# Patient Record
Sex: Female | Born: 1940 | Race: White | Hispanic: No | Smoking: Former smoker
Health system: Southern US, Community
[De-identification: ages and names within clinical notes are randomized; demographics above are authoritative.]

## PROBLEM LIST (undated history)

## (undated) DIAGNOSIS — L409 Psoriasis, unspecified: Secondary | ICD-10-CM

## (undated) DIAGNOSIS — E785 Hyperlipidemia, unspecified: Secondary | ICD-10-CM

## (undated) DIAGNOSIS — R945 Abnormal results of liver function studies: Secondary | ICD-10-CM

## (undated) DIAGNOSIS — K8020 Calculus of gallbladder without cholecystitis without obstruction: Secondary | ICD-10-CM

## (undated) DIAGNOSIS — I5032 Chronic diastolic (congestive) heart failure: Secondary | ICD-10-CM

## (undated) DIAGNOSIS — N390 Urinary tract infection, site not specified: Secondary | ICD-10-CM

## (undated) DIAGNOSIS — I2510 Atherosclerotic heart disease of native coronary artery without angina pectoris: Secondary | ICD-10-CM

## (undated) DIAGNOSIS — I252 Old myocardial infarction: Secondary | ICD-10-CM

## (undated) DIAGNOSIS — M810 Age-related osteoporosis without current pathological fracture: Secondary | ICD-10-CM

## (undated) DIAGNOSIS — J189 Pneumonia, unspecified organism: Secondary | ICD-10-CM

## (undated) DIAGNOSIS — I255 Ischemic cardiomyopathy: Secondary | ICD-10-CM

## (undated) DIAGNOSIS — I10 Essential (primary) hypertension: Secondary | ICD-10-CM

## (undated) HISTORY — DX: Old myocardial infarction: I25.2

## (undated) HISTORY — DX: Hyperlipidemia, unspecified: E78.5

## (undated) HISTORY — DX: Ischemic cardiomyopathy: I25.5

## (undated) HISTORY — DX: Pneumonia, unspecified organism: J18.9

## (undated) HISTORY — DX: Calculus of gallbladder without cholecystitis without obstruction: K80.20

## (undated) HISTORY — DX: Urinary tract infection, site not specified: N39.0

## (undated) HISTORY — DX: Abnormal results of liver function studies: R94.5

## (undated) HISTORY — DX: Atherosclerotic heart disease of native coronary artery without angina pectoris: I25.10

## (undated) HISTORY — DX: Chronic diastolic (congestive) heart failure: I50.32

## (undated) HISTORY — DX: Age-related osteoporosis without current pathological fracture: M81.0

## (undated) HISTORY — PX: CORONARY ARTERY BYPASS GRAFT: SHX141

## (undated) HISTORY — PX: TONSILLECTOMY: SUR1361

## (undated) HISTORY — PX: CARPAL TUNNEL RELEASE: SHX101

---

## 1969-11-27 HISTORY — PX: TUBAL LIGATION: SHX77

## 2006-08-11 ENCOUNTER — Emergency Department (HOSPITAL_COMMUNITY): Admission: EM | Admit: 2006-08-11 | Discharge: 2006-08-11 | Payer: Self-pay | Admitting: Emergency Medicine

## 2008-11-26 HISTORY — PX: WRIST FRACTURE SURGERY: SHX121

## 2012-08-24 ENCOUNTER — Encounter (HOSPITAL_COMMUNITY): Payer: Self-pay

## 2012-08-24 ENCOUNTER — Emergency Department (HOSPITAL_COMMUNITY)
Admission: EM | Admit: 2012-08-24 | Discharge: 2012-08-24 | Disposition: A | Discharge status: Home / Self Care | Payer: 59 | Source: Home / Self Care

## 2012-08-24 DIAGNOSIS — T148 Other injury of unspecified body region: Secondary | ICD-10-CM

## 2012-08-24 DIAGNOSIS — W57XXXA Bitten or stung by nonvenomous insect and other nonvenomous arthropods, initial encounter: Secondary | ICD-10-CM

## 2012-08-24 HISTORY — DX: Psoriasis, unspecified: L40.9

## 2012-08-24 MED ORDER — DOXYCYCLINE HYCLATE 100 MG PO CAPS: 100.0000 mg | ORAL_CAPSULE | Freq: Two times a day (BID) | ORAL | Status: DC

## 2012-08-24 NOTE — ED Provider Notes (Signed)
History     CSN: [id number]  Arrival date & time 08/24/12  1835   None     No chief complaint on file.   (Consider location/radiation/quality/duration/timing/severity/associated sxs/prior treatment) Patient is a 71 y.o. female presenting with rash. The history is provided by the patient. No language interpreter was used.  Rash  This is a new problem. The current episode started more than 1 week ago. The problem has been gradually worsening. Associated with: tick. The maximum temperature recorded prior to her arrival was 100 to 100.9 F. The rash is present on the trunk. The pain is moderate. The pain has been constant since onset. Associated symptoms include itching.  Pt pulled a tick off of herself.  Pt now has a red, bullseye looking rash at the area.   Pt was in [place] in August.  Dermatologist gave her a steroid cream for area but now she has a headache.  Pt reports fever yesterday  No past medical history on file.  No past surgical history on file.  No family history on file.  History  Substance Use Topics  . Smoking status: Not on file  . Smokeless tobacco: Not on file  . Alcohol Use: Not on file    OB History    No data available      Review of Systems  Skin: Positive for itching and rash.  All other systems reviewed and are negative.    Allergies  Review of patient's allergies indicates not on file.  Home Medications  No current outpatient prescriptions on file.  BP 159/93  Pulse 104  Temp 100.4 F (38 C) (Oral)  Resp 22  SpO2 98%  Physical Exam  Nursing note and vitals reviewed. Constitutional: She appears well-developed and well-nourished.  HENT:  Head: Normocephalic and atraumatic.  Neck: Normal range of motion.  Cardiovascular: Normal rate.   Pulmonary/Chest: Effort normal.  Musculoskeletal: Normal range of motion.  Neurological: She is alert.  Skin: Rash noted.       Red area under left breast,  Clearing circular pattern.  Psychiatric:  She has a normal mood and affect.    ED Course  Procedures (including critical care time)  Labs Reviewed - No data to display No results found.   1. Tick bite    Clearing could be from steroid cream.   I will treat with Doxycycline.  (no interference with metotrexate per hospital pharmacist.  RMSF and lyme titer ordered.   Pt does not have a primary care MD.  Pt given referrals by RN   MDM          Elson Areas, PA 08/24/12 1935

## 2012-08-24 NOTE — ED Notes (Signed)
States she noted a tick attached to her ant chest wall ~2 weeks ago, and since 2 days ago, has developed bulls eye-type lesion at bite site. Concern for tick borne illness

## 2012-08-25 NOTE — ED Provider Notes (Signed)
Medical screening examination/treatment/procedure(s) were performed by resident physician or non-physician practitioner and as supervising physician I was immediately available for consultation/collaboration.   KINDL,JAMES DOUGLAS MD.    James D Kindl, MD 08/25/12 1911 

## 2012-08-26 LAB — ROCKY MTN SPOTTED FVR AB, IGG-BLOOD: RMSF IgG: 1.62 IV — ABNORMAL HIGH

## 2012-08-26 LAB — B. BURGDORFI ANTIBODIES: B burgdorferi Ab IgG+IgM: 0.39 {ISR}

## 2012-08-28 ENCOUNTER — Telehealth (HOSPITAL_COMMUNITY): Payer: Self-pay | Admitting: *Deleted

## 2012-08-28 NOTE — ED Notes (Signed)
Patient called regarding her lab reports, left message on clinic answering machine. After retrieving reports and discussion w Langston Masker PA ( provider on her visit) returned call and discussed positive RMSF report, negative Lyme disease report. Was advised to continue her antibiotics as prescribed , and should be rechecked if her symptoms worsen or if new symptoms develop. Was also advised to continue in her attempts to acquire a new primary care provider for her ongoing health issues. Patient verbalized agreement and understanding of this plan

## 2012-08-28 NOTE — ED Notes (Signed)
RMSF 1.62 H pos., B. Burgdorfi IgG and IgM 0.39 neg.  Pt. adequately treated with Doxycycline.  Lab shown to [employer] PA.  She said to notify pt. To f/u with PCP or her dermatologist in 2 weeks to get titer rechecked.  If no PCP she can try Dr. Larita Fife at [facility] or Dr. Foy Guadalajara in [place]. I called pt.  Pt. verified x 2 and given results.  Pt. given instructions and voiced understanding.

## 2015-05-22 ENCOUNTER — Emergency Department (HOSPITAL_COMMUNITY): Payer: Medicare Other

## 2015-05-22 ENCOUNTER — Encounter (HOSPITAL_COMMUNITY)
Admission: EM | Disposition: A | Discharge status: Home Health | Payer: Medicare Other | Source: Home / Self Care | Attending: Thoracic Surgery (Cardiothoracic Vascular Surgery)

## 2015-05-22 ENCOUNTER — Inpatient Hospital Stay (HOSPITAL_COMMUNITY)
Admission: EM | Admit: 2015-05-22 | Discharge: 2015-05-28 | DRG: 233 | Disposition: A | Discharge status: Home Health | Payer: Medicare Other | Attending: Thoracic Surgery (Cardiothoracic Vascular Surgery) | Admitting: Thoracic Surgery (Cardiothoracic Vascular Surgery)

## 2015-05-22 ENCOUNTER — Encounter (HOSPITAL_COMMUNITY): Payer: Self-pay | Admitting: *Deleted

## 2015-05-22 DIAGNOSIS — I252 Old myocardial infarction: Secondary | ICD-10-CM | POA: Diagnosis present

## 2015-05-22 DIAGNOSIS — Z882 Allergy status to sulfonamides status: Secondary | ICD-10-CM

## 2015-05-22 DIAGNOSIS — D62 Acute posthemorrhagic anemia: Secondary | ICD-10-CM | POA: Diagnosis not present

## 2015-05-22 DIAGNOSIS — Z79899 Other long term (current) drug therapy: Secondary | ICD-10-CM

## 2015-05-22 DIAGNOSIS — Z8249 Family history of ischemic heart disease and other diseases of the circulatory system: Secondary | ICD-10-CM

## 2015-05-22 DIAGNOSIS — R000 Tachycardia, unspecified: Secondary | ICD-10-CM | POA: Diagnosis present

## 2015-05-22 DIAGNOSIS — I5041 Acute combined systolic (congestive) and diastolic (congestive) heart failure: Secondary | ICD-10-CM | POA: Diagnosis present

## 2015-05-22 DIAGNOSIS — J90 Pleural effusion, not elsewhere classified: Secondary | ICD-10-CM

## 2015-05-22 DIAGNOSIS — I2510 Atherosclerotic heart disease of native coronary artery without angina pectoris: Secondary | ICD-10-CM | POA: Diagnosis present

## 2015-05-22 DIAGNOSIS — I2102 ST elevation (STEMI) myocardial infarction involving left anterior descending coronary artery: Secondary | ICD-10-CM | POA: Diagnosis not present

## 2015-05-22 DIAGNOSIS — L409 Psoriasis, unspecified: Secondary | ICD-10-CM | POA: Diagnosis present

## 2015-05-22 DIAGNOSIS — Z951 Presence of aortocoronary bypass graft: Secondary | ICD-10-CM

## 2015-05-22 DIAGNOSIS — Z87891 Personal history of nicotine dependence: Secondary | ICD-10-CM

## 2015-05-22 DIAGNOSIS — Z7982 Long term (current) use of aspirin: Secondary | ICD-10-CM

## 2015-05-22 DIAGNOSIS — E876 Hypokalemia: Secondary | ICD-10-CM | POA: Diagnosis not present

## 2015-05-22 DIAGNOSIS — I10 Essential (primary) hypertension: Secondary | ICD-10-CM | POA: Diagnosis present

## 2015-05-22 DIAGNOSIS — I213 ST elevation (STEMI) myocardial infarction of unspecified site: Secondary | ICD-10-CM

## 2015-05-22 DIAGNOSIS — J9811 Atelectasis: Secondary | ICD-10-CM

## 2015-05-22 DIAGNOSIS — R079 Chest pain, unspecified: Secondary | ICD-10-CM | POA: Diagnosis not present

## 2015-05-22 HISTORY — PX: CARDIAC CATHETERIZATION: SHX172

## 2015-05-22 HISTORY — DX: Essential (primary) hypertension: I10

## 2015-05-22 HISTORY — PX: TEE WITHOUT CARDIOVERSION: SHX5443

## 2015-05-22 HISTORY — DX: Old myocardial infarction: I25.2

## 2015-05-22 HISTORY — PX: CORONARY ARTERY BYPASS GRAFT: SHX141

## 2015-05-22 LAB — BASIC METABOLIC PANEL
ANION GAP: 12 (ref 5–15)
BUN: 15 mg/dL (ref 6–20)
CO2: 26 mmol/L (ref 22–32)
Calcium: 9.2 mg/dL (ref 8.9–10.3)
Chloride: 102 mmol/L (ref 101–111)
Creatinine, Ser: 0.95 mg/dL (ref 0.44–1.00)
GFR calc Af Amer: >60 mL/min (ref >60)
GFR calc non Af Amer: 58 mL/min — ABNORMAL LOW (ref >60)
Glucose, Bld: 105 mg/dL — ABNORMAL HIGH (ref 65–99)
Potassium: 3.2 mmol/L — ABNORMAL LOW (ref 3.5–5.1)
Sodium: 140 mmol/L (ref 135–145)

## 2015-05-22 LAB — CBC
HEMATOCRIT: 37.7 % (ref 36.0–46.0)
HEMATOCRIT: 40.4 % (ref 36.0–46.0)
Hemoglobin: 12.4 g/dL (ref 12.0–15.0)
Hemoglobin: 13.1 g/dL (ref 12.0–15.0)
MCH: 26.9 pg (ref 26.0–34.0)
MCH: 27.1 pg (ref 26.0–34.0)
MCHC: 32.4 g/dL (ref 30.0–36.0)
MCHC: 32.9 g/dL (ref 30.0–36.0)
MCV: 82.3 fL (ref 78.0–100.0)
MCV: 83 fL (ref 78.0–100.0)
Platelets: 260 10*3/uL (ref 150–400)
Platelets: 281 10*3/uL (ref 150–400)
RBC: 4.58 MIL/uL (ref 3.87–5.11)
RBC: 4.87 MIL/uL (ref 3.87–5.11)
RDW: 12.9 % (ref 11.5–15.5)
RDW: 12.9 % (ref 11.5–15.5)
WBC: 7 10*3/uL (ref 4.0–10.5)
WBC: 8.3 10*3/uL (ref 4.0–10.5)

## 2015-05-22 LAB — I-STAT TROPONIN, ED: TROPONIN I, POC: 0.01 ng/mL (ref 0.00–0.08)

## 2015-05-22 LAB — TROPONIN I: Troponin I: <0.03 ng/mL (ref <0.031)

## 2015-05-22 SURGERY — LEFT HEART CATH AND CORONARY ANGIOGRAPHY

## 2015-05-22 SURGERY — CORONARY ARTERY BYPASS GRAFTING (CABG)
Anesthesia: General | Site: Chest

## 2015-05-22 MED ORDER — VERAPAMIL HCL 2.5 MG/ML IV SOLN
INTRAVENOUS | Status: AC
  Filled 2015-05-22: qty 2

## 2015-05-22 MED ORDER — METOPROLOL TARTRATE 1 MG/ML IV SOLN
INTRAVENOUS | Status: DC | PRN
  Administered 2015-05-22 (×2): 5 mg via INTRAVENOUS

## 2015-05-22 MED ORDER — IOHEXOL 350 MG/ML SOLN
INTRAVENOUS | Status: DC | PRN
  Administered 2015-05-22: 110 mL via INTRA_ARTERIAL

## 2015-05-22 MED ORDER — SODIUM CHLORIDE 0.9 % IV SOLN
250.0000 mg | INTRAVENOUS | Status: DC | PRN
  Administered 2015-05-22: 1.75 mg/kg/h via INTRAVENOUS

## 2015-05-22 MED ORDER — FENTANYL CITRATE (PF) 100 MCG/2ML IJ SOLN
INTRAMUSCULAR | Status: DC | PRN
  Administered 2015-05-22: 50 ug via INTRAVENOUS

## 2015-05-22 MED ORDER — NITROGLYCERIN IN D5W 200-5 MCG/ML-% IV SOLN
10.0000 ug/min | INTRAVENOUS | Status: DC
  Administered 2015-05-22: 5 ug/min via INTRAVENOUS
  Filled 2015-05-22: qty 250

## 2015-05-22 MED ORDER — METOPROLOL TARTRATE 1 MG/ML IV SOLN
INTRAVENOUS | Status: AC
  Filled 2015-05-22: qty 10

## 2015-05-22 MED ORDER — BIVALIRUDIN 250 MG IV SOLR
INTRAVENOUS | Status: AC
  Filled 2015-05-22: qty 250

## 2015-05-22 MED ORDER — VERAPAMIL HCL 2.5 MG/ML IV SOLN
INTRAVENOUS | Status: DC | PRN
  Administered 2015-05-22: 23:00:00 via INTRA_ARTERIAL

## 2015-05-22 MED ORDER — LIDOCAINE HCL (PF) 1 % IJ SOLN
INTRAMUSCULAR | Status: AC
  Filled 2015-05-22: qty 30

## 2015-05-22 MED ORDER — HEPARIN (PORCINE) IN NACL 2-0.9 UNIT/ML-% IJ SOLN
INTRAMUSCULAR | Status: AC
  Filled 2015-05-22: qty 1500

## 2015-05-22 MED ORDER — FENTANYL CITRATE (PF) 100 MCG/2ML IJ SOLN
INTRAMUSCULAR | Status: AC
  Filled 2015-05-22: qty 2

## 2015-05-22 MED ORDER — MIDAZOLAM HCL 2 MG/2ML IJ SOLN
INTRAMUSCULAR | Status: AC
  Filled 2015-05-22: qty 2

## 2015-05-22 MED ORDER — ASPIRIN 81 MG PO CHEW
324.0000 mg | CHEWABLE_TABLET | Freq: Once | ORAL | Status: AC
  Administered 2015-05-22: 324 mg via ORAL
  Filled 2015-05-22: qty 4

## 2015-05-22 MED ORDER — HEPARIN SODIUM (PORCINE) 5000 UNIT/ML IJ SOLN
4000.0000 [IU] | Freq: Once | INTRAMUSCULAR | Status: AC
  Administered 2015-05-22: 4000 [IU] via INTRAVENOUS

## 2015-05-22 MED ORDER — MIDAZOLAM HCL 2 MG/2ML IJ SOLN
INTRAMUSCULAR | Status: DC | PRN
  Administered 2015-05-22 (×2): 1 mg via INTRAVENOUS

## 2015-05-22 MED ORDER — HEPARIN SODIUM (PORCINE) 5000 UNIT/ML IJ SOLN
INTRAMUSCULAR | Status: AC
  Filled 2015-05-22: qty 1

## 2015-05-22 MED ORDER — BIVALIRUDIN BOLUS VIA INFUSION - CUPID
INTRAVENOUS | Status: DC | PRN
  Administered 2015-05-22: 45.225 mg via INTRAVENOUS

## 2015-05-22 MED ORDER — NITROGLYCERIN 1 MG/10 ML FOR IR/CATH LAB
INTRA_ARTERIAL | Status: AC
  Filled 2015-05-22: qty 10

## 2015-05-22 MED ORDER — LIDOCAINE HCL (PF) 1 % IJ SOLN
INTRAMUSCULAR | Status: DC | PRN
  Administered 2015-05-22: 5 mL via SUBCUTANEOUS

## 2015-05-22 SURGICAL SUPPLY — 77 items
BAG DECANTER FOR FLEXI CONT (MISCELLANEOUS) ×3 IMPLANT
BANDAGE ELASTIC 4 VELCRO ST LF (GAUZE/BANDAGES/DRESSINGS) ×3 IMPLANT
BANDAGE ELASTIC 6 VELCRO ST LF (GAUZE/BANDAGES/DRESSINGS) ×3 IMPLANT
BASKET HEART (ORDER IN 25'S) (MISCELLANEOUS) ×1
BASKET HEART (ORDER IN 25S) (MISCELLANEOUS) ×2 IMPLANT
BLADE STERNUM SYSTEM 6 (BLADE) ×3 IMPLANT
BNDG GAUZE ELAST 4 BULKY (GAUZE/BANDAGES/DRESSINGS) ×3 IMPLANT
CANISTER SUCTION 2500CC (MISCELLANEOUS) ×3 IMPLANT
CANNULA EZ GLIDE AORTIC 21FR (CANNULA) ×3 IMPLANT
CATH CPB KIT HENDRICKSON (MISCELLANEOUS) ×3 IMPLANT
CATH ROBINSON RED A/P 18FR (CATHETERS) ×3 IMPLANT
CATH THORACIC 36FR (CATHETERS) ×3 IMPLANT
CATH THORACIC 36FR RT ANG (CATHETERS) ×3 IMPLANT
CLIP TI MEDIUM 24 (CLIP) IMPLANT
CLIP TI WIDE RED SMALL 24 (CLIP) ×6 IMPLANT
CRADLE DONUT ADULT HEAD (MISCELLANEOUS) ×3 IMPLANT
DRAPE CARDIOVASCULAR INCISE (DRAPES) ×1
DRAPE SLUSH/WARMER DISC (DRAPES) ×3 IMPLANT
DRAPE SRG 135X102X78XABS (DRAPES) ×2 IMPLANT
DRSG COVADERM 4X14 (GAUZE/BANDAGES/DRESSINGS) ×3 IMPLANT
ELECT REM PT RETURN 9FT ADLT (ELECTROSURGICAL) ×6
ELECTRODE REM PT RTRN 9FT ADLT (ELECTROSURGICAL) ×4 IMPLANT
GAUZE SPONGE 4X4 12PLY STRL (GAUZE/BANDAGES/DRESSINGS) ×6 IMPLANT
GLOVE SURG SIGNA 7.5 PF LTX (GLOVE) ×9 IMPLANT
GOWN STRL REUS W/ TWL LRG LVL3 (GOWN DISPOSABLE) ×8 IMPLANT
GOWN STRL REUS W/ TWL XL LVL3 (GOWN DISPOSABLE) ×4 IMPLANT
GOWN STRL REUS W/TWL LRG LVL3 (GOWN DISPOSABLE) ×4
GOWN STRL REUS W/TWL XL LVL3 (GOWN DISPOSABLE) ×2
HEMOSTAT POWDER SURGIFOAM 1G (HEMOSTASIS) ×9 IMPLANT
HEMOSTAT SURGICEL 2X14 (HEMOSTASIS) ×3 IMPLANT
INSERT FOGARTY XLG (MISCELLANEOUS) IMPLANT
KIT BASIN OR (CUSTOM PROCEDURE TRAY) ×3 IMPLANT
KIT ROOM TURNOVER OR (KITS) ×3 IMPLANT
KIT SUCTION CATH 14FR (SUCTIONS) ×6 IMPLANT
KIT VASOVIEW W/TROCAR VH 2000 (KITS) ×3 IMPLANT
MARKER GRAFT CORONARY BYPASS (MISCELLANEOUS) ×9 IMPLANT
NS IRRIG 1000ML POUR BTL (IV SOLUTION) ×15 IMPLANT
PACK OPEN HEART (CUSTOM PROCEDURE TRAY) ×3 IMPLANT
PAD ARMBOARD 7.5X6 YLW CONV (MISCELLANEOUS) ×6 IMPLANT
PAD ELECT DEFIB RADIOL ZOLL (MISCELLANEOUS) ×3 IMPLANT
PENCIL BUTTON HOLSTER BLD 10FT (ELECTRODE) ×3 IMPLANT
PUNCH AORTIC ROTATE 4.0MM (MISCELLANEOUS) IMPLANT
PUNCH AORTIC ROTATE 4.5MM 8IN (MISCELLANEOUS) IMPLANT
PUNCH AORTIC ROTATE 5MM 8IN (MISCELLANEOUS) IMPLANT
SET CARDIOPLEGIA MPS 5001102 (MISCELLANEOUS) ×3 IMPLANT
SPONGE GAUZE 4X4 12PLY STER LF (GAUZE/BANDAGES/DRESSINGS) ×6 IMPLANT
SUT BONE WAX W31G (SUTURE) ×3 IMPLANT
SUT MNCRL AB 4-0 PS2 18 (SUTURE) ×6 IMPLANT
SUT PROLENE 3 0 SH DA (SUTURE) ×3 IMPLANT
SUT PROLENE 4 0 RB 1 (SUTURE)
SUT PROLENE 4 0 SH DA (SUTURE) IMPLANT
SUT PROLENE 4-0 RB1 .5 CRCL 36 (SUTURE) IMPLANT
SUT PROLENE 6 0 C 1 30 (SUTURE) ×6 IMPLANT
SUT PROLENE 7 0 BV1 MDA (SUTURE) ×3 IMPLANT
SUT PROLENE 8 0 BV175 6 (SUTURE) IMPLANT
SUT STEEL 6MS V (SUTURE) ×3 IMPLANT
SUT STEEL STERNAL CCS#1 18IN (SUTURE) IMPLANT
SUT STEEL SZ 6 DBL 3X14 BALL (SUTURE) ×3 IMPLANT
SUT VIC AB 1 CTX 36 (SUTURE) ×2
SUT VIC AB 1 CTX36XBRD ANBCTR (SUTURE) ×4 IMPLANT
SUT VIC AB 2-0 CT1 27 (SUTURE) ×2
SUT VIC AB 2-0 CT1 TAPERPNT 27 (SUTURE) ×4 IMPLANT
SUT VIC AB 2-0 CTX 27 (SUTURE) IMPLANT
SUT VIC AB 3-0 SH 27 (SUTURE)
SUT VIC AB 3-0 SH 27X BRD (SUTURE) IMPLANT
SUT VIC AB 3-0 X1 27 (SUTURE) IMPLANT
SUT VICRYL 4-0 PS2 18IN ABS (SUTURE) IMPLANT
SUTURE E-PAK OPEN HEART (SUTURE) ×3 IMPLANT
SYSTEM SAHARA CHEST DRAIN ATS (WOUND CARE) ×3 IMPLANT
TAPE CLOTH SURG 4X10 WHT LF (GAUZE/BANDAGES/DRESSINGS) ×3 IMPLANT
TOWEL OR 17X24 6PK STRL BLUE (TOWEL DISPOSABLE) ×6 IMPLANT
TOWEL OR 17X26 10 PK STRL BLUE (TOWEL DISPOSABLE) ×6 IMPLANT
TRAY FOLEY IC TEMP SENS 16FR (CATHETERS) ×3 IMPLANT
TUBE FEEDING 8FR 16IN STR KANG (MISCELLANEOUS) ×3 IMPLANT
TUBING INSUFFLATION (TUBING) ×3 IMPLANT
UNDERPAD 30X30 INCONTINENT (UNDERPADS AND DIAPERS) ×3 IMPLANT
WATER STERILE IRR 1000ML POUR (IV SOLUTION) ×6 IMPLANT

## 2015-05-22 SURGICAL SUPPLY — 12 items
CATH INFINITI 5 FR JL3.5 (CATHETERS) ×3 IMPLANT
CATH INFINITI 5FR ANG PIGTAIL (CATHETERS) ×3 IMPLANT
CATH INFINITI JR4 5F (CATHETERS) ×3 IMPLANT
GLIDESHEATH SLEND A-KIT 6F 22G (SHEATH) ×3 IMPLANT
KIT ENCORE 26 ADVANTAGE (KITS) ×3 IMPLANT
KIT HEART LEFT (KITS) ×3 IMPLANT
PACK CARDIAC CATHETERIZATION (CUSTOM PROCEDURE TRAY) ×3 IMPLANT
SYR MEDRAD MARK V 150ML (SYRINGE) ×3 IMPLANT
TRANSDUCER W/STOPCOCK (MISCELLANEOUS) ×3 IMPLANT
TUBING CIL FLEX 10 FLL-RA (TUBING) ×3 IMPLANT
WIRE HI TORQ VERSACORE-J 145CM (WIRE) ×3 IMPLANT
WIRE SAFE-T 1.5MM-J .035X260CM (WIRE) ×3 IMPLANT

## 2015-05-22 NOTE — ED Notes (Signed)
Cardiology at bedside.  Transport to cath lab.

## 2015-05-22 NOTE — ED Provider Notes (Signed)
CSN: [id number]     Arrival date & time 05/22/15  2210 History   First MD Initiated Contact with Patient 05/22/15 2220     Chief Complaint  Patient presents with  . Chest Pain     (Consider location/radiation/quality/duration/timing/severity/associated sxs/prior Treatment) Patient is a 74 y.o. female presenting with chest pain. The history is provided by the patient.  Chest Pain Pain location:  Substernal area Pain quality: pressure   Pain radiates to:  L shoulder and L arm Pain radiates to the back: no   Pain severity:  Moderate Onset quality:  Gradual Timing:  Constant Progression:  Unchanged Chronicity:  New Context: at rest   Relieved by:  Nothing Worsened by:  Nothing tried Associated symptoms: no cough, no fever and no shortness of breath     Past Medical History  Diagnosis Date  . Psoriasis    Past Surgical History  Procedure Laterality Date  . Tubal ligation    . Carpal tunnel release    . Tonsillectomy     History reviewed. No pertinent family history. History  Substance Use Topics  . Smoking status: Never Smoker   . Smokeless tobacco: Not on file  . Alcohol Use: No   OB History    No data available     Review of Systems  Constitutional: Negative for fever.  Respiratory: Negative for cough and shortness of breath.   Cardiovascular: Positive for chest pain.  All other systems reviewed and are negative.     Allergies  Avelox; Codeine; and Sulfa antibiotics  Home Medications   Prior to Admission medications   Medication Sig Start Date End Date Taking? Authorizing Provider  doxycycline (VIBRAMYCIN) 100 MG capsule Take 1 capsule (100 mg total) by mouth 2 (two) times daily. 08/24/12   Elson Areas, PA-C  methotrexate (RHEUMATREX) 10 MG tablet Take 10 mg by mouth. Caution: Chemotherapy. Protect from light.    Historical Provider, MD  methotrexate (RHEUMATREX) 2.5 MG tablet Take 2.5 mg by mouth once a week. Caution:Chemotherapy. Protect from light.     Historical Provider, MD   BP 186/103 mmHg  Pulse 106  Resp 30  Ht  (1.575 m)  Wt 133 lb (60.328 kg)  BMI 24.32 kg/m2  SpO2 100% Physical Exam  Constitutional: She is oriented to person, place, and time. She appears well-developed and well-nourished. No distress.  HENT:  Head: Normocephalic and atraumatic.  Mouth/Throat: Oropharynx is clear and moist.  Eyes: EOM are normal. Pupils are equal, round, and reactive to light.  Neck: Normal range of motion. Neck supple.  Cardiovascular: Normal rate and regular rhythm.  Exam reveals no friction rub.   No murmur heard. Pulmonary/Chest: Effort normal and breath sounds normal. No respiratory distress. She has no wheezes. She has no rales.  Abdominal: Soft. She exhibits no distension. There is no tenderness. There is no rebound.  Musculoskeletal: Normal range of motion. She exhibits no edema.  Neurological: She is alert and oriented to person, place, and time.  Skin: No rash noted. She is not diaphoretic.  Nursing note and vitals reviewed.   ED Course  Procedures (including critical care time) Labs Review Labs Reviewed  CBC  BASIC METABOLIC PANEL  TROPONIN I  I-STAT TROPOININ, ED  I-STAT TROPOININ, ED    Imaging Review No results found.   EKG Interpretation   Date/Time:  Saturday May 22 2015 22:13:46 EDT Ventricular Rate:  103 PR Interval:  164 QRS Duration: 94 QT Interval:  358 QTC Calculation: 468  R Axis:   70 Text Interpretation:  Sinus tachycardia ST elevation, consider early  repolarization, pericarditis, or injury Abnormal ECG No prior for  comparison Confirmed by [facility]  MD, Farris Geiman ([id number]) on 05/22/2015 10:21:08 PM      MDM   Final diagnoses:  ST elevation myocardial infarction (STEMI), unspecified artery    2226 - Patient here with chest pain, pressure-like - radiating to her L lateral chest. Associated SOB. No cardiac history. Initial EKG with concern for possible lateral elevation. Repeat 6 minutes  later read as lateral infarction. With her story c/w ACS and I and aVL elevation, CODE STEMI called. Aspirin and heparin given.  Patient feeling much better after heparin and nitroglycerin.  Patient taken to cath lab with Cardiology.  Elwin Mocha, MD 05/22/15 [phone]

## 2015-05-22 NOTE — ED Notes (Signed)
[facility] has been called for stemi.

## 2015-05-22 NOTE — ED Notes (Signed)
EKG Shown to Dr. Micheline Maze, and Dr. Gwendolyn Grant. Repeat EKG requested by Dr. Gwendolyn Grant.

## 2015-05-22 NOTE — H&P (Signed)
CARDIOLOGY INPATIENT HISTORY AND PHYSICAL EXAMINATION NOTE  Patient ID: Yareth Kearse MRN: [id number], DOB/AGE: 07-03-1941   Admit date: 05/22/2015   Primary Physician: No primary care provider on file. Primary Cardiologist: none Primary care physician: Dr. Cathey Endow  Reason for admission: Chest pain/anterolateral STEMI  HPI: This is a 74 y.o. female with no history of CAD but has hypertension, psoriasis, former tobacco abuse (quit 14 y ago 40 pack years) who presented with chest pain. The chest pain started 1 hour ago when patient folded a twin bed. In the afternoon around 5pm, she was having some tiredness and felt very fatigued. The chest pain is dull and hard, present int he center of the chest, with radiation to the left arm and is associated with diaphoresis and SOB. patietn denied any priro MIs. She is able to walk 1 mile every day and does not suffer from any pain. She has been on humira in the past and stopped for psoriasis 6 months ago. She is allergic to sulfa and codeine/avelox. She is currently on methotrexate.  In the Ed, the vitals were: BP 183/113 mmHg  Pulse 96  Resp 23  Ht  (1.575 m)  Wt 60.328 kg (133 lb)  BMI 24.32 kg/m2  SpO2 97% patient had ST elevation in AVL for 0.5 ml. Otherwise she was hemodynamically stable. She has been former Engineer, civil (consulting) in the OR. Her daughter is accompanying her. She performs her own ADLs and daughter lives with her.  Patient had prior bilateral wrist fractures.    Problem List: Past Medical History  Diagnosis Date  . Psoriasis   . Essential hypertension     Past Surgical History  Procedure Laterality Date  . Tubal ligation    . Carpal tunnel release    . Tonsillectomy       Allergies:  Allergies  Allergen Reactions  . Avelox [Moxifloxacin]   . Codeine   . Sulfa Antibiotics      Home Medications Current Facility-Administered Medications  Medication Dose Route Frequency Provider Last Rate Last Dose  . bivalirudin (ANGIOMAX)  250 mg in sodium chloride 0.9 % 50 mL (5 mg/mL) infusion  250 mg  Continuous PRN Lyn Records, MD 21.1 mL/hr at 05/22/15 2317 1.75 mg/kg/hr at 05/22/15 2317  . bivalirudin (ANGIOMAX) LOAD via infusion    PRN Lyn Records, MD   45.225 mg at 05/22/15 2315  . fentaNYL (SUBLIMAZE) injection    PRN Lyn Records, MD   50 mcg at 05/22/15 2308  . heparin 5000 UNIT/ML injection           . lidocaine (PF) (XYLOCAINE) 1 % injection    PRN Lyn Records, MD   5 mL at 05/22/15 2313  . metoprolol (LOPRESSOR) injection    PRN Lyn Records, MD   5 mg at 05/22/15 2337  . midazolam (VERSED) injection    PRN Lyn Records, MD   1 mg at 05/22/15 2314  . [MAR Hold] nitroGLYCERIN 50 mg in dextrose 5 % 250 mL (0.2 mg/mL) infusion  10-200 mcg/min Intravenous Titrated Elwin Mocha, MD 15 mL/hr at 05/22/15 2336 50 mcg/min at 05/22/15 2336  . Radial Cocktail    PRN Lyn Records, MD         Family History  Problem Relation Age of Onset  . Heart attack      Grandfather  . Heart disease Father      History   Social History  . Marital Status: Married  Spouse Name: N/A  . Number of Children: N/A  . Years of Education: N/A   Occupational History  . retired Engineer, civil (consulting)    Social History Main Topics  . Smoking status: Former Smoker    Quit date: 05/21/2001  . Smokeless tobacco: Never Used  . Alcohol Use: No  . Drug Use: No  . Sexual Activity: Not on file   Other Topics Concern  . Not on file   Social History Narrative   Lives with her daughter, takes care of her own ADLs and iADLs     Review of Systems: General: diaphoresis and fatigue  Cardiovascular: chest pain, dyspnea negative for dyspnea on exertion, edema, orthopnea, palpitations, paroxysmal nocturnal PND Dermatological: psoriatic rash Respiratory: negative for cough or wheezing Urologic: negative for hematuria Abdominal: negative for nausea, vomiting, diarrhea, bright red blood per rectum, melena, or hematemesis Neurologic: negative for  visual changes, syncope, or dizziness  Physical Exam: Vitals: BP 183/113 mmHg  Pulse 96  Resp 23  Ht 5\' 2"  (1.575 m)  Wt 60.328 kg (133 lb)  BMI 24.32 kg/m2  SpO2 97% General: not in acute distress Neck: JVP <8 cm, neck supple Heart: regular rate and rhythm, S1, S2, no murmurs/ has S3 Lungs: CTAB  GI: non tender, non distended, bowel sounds present Extremities: no edema Neuro: AAO x 3  Psych: normal affect, no anxiety Skin: psoriatic plaques all over the body with scaly erythmatous rash   Labs:   Results for orders placed or performed during the hospital encounter of 05/22/15 (from the past 24 hour(s))  CBC     Status: None   Collection Time: 05/22/15 10:19 PM  Result Value Ref Range   WBC 7.0 4.0 - 10.5 K/uL   RBC 4.87 3.87 - 5.11 MIL/uL   Hemoglobin 13.1 12.0 - 15.0 g/dL   HCT 59.9 35.7 - 01.7 %   MCV 83.0 78.0 - 100.0 fL   MCH 26.9 26.0 - 34.0 pg   MCHC 32.4 30.0 - 36.0 g/dL   RDW 79.3 90.3 - 00.9 %   Platelets 281 150 - 400 K/uL  Basic metabolic panel     Status: Abnormal   Collection Time: 05/22/15 10:19 PM  Result Value Ref Range   Sodium 140 135 - 145 mmol/L   Potassium 3.2 (L) 3.5 - 5.1 mmol/L   Chloride 102 101 - 111 mmol/L   CO2 26 22 - 32 mmol/L   Glucose, Bld 105 (H) 65 - 99 mg/dL   BUN 15 6 - 20 mg/dL   Creatinine, Ser 2.33 0.44 - 1.00 mg/dL   Calcium 9.2 8.9 - 00.7 mg/dL   GFR calc non Af Amer 58 (L) >60 mL/min   GFR calc Af Amer >60 >60 mL/min   Anion gap 12 5 - 15  Troponin I     Status: None   Collection Time: 05/22/15 10:19 PM  Result Value Ref Range   Troponin I <0.03 <0.031 ng/mL  I-stat troponin, ED  (not at [facility])     Status: None   Collection Time: 05/22/15 10:24 PM  Result Value Ref Range   Troponin i, poc 0.01 0.00 - 0.08 ng/mL   Comment 3             Radiology/Studies: No results found.  EKG: ST elevation 0.5 mm in lateral leads, reciprocal changes in inferior leads  Echo: NA  Cardiac cath: NA  Medical decision making:   Discussed care with the patient Discussed care with the physician  on the phone Reviewed labs and imaging personally Reviewed prior records  ASSESSMENT AND PLAN:  This is a 74 y.o. female with no history of CAD but has hypertension, psoriasis, former tobacco abuse (quit 14 y ago 40 pack years) who presented with chest pain. Patient was given 4000 IU heparin, aspirin 325 mg prior to the procedure.   Chest pain/Lateral STEMI 1. Emergent cardiac catheterization and PCI 2. Risk stratify with HbA1c, lipid profile, TSH 3. Echocardiogram in the morning 4. Aspirin, antiplatelet, high dose statin and beta blocker if tolerated 5. Tobacco cessation counseling provided 6. Medication compliance emphasized to the patient  ACCELERATED HYPERTENSION Hydralazine prn NTG drip to control chest pain and BP Keep MAP 70 - 90  PSORIASIS No need for inpatient treatment   Signed, Joellyn Rued, MD MS 05/22/2015, 11:40 PM

## 2015-05-22 NOTE — ED Notes (Signed)
Repeat EKG shown to Dr. Gwendolyn Grant. Patient moved to Trauma A

## 2015-05-22 NOTE — ED Notes (Signed)
Pt in c/o sudden onset of central chest pain, pain radiates into her back and down her left arm, pt restless in triage, denies other symptoms, reports fatigue earlier in the night before the pain started

## 2015-05-23 ENCOUNTER — Inpatient Hospital Stay (HOSPITAL_COMMUNITY): Payer: Medicare Other

## 2015-05-23 ENCOUNTER — Inpatient Hospital Stay (INDEPENDENT_AMBULATORY_CARE_PROVIDER_SITE_OTHER): Payer: Medicare Other

## 2015-05-23 ENCOUNTER — Emergency Department (HOSPITAL_COMMUNITY): Payer: Medicare Other | Admitting: Anesthesiology

## 2015-05-23 ENCOUNTER — Encounter (HOSPITAL_COMMUNITY): Payer: Self-pay | Admitting: Anesthesiology

## 2015-05-23 DIAGNOSIS — I10 Essential (primary) hypertension: Secondary | ICD-10-CM

## 2015-05-23 DIAGNOSIS — L409 Psoriasis, unspecified: Secondary | ICD-10-CM | POA: Diagnosis present

## 2015-05-23 DIAGNOSIS — Z87891 Personal history of nicotine dependence: Secondary | ICD-10-CM | POA: Diagnosis not present

## 2015-05-23 DIAGNOSIS — I2102 ST elevation (STEMI) myocardial infarction involving left anterior descending coronary artery: Secondary | ICD-10-CM | POA: Diagnosis present

## 2015-05-23 DIAGNOSIS — Z882 Allergy status to sulfonamides status: Secondary | ICD-10-CM | POA: Diagnosis not present

## 2015-05-23 DIAGNOSIS — D62 Acute posthemorrhagic anemia: Secondary | ICD-10-CM | POA: Diagnosis not present

## 2015-05-23 DIAGNOSIS — I25110 Atherosclerotic heart disease of native coronary artery with unstable angina pectoris: Secondary | ICD-10-CM

## 2015-05-23 DIAGNOSIS — Z8249 Family history of ischemic heart disease and other diseases of the circulatory system: Secondary | ICD-10-CM | POA: Diagnosis not present

## 2015-05-23 DIAGNOSIS — I5041 Acute combined systolic (congestive) and diastolic (congestive) heart failure: Secondary | ICD-10-CM | POA: Diagnosis present

## 2015-05-23 DIAGNOSIS — I2510 Atherosclerotic heart disease of native coronary artery without angina pectoris: Secondary | ICD-10-CM

## 2015-05-23 DIAGNOSIS — Z951 Presence of aortocoronary bypass graft: Secondary | ICD-10-CM

## 2015-05-23 DIAGNOSIS — R079 Chest pain, unspecified: Secondary | ICD-10-CM | POA: Diagnosis present

## 2015-05-23 DIAGNOSIS — R000 Tachycardia, unspecified: Secondary | ICD-10-CM | POA: Diagnosis present

## 2015-05-23 DIAGNOSIS — E876 Hypokalemia: Secondary | ICD-10-CM | POA: Diagnosis not present

## 2015-05-23 DIAGNOSIS — Z7982 Long term (current) use of aspirin: Secondary | ICD-10-CM | POA: Diagnosis not present

## 2015-05-23 DIAGNOSIS — Z79899 Other long term (current) drug therapy: Secondary | ICD-10-CM | POA: Diagnosis not present

## 2015-05-23 DIAGNOSIS — J9811 Atelectasis: Secondary | ICD-10-CM | POA: Diagnosis not present

## 2015-05-23 LAB — POCT I-STAT, CHEM 8
BUN: 15 mg/dL (ref 6–20)
BUN: 11 mg/dL (ref 6–20)
BUN: 11 mg/dL (ref 6–20)
BUN: 14 mg/dL (ref 6–20)
BUN: 14 mg/dL (ref 6–20)
BUN: 11 mg/dL (ref 6–20)
BUN: 8 mg/dL (ref 6–20)
CALCIUM ION: 1.09 mmol/L — AB (ref 1.13–1.30)
CALCIUM ION: 0.91 mmol/L — AB (ref 1.13–1.30)
CALCIUM ION: 1.1 mmol/L — AB (ref 1.13–1.30)
CALCIUM ION: 0.94 mmol/L — AB (ref 1.13–1.30)
CALCIUM ION: 1.01 mmol/L — AB (ref 1.13–1.30)
CHLORIDE: 105 mmol/L (ref 101–111)
CHLORIDE: 106 mmol/L (ref 101–111)
CHLORIDE: 108 mmol/L (ref 101–111)
CREATININE: 0.5 mg/dL (ref 0.44–1.00)
CREATININE: 0.5 mg/dL (ref 0.44–1.00)
CREATININE: 0.5 mg/dL (ref 0.44–1.00)
Calcium, Ion: 0.86 mmol/L — ABNORMAL LOW (ref 1.13–1.30)
Calcium, Ion: 1.04 mmol/L — ABNORMAL LOW (ref 1.13–1.30)
Chloride: 105 mmol/L (ref 101–111)
Chloride: 100 mmol/L — ABNORMAL LOW (ref 101–111)
Chloride: 104 mmol/L (ref 101–111)
Chloride: 104 mmol/L (ref 101–111)
Creatinine, Ser: 0.6 mg/dL (ref 0.44–1.00)
Creatinine, Ser: 0.5 mg/dL (ref 0.44–1.00)
Creatinine, Ser: 0.6 mg/dL (ref 0.44–1.00)
Creatinine, Ser: 0.5 mg/dL (ref 0.44–1.00)
GLUCOSE: 178 mg/dL — AB (ref 65–99)
GLUCOSE: 156 mg/dL — AB (ref 65–99)
GLUCOSE: 136 mg/dL — AB (ref 65–99)
Glucose, Bld: 132 mg/dL — ABNORMAL HIGH (ref 65–99)
Glucose, Bld: 134 mg/dL — ABNORMAL HIGH (ref 65–99)
Glucose, Bld: 154 mg/dL — ABNORMAL HIGH (ref 65–99)
Glucose, Bld: 166 mg/dL — ABNORMAL HIGH (ref 65–99)
HCT: 22 % — ABNORMAL LOW (ref 36.0–46.0)
HCT: 30 % — ABNORMAL LOW (ref 36.0–46.0)
HCT: 23 % — ABNORMAL LOW (ref 36.0–46.0)
HCT: 30 % — ABNORMAL LOW (ref 36.0–46.0)
HCT: 32 % — ABNORMAL LOW (ref 36.0–46.0)
HEMATOCRIT: 32 % — AB (ref 36.0–46.0)
HEMATOCRIT: 20 % — AB (ref 36.0–46.0)
HEMOGLOBIN: 10.9 g/dL — AB (ref 12.0–15.0)
HEMOGLOBIN: 10.2 g/dL — AB (ref 12.0–15.0)
HEMOGLOBIN: 7.5 g/dL — AB (ref 12.0–15.0)
Hemoglobin: 6.8 g/dL — CL (ref 12.0–15.0)
Hemoglobin: 7.8 g/dL — ABNORMAL LOW (ref 12.0–15.0)
Hemoglobin: 10.2 g/dL — ABNORMAL LOW (ref 12.0–15.0)
Hemoglobin: 10.9 g/dL — ABNORMAL LOW (ref 12.0–15.0)
POTASSIUM: 2.7 mmol/L — AB (ref 3.5–5.1)
POTASSIUM: 2.8 mmol/L — AB (ref 3.5–5.1)
POTASSIUM: 4 mmol/L (ref 3.5–5.1)
POTASSIUM: 3.3 mmol/L — AB (ref 3.5–5.1)
Potassium: 3 mmol/L — ABNORMAL LOW (ref 3.5–5.1)
Potassium: 4.5 mmol/L (ref 3.5–5.1)
Potassium: 3.4 mmol/L — ABNORMAL LOW (ref 3.5–5.1)
SODIUM: 139 mmol/L (ref 135–145)
Sodium: 140 mmol/L (ref 135–145)
Sodium: 139 mmol/L (ref 135–145)
Sodium: 141 mmol/L (ref 135–145)
Sodium: 142 mmol/L (ref 135–145)
Sodium: 142 mmol/L (ref 135–145)
Sodium: 144 mmol/L (ref 135–145)
TCO2: 25 mmol/L (ref 0–100)
TCO2: 22 mmol/L (ref 0–100)
TCO2: 23 mmol/L (ref 0–100)
TCO2: 25 mmol/L (ref 0–100)
TCO2: 26 mmol/L (ref 0–100)
TCO2: 22 mmol/L (ref 0–100)
TCO2: 22 mmol/L (ref 0–100)

## 2015-05-23 LAB — PROTIME-INR
INR: 1.29 (ref 0.00–1.49)
INR: 1.31 (ref 0.00–1.49)
INR: 2.3 — ABNORMAL HIGH (ref 0.00–1.49)
PROTHROMBIN TIME: 25.1 s — AB (ref 11.6–15.2)
Prothrombin Time: 16.2 seconds — ABNORMAL HIGH (ref 11.6–15.2)
Prothrombin Time: 16.4 seconds — ABNORMAL HIGH (ref 11.6–15.2)

## 2015-05-23 LAB — HEMOGLOBIN AND HEMATOCRIT, BLOOD
HCT: 21 % — ABNORMAL LOW (ref 36.0–46.0)
Hemoglobin: 6.8 g/dL — CL (ref 12.0–15.0)

## 2015-05-23 LAB — BASIC METABOLIC PANEL
Anion gap: 6 (ref 5–15)
BUN: 10 mg/dL (ref 6–20)
CO2: 24 mmol/L (ref 22–32)
Calcium: 6.5 mg/dL — ABNORMAL LOW (ref 8.9–10.3)
Chloride: 111 mmol/L (ref 101–111)
Creatinine, Ser: 0.74 mg/dL (ref 0.44–1.00)
GFR calc Af Amer: >60 mL/min (ref >60)
Glucose, Bld: 138 mg/dL — ABNORMAL HIGH (ref 65–99)
POTASSIUM: 3.5 mmol/L (ref 3.5–5.1)
Sodium: 141 mmol/L (ref 135–145)

## 2015-05-23 LAB — POCT I-STAT 3, ART BLOOD GAS (G3+)
ACID-BASE DEFICIT: 2 mmol/L (ref 0.0–2.0)
ACID-BASE DEFICIT: 1 mmol/L (ref 0.0–2.0)
Acid-base deficit: 5 mmol/L — ABNORMAL HIGH (ref 0.0–2.0)
Acid-base deficit: 2 mmol/L (ref 0.0–2.0)
BICARBONATE: 24.7 meq/L — AB (ref 20.0–24.0)
Bicarbonate: 23.4 mEq/L (ref 20.0–24.0)
Bicarbonate: 21.9 mEq/L (ref 20.0–24.0)
Bicarbonate: 23 mEq/L (ref 20.0–24.0)
O2 SAT: 100 %
O2 SAT: 93 %
O2 SAT: 96 %
O2 Saturation: 93 %
PCO2 ART: 43.9 mmHg (ref 35.0–45.0)
PCO2 ART: 47.9 mmHg — AB (ref 35.0–45.0)
PCO2 ART: 43.9 mmHg (ref 35.0–45.0)
PH ART: 7.262 — AB (ref 7.350–7.450)
PH ART: 7.357 (ref 7.350–7.450)
PO2 ART: 65 mmHg — AB (ref 80.0–100.0)
Patient temperature: 35.9
Patient temperature: 36.7
TCO2: 25 mmol/L (ref 0–100)
TCO2: 23 mmol/L (ref 0–100)
TCO2: 26 mmol/L (ref 0–100)
TCO2: 24 mmol/L (ref 0–100)
pCO2 arterial: 39.2 mmHg (ref 35.0–45.0)
pH, Arterial: 7.335 — ABNORMAL LOW (ref 7.350–7.450)
pH, Arterial: 7.372 (ref 7.350–7.450)
pO2, Arterial: 288 mmHg — ABNORMAL HIGH (ref 80.0–100.0)
pO2, Arterial: 74 mmHg — ABNORMAL LOW (ref 80.0–100.0)
pO2, Arterial: 83 mmHg (ref 80.0–100.0)

## 2015-05-23 LAB — TYPE AND SCREEN
ABO/RH(D): A POS
Antibody Screen: NEGATIVE

## 2015-05-23 LAB — TROPONIN I
TROPONIN I: 0.26 ng/mL — AB (ref <0.031)
TROPONIN I: 4.32 ng/mL — AB (ref <0.031)
TROPONIN I: 5.18 ng/mL — AB (ref <0.031)
Troponin I: 6.2 ng/mL (ref <0.031)

## 2015-05-23 LAB — CK TOTAL AND CKMB (NOT AT ARMC)
CK, MB: 109.5 ng/mL — ABNORMAL HIGH (ref 0.5–5.0)
CK, MB: 7.2 ng/mL — ABNORMAL HIGH (ref 0.5–5.0)
Relative Index: 17.3 — ABNORMAL HIGH (ref 0.0–2.5)
Relative Index: 4.6 — ABNORMAL HIGH (ref 0.0–2.5)
Total CK: 155 U/L (ref 38–234)
Total CK: 632 U/L — ABNORMAL HIGH (ref 38–234)

## 2015-05-23 LAB — GLUCOSE, CAPILLARY
GLUCOSE-CAPILLARY: 131 mg/dL — AB (ref 65–99)
GLUCOSE-CAPILLARY: 178 mg/dL — AB (ref 65–99)
GLUCOSE-CAPILLARY: 105 mg/dL — AB (ref 65–99)
Glucose-Capillary: 107 mg/dL — ABNORMAL HIGH (ref 65–99)
Glucose-Capillary: 129 mg/dL — ABNORMAL HIGH (ref 65–99)
Glucose-Capillary: 138 mg/dL — ABNORMAL HIGH (ref 65–99)
Glucose-Capillary: 174 mg/dL — ABNORMAL HIGH (ref 65–99)

## 2015-05-23 LAB — CBC
HCT: 33.2 % — ABNORMAL LOW (ref 36.0–46.0)
HCT: 34 % — ABNORMAL LOW (ref 36.0–46.0)
Hemoglobin: 10.6 g/dL — ABNORMAL LOW (ref 12.0–15.0)
Hemoglobin: 11.1 g/dL — ABNORMAL LOW (ref 12.0–15.0)
MCH: 26.6 pg (ref 26.0–34.0)
MCH: 27 pg (ref 26.0–34.0)
MCHC: 31.9 g/dL (ref 30.0–36.0)
MCHC: 32.6 g/dL (ref 30.0–36.0)
MCV: 83.4 fL (ref 78.0–100.0)
MCV: 82.7 fL (ref 78.0–100.0)
Platelets: 203 10*3/uL (ref 150–400)
Platelets: 213 10*3/uL (ref 150–400)
RBC: 3.98 MIL/uL (ref 3.87–5.11)
RBC: 4.11 MIL/uL (ref 3.87–5.11)
RDW: 12.9 % (ref 11.5–15.5)
RDW: 13.1 % (ref 11.5–15.5)
WBC: 14.1 10*3/uL — AB (ref 4.0–10.5)
WBC: 15.2 10*3/uL — AB (ref 4.0–10.5)

## 2015-05-23 LAB — POCT I-STAT 4, (NA,K, GLUC, HGB,HCT)
GLUCOSE: 145 mg/dL — AB (ref 65–99)
HCT: 33 % — ABNORMAL LOW (ref 36.0–46.0)
Hemoglobin: 11.2 g/dL — ABNORMAL LOW (ref 12.0–15.0)
Potassium: 4.1 mmol/L (ref 3.5–5.1)
Sodium: 143 mmol/L (ref 135–145)

## 2015-05-23 LAB — ABO/RH: ABO/RH(D): A POS

## 2015-05-23 LAB — CREATININE, SERUM
Creatinine, Ser: 0.64 mg/dL (ref 0.44–1.00)
GFR calc non Af Amer: >60 mL/min (ref >60)

## 2015-05-23 LAB — MAGNESIUM
Magnesium: 1.9 mg/dL (ref 1.7–2.4)
Magnesium: 2.8 mg/dL — ABNORMAL HIGH (ref 1.7–2.4)

## 2015-05-23 LAB — APTT
APTT: 70 s — AB (ref 24–37)
aPTT: >200 seconds (ref 24–37)

## 2015-05-23 LAB — PLATELET COUNT: PLATELETS: 168 10*3/uL (ref 150–400)

## 2015-05-23 LAB — POCT ACTIVATED CLOTTING TIME: Activated Clotting Time: 245 seconds

## 2015-05-23 MED ORDER — BISACODYL 5 MG PO TBEC
10.0000 mg | DELAYED_RELEASE_TABLET | Freq: Every day | ORAL | Status: DC
  Administered 2015-05-24 – 2015-05-25 (×2): 10 mg via ORAL
  Filled 2015-05-23 (×2): qty 2

## 2015-05-23 MED ORDER — SODIUM CHLORIDE 0.9 % IJ SOLN
3.0000 mL | Freq: Two times a day (BID) | INTRAMUSCULAR | Status: DC
  Administered 2015-05-24: 3 mL via INTRAVENOUS

## 2015-05-23 MED ORDER — NITROGLYCERIN IN D5W 200-5 MCG/ML-% IV SOLN: 0.0000 ug/min | INTRAVENOUS | Status: DC

## 2015-05-23 MED ORDER — SUCCINYLCHOLINE CHLORIDE 20 MG/ML IJ SOLN
INTRAMUSCULAR | Status: DC | PRN
  Administered 2015-05-23: 60 mg via INTRAVENOUS

## 2015-05-23 MED ORDER — CEFUROXIME SODIUM 1.5 G IJ SOLR
1.5000 g | Freq: Two times a day (BID) | INTRAMUSCULAR | Status: DC
  Administered 2015-05-23 – 2015-05-24 (×3): 1.5 g via INTRAVENOUS
  Filled 2015-05-23 (×4): qty 1.5

## 2015-05-23 MED ORDER — FENTANYL CITRATE (PF) 250 MCG/5ML IJ SOLN
INTRAMUSCULAR | Status: AC
  Filled 2015-05-23: qty 5

## 2015-05-23 MED ORDER — ACETAMINOPHEN 500 MG PO TABS
1000.0000 mg | ORAL_TABLET | Freq: Four times a day (QID) | ORAL | Status: DC
  Administered 2015-05-24 – 2015-05-27 (×12): 1000 mg via ORAL
  Filled 2015-05-23 (×21): qty 2

## 2015-05-23 MED ORDER — METOCLOPRAMIDE HCL 5 MG/ML IJ SOLN
10.0000 mg | Freq: Four times a day (QID) | INTRAMUSCULAR | Status: DC
  Administered 2015-05-23 – 2015-05-24 (×5): 10 mg via INTRAVENOUS
  Filled 2015-05-23 (×8): qty 2

## 2015-05-23 MED ORDER — MIDAZOLAM HCL 2 MG/2ML IJ SOLN
2.0000 mg | INTRAMUSCULAR | Status: DC | PRN
Start: 2015-05-23 — End: 2015-05-23

## 2015-05-23 MED ORDER — ACETAMINOPHEN 160 MG/5ML PO SOLN
1000.0000 mg | Freq: Four times a day (QID) | ORAL | Status: DC
  Filled 2015-05-23: qty 40

## 2015-05-23 MED ORDER — PROTAMINE SULFATE 10 MG/ML IV SOLN
INTRAVENOUS | Status: DC | PRN
  Administered 2015-05-23: 200 mg via INTRAVENOUS
  Administered 2015-05-23: 50 mg via INTRAVENOUS

## 2015-05-23 MED ORDER — PANTOPRAZOLE SODIUM 40 MG PO TBEC
40.0000 mg | DELAYED_RELEASE_TABLET | Freq: Every day | ORAL | Status: DC
  Administered 2015-05-25 – 2015-05-28 (×4): 40 mg via ORAL
  Filled 2015-05-23 (×4): qty 1

## 2015-05-23 MED ORDER — ASPIRIN 81 MG PO CHEW: 324.0000 mg | CHEWABLE_TABLET | Freq: Every day | ORAL | Status: DC

## 2015-05-23 MED ORDER — DEXMEDETOMIDINE HCL IN NACL 400 MCG/100ML IV SOLN
0.1000 ug/kg/h | INTRAVENOUS | Status: DC
  Administered 2015-05-23: .2 ug/kg/h via INTRAVENOUS
  Filled 2015-05-23: qty 100

## 2015-05-23 MED ORDER — SODIUM CHLORIDE 0.9 % IJ SOLN
OROMUCOSAL | Status: DC | PRN
  Administered 2015-05-23: 02:00:00 via TOPICAL

## 2015-05-23 MED ORDER — MORPHINE SULFATE 2 MG/ML IJ SOLN
2.0000 mg | INTRAMUSCULAR | Status: DC | PRN
  Administered 2015-05-23: 2 mg via INTRAVENOUS
  Administered 2015-05-23: 4 mg via INTRAVENOUS
  Administered 2015-05-23 – 2015-05-24 (×2): 2 mg via INTRAVENOUS
  Filled 2015-05-23: qty 2
  Filled 2015-05-23: qty 1
  Filled 2015-05-23: qty 2
  Filled 2015-05-23 (×2): qty 1

## 2015-05-23 MED ORDER — PHENYLEPHRINE HCL 10 MG/ML IJ SOLN
INTRAMUSCULAR | Status: DC | PRN
  Administered 2015-05-23 (×2): 80 ug via INTRAVENOUS

## 2015-05-23 MED ORDER — PHENYLEPHRINE HCL 10 MG/ML IJ SOLN
30.0000 ug/min | INTRAVENOUS | Status: DC
  Filled 2015-05-23: qty 2

## 2015-05-23 MED ORDER — LACTATED RINGERS IV SOLN
INTRAVENOUS | Status: DC | PRN
  Administered 2015-05-23: 01:00:00 via INTRAVENOUS

## 2015-05-23 MED ORDER — PHENYLEPHRINE HCL 10 MG/ML IJ SOLN
0.0000 ug/min | INTRAMUSCULAR | Status: DC
  Filled 2015-05-23: qty 2

## 2015-05-23 MED ORDER — SODIUM CHLORIDE 0.45 % IV SOLN: INTRAVENOUS | Status: DC | PRN

## 2015-05-23 MED ORDER — METOPROLOL TARTRATE 1 MG/ML IV SOLN
2.5000 mg | INTRAVENOUS | Status: DC | PRN
  Administered 2015-05-23: 2.5 mg via INTRAVENOUS
  Filled 2015-05-23: qty 5

## 2015-05-23 MED ORDER — MAGNESIUM SULFATE 50 % IJ SOLN
40.0000 meq | INTRAMUSCULAR | Status: DC
  Filled 2015-05-23: qty 10

## 2015-05-23 MED ORDER — ASPIRIN EC 325 MG PO TBEC
325.0000 mg | DELAYED_RELEASE_TABLET | Freq: Every day | ORAL | Status: DC
  Administered 2015-05-24 – 2015-05-28 (×5): 325 mg via ORAL
  Filled 2015-05-23 (×5): qty 1

## 2015-05-23 MED ORDER — EPINEPHRINE HCL 1 MG/ML IJ SOLN
0.0000 ug/min | INTRAVENOUS | Status: DC
  Filled 2015-05-23: qty 4

## 2015-05-23 MED ORDER — LACTATED RINGERS IV SOLN
INTRAVENOUS | Status: DC | PRN
  Administered 2015-05-23 (×2): via INTRAVENOUS

## 2015-05-23 MED ORDER — DOCUSATE SODIUM 100 MG PO CAPS
200.0000 mg | ORAL_CAPSULE | Freq: Every day | ORAL | Status: DC
  Administered 2015-05-24 – 2015-05-25 (×2): 200 mg via ORAL
  Filled 2015-05-23 (×3): qty 2

## 2015-05-23 MED ORDER — SODIUM CHLORIDE 0.9 % IV SOLN
INTRAVENOUS | Status: DC
Start: 2015-05-23 — End: 2015-05-23

## 2015-05-23 MED ORDER — OXYCODONE HCL 5 MG PO TABS
5.0000 mg | ORAL_TABLET | ORAL | Status: DC | PRN
  Administered 2015-05-23 – 2015-05-24 (×3): 5 mg via ORAL
  Filled 2015-05-23 (×4): qty 1

## 2015-05-23 MED ORDER — SODIUM CHLORIDE 0.9 % IV SOLN
INTRAVENOUS | Status: DC
  Filled 2015-05-23: qty 30

## 2015-05-23 MED ORDER — FENTANYL CITRATE (PF) 250 MCG/5ML IJ SOLN
INTRAMUSCULAR | Status: DC | PRN
  Administered 2015-05-23: 250 ug via INTRAVENOUS
  Administered 2015-05-23: 100 ug via INTRAVENOUS
  Administered 2015-05-23: 50 ug via INTRAVENOUS
  Administered 2015-05-23: 200 ug via INTRAVENOUS
  Administered 2015-05-23 (×4): 100 ug via INTRAVENOUS
  Administered 2015-05-23: 250 ug via INTRAVENOUS

## 2015-05-23 MED ORDER — NITROGLYCERIN IN D5W 200-5 MCG/ML-% IV SOLN
2.0000 ug/min | INTRAVENOUS | Status: DC
  Filled 2015-05-23: qty 250

## 2015-05-23 MED ORDER — ACETAMINOPHEN 160 MG/5ML PO SOLN: 650.0000 mg | Freq: Once | ORAL | Status: AC

## 2015-05-23 MED ORDER — INSULIN REGULAR BOLUS VIA INFUSION
0.0000 [IU] | Freq: Three times a day (TID) | INTRAVENOUS | Status: DC
  Filled 2015-05-23: qty 10

## 2015-05-23 MED ORDER — LACTATED RINGERS IV SOLN
500.0000 mL | Freq: Once | INTRAVENOUS | Status: DC | PRN
Start: 2015-05-23 — End: 2015-05-23

## 2015-05-23 MED ORDER — EPHEDRINE SULFATE 50 MG/ML IJ SOLN
INTRAMUSCULAR | Status: DC | PRN
  Administered 2015-05-23: 5 mg via INTRAVENOUS

## 2015-05-23 MED ORDER — VANCOMYCIN HCL IN DEXTROSE 1-5 GM/200ML-% IV SOLN
1000.0000 mg | Freq: Once | INTRAVENOUS | Status: AC
  Administered 2015-05-23: 1000 mg via INTRAVENOUS
  Filled 2015-05-23: qty 200

## 2015-05-23 MED ORDER — INSULIN DETEMIR 100 UNIT/ML ~~LOC~~ SOLN
5.0000 [IU] | Freq: Every day | SUBCUTANEOUS | Status: DC
  Filled 2015-05-23: qty 0.05

## 2015-05-23 MED ORDER — LACTATED RINGERS IV SOLN: INTRAVENOUS | Status: DC

## 2015-05-23 MED ORDER — DEXMEDETOMIDINE HCL IN NACL 200 MCG/50ML IV SOLN: 0.0000 ug/kg/h | INTRAVENOUS | Status: DC

## 2015-05-23 MED ORDER — MORPHINE SULFATE 2 MG/ML IJ SOLN: 1.0000 mg | INTRAMUSCULAR | Status: AC | PRN

## 2015-05-23 MED ORDER — POTASSIUM CHLORIDE 10 MEQ/50ML IV SOLN
10.0000 meq | Freq: Once | INTRAVENOUS | Status: AC
  Administered 2015-05-23: 10 meq via INTRAVENOUS

## 2015-05-23 MED ORDER — ACETAMINOPHEN 650 MG RE SUPP
650.0000 mg | Freq: Once | RECTAL | Status: AC
  Administered 2015-05-23: 650 mg via RECTAL

## 2015-05-23 MED ORDER — MAGNESIUM SULFATE 4 GM/100ML IV SOLN
4.0000 g | Freq: Once | INTRAVENOUS | Status: AC
  Administered 2015-05-23: 4 g via INTRAVENOUS
  Filled 2015-05-23: qty 100

## 2015-05-23 MED ORDER — INSULIN REGULAR HUMAN 100 UNIT/ML IJ SOLN
INTRAMUSCULAR | Status: DC
  Filled 2015-05-23: qty 2.5

## 2015-05-23 MED ORDER — PAPAVERINE HCL 30 MG/ML IJ SOLN
INTRAMUSCULAR | Status: DC
  Filled 2015-05-23: qty 2.5

## 2015-05-23 MED ORDER — CALCIPOTRIENE 0.005 % EX CREA: 1.0000 "application " | TOPICAL_CREAM | Freq: Two times a day (BID) | CUTANEOUS | Status: DC

## 2015-05-23 MED ORDER — INSULIN DETEMIR 100 UNIT/ML ~~LOC~~ SOLN
20.0000 [IU] | Freq: Every day | SUBCUTANEOUS | Status: DC
  Filled 2015-05-23: qty 0.2

## 2015-05-23 MED ORDER — DEXTROSE 5 % IV SOLN
10.0000 mg | INTRAVENOUS | Status: DC | PRN
  Administered 2015-05-23: 10 ug/min via INTRAVENOUS

## 2015-05-23 MED ORDER — ENOXAPARIN SODIUM 40 MG/0.4ML ~~LOC~~ SOLN
40.0000 mg | Freq: Every day | SUBCUTANEOUS | Status: DC
  Administered 2015-05-23 – 2015-05-27 (×5): 40 mg via SUBCUTANEOUS
  Filled 2015-05-23 (×6): qty 0.4

## 2015-05-23 MED ORDER — POTASSIUM CHLORIDE 10 MEQ/50ML IV SOLN
10.0000 meq | INTRAVENOUS | Status: AC
  Administered 2015-05-23 (×3): 10 meq via INTRAVENOUS

## 2015-05-23 MED ORDER — PERFLUTREN LIPID MICROSPHERE
1.0000 mL | INTRAVENOUS | Status: AC | PRN
  Administered 2015-05-23: 2 mL via INTRAVENOUS
  Filled 2015-05-23: qty 10

## 2015-05-23 MED ORDER — ROCURONIUM BROMIDE 100 MG/10ML IV SOLN
INTRAVENOUS | Status: DC | PRN
  Administered 2015-05-23 (×3): 50 mg via INTRAVENOUS

## 2015-05-23 MED ORDER — PROPOFOL 10 MG/ML IV BOLUS
INTRAVENOUS | Status: AC
  Filled 2015-05-23: qty 20

## 2015-05-23 MED ORDER — POTASSIUM CHLORIDE 2 MEQ/ML IV SOLN
80.0000 meq | INTRAVENOUS | Status: DC
  Filled 2015-05-23: qty 40

## 2015-05-23 MED ORDER — SODIUM CHLORIDE 0.9 % IV SOLN
1250.0000 mg | INTRAVENOUS | Status: DC
  Administered 2015-05-23: 1250 mg via INTRAVENOUS
  Filled 2015-05-23: qty 1250

## 2015-05-23 MED ORDER — SODIUM CHLORIDE 0.9 % IJ SOLN: 3.0000 mL | INTRAMUSCULAR | Status: DC | PRN

## 2015-05-23 MED ORDER — MIDAZOLAM HCL 10 MG/2ML IJ SOLN
INTRAMUSCULAR | Status: AC
  Filled 2015-05-23: qty 2

## 2015-05-23 MED ORDER — SODIUM CHLORIDE 0.9 % IV SOLN
INTRAVENOUS | Status: DC
  Administered 2015-05-23: 69.8 mL/h via INTRAVENOUS
  Filled 2015-05-23: qty 40

## 2015-05-23 MED ORDER — FAMOTIDINE IN NACL 20-0.9 MG/50ML-% IV SOLN
20.0000 mg | Freq: Two times a day (BID) | INTRAVENOUS | Status: AC
  Administered 2015-05-23 (×2): 20 mg via INTRAVENOUS
  Filled 2015-05-23: qty 50

## 2015-05-23 MED ORDER — HEPARIN SODIUM (PORCINE) 1000 UNIT/ML IJ SOLN
INTRAMUSCULAR | Status: DC | PRN
  Administered 2015-05-23: 2000 [IU] via INTRAVENOUS
  Administered 2015-05-23: 16000 [IU] via INTRAVENOUS

## 2015-05-23 MED ORDER — INSULIN ASPART 100 UNIT/ML ~~LOC~~ SOLN
0.0000 [IU] | SUBCUTANEOUS | Status: DC
  Administered 2015-05-23 (×2): 2 [IU] via SUBCUTANEOUS
  Administered 2015-05-23: 4 [IU] via SUBCUTANEOUS

## 2015-05-23 MED ORDER — ONDANSETRON HCL 4 MG/2ML IJ SOLN
4.0000 mg | Freq: Four times a day (QID) | INTRAMUSCULAR | Status: DC | PRN
  Administered 2015-05-23 – 2015-05-24 (×2): 4 mg via INTRAVENOUS
  Filled 2015-05-23 (×2): qty 2

## 2015-05-23 MED ORDER — ALBUMIN HUMAN 5 % IV SOLN
12.5000 g | Freq: Once | INTRAVENOUS | Status: AC
  Administered 2015-05-23: 12.5 g via INTRAVENOUS

## 2015-05-23 MED ORDER — METOPROLOL TARTRATE 25 MG/10 ML ORAL SUSPENSION
12.5000 mg | Freq: Two times a day (BID) | ORAL | Status: DC
  Filled 2015-05-23 (×4): qty 5

## 2015-05-23 MED ORDER — SODIUM CHLORIDE 0.9 % IV SOLN
INTRAVENOUS | Status: DC
  Administered 2015-05-23: 2.4 [IU]/h via INTRAVENOUS
  Filled 2015-05-23: qty 2.5

## 2015-05-23 MED ORDER — ALBUMIN HUMAN 5 % IV SOLN
250.0000 mL | INTRAVENOUS | Status: DC | PRN
  Filled 2015-05-23: qty 250

## 2015-05-23 MED ORDER — LIDOCAINE HCL (CARDIAC) 20 MG/ML IV SOLN
INTRAVENOUS | Status: DC | PRN
  Administered 2015-05-23: 60 mg via INTRAVENOUS

## 2015-05-23 MED ORDER — DEXTROSE 5 % IV SOLN
1.5000 g | INTRAVENOUS | Status: DC
  Administered 2015-05-23: .75 g via INTRAVENOUS
  Administered 2015-05-23: 1.5 g via INTRAVENOUS
  Filled 2015-05-23: qty 1.5

## 2015-05-23 MED ORDER — TRAMADOL HCL 50 MG PO TABS: 50.0000 mg | ORAL_TABLET | ORAL | Status: DC | PRN

## 2015-05-23 MED ORDER — BISACODYL 10 MG RE SUPP: 10.0000 mg | Freq: Every day | RECTAL | Status: DC

## 2015-05-23 MED ORDER — POTASSIUM CHLORIDE 10 MEQ/50ML IV SOLN
10.0000 meq | INTRAVENOUS | Status: AC
  Administered 2015-05-23 (×4): 10 meq via INTRAVENOUS
  Filled 2015-05-23 (×2): qty 50

## 2015-05-23 MED ORDER — 0.9 % SODIUM CHLORIDE (POUR BTL) OPTIME
TOPICAL | Status: DC | PRN
  Administered 2015-05-23: 1000 mL

## 2015-05-23 MED ORDER — DOPAMINE-DEXTROSE 3.2-5 MG/ML-% IV SOLN
0.0000 ug/kg/min | INTRAVENOUS | Status: DC
  Administered 2015-05-23: 3 ug/kg/min via INTRAVENOUS
  Filled 2015-05-23: qty 250

## 2015-05-23 MED ORDER — MIDAZOLAM HCL 5 MG/5ML IJ SOLN
INTRAMUSCULAR | Status: DC | PRN
  Administered 2015-05-23: 4 mg via INTRAVENOUS
  Administered 2015-05-23 (×3): 2 mg via INTRAVENOUS

## 2015-05-23 MED ORDER — SODIUM CHLORIDE 0.9 % IV SOLN: 250.0000 mL | INTRAVENOUS | Status: DC

## 2015-05-23 MED ORDER — PROPOFOL 10 MG/ML IV BOLUS
INTRAVENOUS | Status: DC | PRN
  Administered 2015-05-23: 20 mg via INTRAVENOUS
  Administered 2015-05-23: 60 mg via INTRAVENOUS

## 2015-05-23 MED ORDER — METOPROLOL TARTRATE 12.5 MG HALF TABLET
12.5000 mg | ORAL_TABLET | Freq: Two times a day (BID) | ORAL | Status: DC
  Administered 2015-05-23 (×2): 12.5 mg via ORAL
  Filled 2015-05-23 (×4): qty 1

## 2015-05-23 MED ORDER — DEXTROSE 5 % IV SOLN
750.0000 mg | INTRAVENOUS | Status: DC
  Filled 2015-05-23: qty 750

## 2015-05-23 MED ORDER — CLOBETASOL PROPIONATE 0.05 % EX CREA
1.0000 "application " | TOPICAL_CREAM | Freq: Two times a day (BID) | CUTANEOUS | Status: DC
  Administered 2015-05-23 – 2015-05-27 (×8): 1 via TOPICAL
  Filled 2015-05-23: qty 15

## 2015-05-23 NOTE — Transfer of Care (Signed)
Immediate Anesthesia Transfer of Care Note  Patient: Dana Palmer  Procedure(s) Performed: Procedure(s): CORONARY ARTERY BYPASS GRAFTING (CABG) (N/A) TRANSESOPHAGEAL ECHOCARDIOGRAM (TEE)  Patient Location: SICU  Anesthesia Type:General  Level of Consciousness: sedated  Airway & Oxygen Therapy: Patient remains intubated per anesthesia plan and Patient placed on Ventilator (see vital sign flow sheet for setting)  Post-op Assessment: Report given to RN and Post -op Vital signs reviewed and stable  Post vital signs: Reviewed and stable  Last Vitals:  Filed Vitals:   05/23/15 0450  BP:   Pulse: 97  Temp: 35.9 C  Resp: 12    Complications: No apparent anesthesia complications

## 2015-05-23 NOTE — Progress Notes (Signed)
CRITICAL VALUE ALERT  Critical value received:  Troponin 6.2  Date of notification:  05/23/2015  Time of notification:  1920  Critical value read back:Yes.    Nurse who received alert:  Tammy Sours RN  MD notified (1st page):  Dr Dorris Fetch  Time of first page:  1920  MD notified (2nd page):  Time of second page:  Responding MD:  Dr Dorris Fetch  Time MD responded:  [phone]

## 2015-05-23 NOTE — Progress Notes (Signed)
      [address]       [place] [id number]             [phone]      Sleeping currently  BP 131/79 mmHg  Pulse 90  Temp(Src) 97.3 F (36.3 C) (Core (Comment))  Resp 16  Ht 5\' 2"  (1.575 m)  Wt 133 lb (60.328 kg)  BMI 24.32 kg/m2  SpO2 96%   Intake/Output Summary (Last 24 hours) at 05/23/15 1828 Last data filed at 05/23/15 1800  Gross per 24 hour  Intake 4452.67 ml  Output   4765 ml  Net -312.33 ml   K= 3.3- given 4 runs, will recheck Hct 32  Troponin elevated as expected at 5.18  CBG up , back on insulin drip  Viviann Spare C. Dorris Fetch, MD [facility] [phone]

## 2015-05-23 NOTE — Procedures (Signed)
Extubation Procedure Note  Patient Details:   Name: Alleta Barilla DOB: 1941/01/17 MRN: [id number]   Airway Documentation:     Evaluation  O2 sats: stable throughout Complications: No apparent complications Patient did tolerate procedure well. Bilateral Breath Sounds: Clear, Diminished   Yes   Pt extubated per SICU wean protocol. Pts NIF was -25, VC 1.05L with good pt effort.   Sats 98% on 4L [place].  RT will continue to monitor.  Ronny Flurry 05/23/2015, 9:08 AM

## 2015-05-23 NOTE — Anesthesia Procedure Notes (Addendum)
Procedure Name: Intubation Date/Time: 05/23/2015 2:02 AM Performed by: Brien Mates D Pre-anesthesia Checklist: Patient identified, Emergency Drugs available, Suction available, Patient being monitored and Timeout performed Patient Re-evaluated:Patient Re-evaluated prior to inductionOxygen Delivery Method: Circle system utilized Preoxygenation: Pre-oxygenation with 100% oxygen Intubation Type: IV induction, Rapid sequence and Cricoid Pressure applied Laryngoscope Size: Miller and 2 Grade View: Grade I Tube type: Subglottic suction tube Tube size: 7.5 mm Number of attempts: 1 Airway Equipment and Method: Stylet Placement Confirmation: ETT inserted through vocal cords under direct vision,  positive ETCO2 and breath sounds checked- equal and bilateral Secured at: 20 cm Tube secured with: Tape Dental Injury: Teeth and Oropharynx as per pre-operative assessment    Performed by: Brien Mates D

## 2015-05-23 NOTE — Progress Notes (Signed)
1 Day Post-Op Procedure(s) (LRB): CORONARY ARTERY BYPASS GRAFTING (CABG) (N/A) TRANSESOPHAGEAL ECHOCARDIOGRAM (TEE) Subjective: Intubated, starting to wake up  Objective: Vital signs in last 24 hours: Temp:  [96.1 F (35.6 C)-98.4 F (36.9 C)] 98.4 F (36.9 C) (06/26 0800) Pulse Rate:  [0-106] 94 (06/26 0815) Cardiac Rhythm:  [-] Normal sinus rhythm (06/26 0800) Resp:  [0-32] 20 (06/26 0815) BP: (82-201)/(56-131) 122/79 mmHg (06/26 0815) SpO2:  [0 %-100 %] 98 % (06/26 0815) Arterial Line BP: (91-150)/(58-78) 124/69 mmHg (06/26 0800) FiO2 (%):  [40 %-50 %] 40 % (06/26 0815) Weight:  [133 lb (60.328 kg)] 133 lb (60.328 kg) (06/25 2216)  Hemodynamic parameters for last 24 hours: PAP: (22-28)/(17-20) 28/20 mmHg CO:  [2.6 L/min-3.4 L/min] 3 L/min CI:  [1.6 L/min/m2-2.1 L/min/m2] 1.9 L/min/m2  Intake/Output from previous day: 06/25 0701 - 06/26 0700 In: 3486.9 [I.V.:3076.9; Blood:410] Out: 2830 [Urine:1450; Blood:1350; Chest Tube:30] Intake/Output this shift: Total I/O In: 119.1 [I.V.:39.1; NG/GT:30; IV Piggyback:50] Out: 235 [Urine:175; Emesis/NG output:50; Chest Tube:10]  General appearance: sedated Neurologic: has moved all 4 ext Heart: regular rate and rhythm Lungs: clear to auscultation bilaterally  Lab Results:  Recent Labs  05/22/15 2340  05/23/15 0315  05/23/15 0459 05/23/15 0503  WBC 8.3  --   --   --   --  14.1*  HGB 12.4  < > 6.8*  < > 10.2* 10.6*  HCT 37.7  < > 21.0*  < > 30.0* 33.2*  PLT 260  --  168  --   --  203  < > = values in this interval not displayed. BMET:  Recent Labs  05/22/15 2219  05/23/15 0459 05/23/15 0503  NA 140  < > 142 141  K 3.2*  < > 3.4* 3.5  CL 102  < > 106 111  CO2 26  --   --  24  GLUCOSE 105*  < > 136* 138*  BUN 15  < > 11 10  CREATININE 0.95  < > 0.60 0.74  CALCIUM 9.2  --   --  6.5*  < > = values in this interval not displayed.  PT/INR:  Recent Labs  05/23/15 0503  LABPROT 25.1*  INR 2.30*   ABG     Component Value Date/Time   PHART 7.262* 05/23/2015 0500   HCO3 21.9 05/23/2015 0500   TCO2 23 05/23/2015 0500   ACIDBASEDEF 5.0* 05/23/2015 0500   O2SAT 93.0 05/23/2015 0500   CBG (last 3)  No results for input(s): GLUCAP in the last 72 hours.  Assessment/Plan: S/P Procedure(s) (LRB): CORONARY ARTERY BYPASS GRAFTING (CABG) (N/A) TRANSESOPHAGEAL ECHOCARDIOGRAM (TEE) -  Doing well early postop after emergency CABG  CV- index down slightly with PAD of 17- albumin  Wean dopamine  RESP- wean vent as tolerated  IS post extubation  RENAL_ creatinine OK, hypokalemia- supplement  ENDO- insulin drip, transition off later this evening  Minimal drainage from CT despite INR being elevated- dc CT after extubation  Anemia secondary to ABL- mild follow   LOS: 0 days    Dana Palmer 05/23/2015

## 2015-05-23 NOTE — Op Note (Signed)
NAMESHAYNE, Palmer NO.:  [id number]  MEDICAL RECORD NO.:  [id number]  LOCATION:  2S12C                        FACILITY:  [facility]  PHYSICIAN:  Salvatore Decent. Dorris Fetch, M.D.DATE OF BIRTH:  Jan 03, 1941  DATE OF PROCEDURE:  05/23/2015 DATE OF DISCHARGE:                              OPERATIVE REPORT   PREOPERATIVE DIAGNOSIS:  Severe single-vessel coronary disease status post ST-elevation myocardial infarction.  POSTOPERATIVE DIAGNOSIS:  Severe single-vessel coronary disease status post ST-elevation myocardial infarction.  PROCEDURE:   Median sternotomy Emergency coronary artery bypass grafting x 2  Left internal mammary artery to left anterior descending  Saphenous vein graft to diagonal Endoscopic vein harvest left thigh.  SURGEON:  Salvatore Decent. Dorris Fetch, M.D.  ASSISTANT:  Lowella Dandy, PA.  ANESTHESIA:  General.  FINDINGS:  Good quality targets, good quality conduits.  Transesophageal echocardiography revealed mid anterior and apical akinesis, no significant valvular pathology.  Right leg greater saphenous vein felt to be too small to harvest.  CLINICAL NOTE:  Dana Palmer is a 74 year old woman who presented with chest pain.  In the emergency department, she was found to have ST- elevation.  She was taken emergently to the catheterization laboratory. On cardiac catheterization, she had single-vessel coronary disease involving the LAD.  There was a complex lesion with thrombus at the ostium as well as another tight lesion in the mid vessel.  Dr. Katrinka Blazing did not feel this was safe for percutaneous intervention.  She was advised to undergo emergency coronary bypass grafting for myocardial preservation, relief of symptoms, and survival benefit.  The indications, risks, benefits, and alternatives were discussed in detail with the patient.  She understood and accepted the risks and agreed to proceed.  OPERATIVE NOTE:  Dana Palmer was brought emergently to the  operating room on May 23, 2015.  Anesthesia placed a Swan-Ganz catheter and arterial blood pressure monitoring line.  She was anesthetized and intubated.  A Foley catheter was placed.  Intravenous antibiotics were administered. Transesophageal echocardiography was performed.  Findings were as noted above.  The chest, abdomen, and legs were prepped and draped in usual sterile fashion.  A median sternotomy was performed, and the left internal mammary artery was harvested using standard technique. Simultaneously, an incision was made in the medial aspect of the right leg at the level of the knee.  The greater saphenous vein was identified but appeared small and the decision was made not to harvest that vein.  An incision was made in the medial aspect of the left leg, and the saphenous vein on that side was a good quality vessel.  It was harvested from the thigh.  2000 units of heparin was administered during the vessel harvest.  The remainder of the full heparin dose given prior to opening the pericardium.  The pericardium was opened.  The ascending aorta was inspected.  There was no evidence of atherosclerotic disease.  The aorta was cannulated via concentric 2-0 Ethibond pledgeted pursestring sutures.  A dual-stage venous cannula was placed via a pursestring suture in the right atrial appendage.  After confirming adequate anticoagulation with ACT measurement, cardiopulmonary bypass was initiated.  Flows were maintained per protocol.  She was cooled to  34 degrees Celsius.  The coronary arteries were inspected and anastomotic sites were chosen.  The conduits were inspected and cut to length.  A foam pad was placed in the pericardium to insulate the heart and protect the left phrenic nerve, and a temperature probe was placed in the myocardial septum.  The aorta was crossclamped.  The left ventricle was emptied via the root vent.  Cardiac arrest then was achieved with a combination of  cold antegrade blood cardioplegia and topical iced saline.  1.5 L of cardioplegia was administered.  There was a rapid diastolic arrest. Septal cooling was sluggish but eventually cooled to 11 degrees Celsius.  A reversed saphenous vein graft was placed end-to-side to the first diagonal branch to the LAD.  These were both good quality vessels.  The diagonal accepted a 1.5 mm probe.  The end-to-side anastomosis was performed with a running 7-0 Prolene suture.  The anastomosis was probed proximally and distally, and the probe passed easily.  Cardioplegia was administered.  There was good flow and good hemostasis.  The left internal mammary artery was brought through a window in the pericardium.  The distal end was beveled.  It was then anastomosed end- to-side to the distal LAD.  These were both 1.5 mm good quality vessels. The end-to-side anastomosis was performed with a running 8-0 Prolene suture.  A probe passed easily proximally and distally.  The bulldog clamp was briefly removed to inspect for hemostasis.  Rapid septal rewarming was noted.  The bulldog clamp was replaced.  The mammary pedicle was tacked to the epicardial surface of the heart with 6-0 Prolene sutures.  The cardioplegia cannula was removed from the ascending aorta.  The proximal vein graft anastomosis was performed to a 4.5 mm punch aortotomy with running a 6-0 Prolene suture.  At the completion of the proximal anastomosis, the patient was placed in Trendelenburg position. Lidocaine was administered.  The aortic root was de-aired, and the aortic crossclamp was removed.  The total crossclamp time was 32 minutes.  The patient required a single defibrillation with 10 joules and then was in sinus rhythm thereafter.  While rewarming was completed, all proximal and distal anastomoses were inspected for hemostasis.  Epicardial pacing wires were placed on the right ventricle and right atrium.  A dopamine infusion was  initiated at 3 mcg/kg per minute.  When the patient had rewarmed to a core temperature of 37 degrees Celsius, she was weaned from cardiopulmonary bypass on the first attempt.  The total bypass time was 53 minutes.  The initial cardiac index was greater than 2 L/minute/m2, and the patient remained hemodynamically stable throughout the post bypass period.  A test dose of protamine was administered and was well tolerated. The remainder of the protamine was administered without incident.  Chest was irrigated with warm saline.  Hemostasis was achieved.  The pericardium was reapproximated with interrupted 3-0 silk sutures.  Left pleural and mediastinal chest tubes were placed through separate subcostal incisions and secured with #1 silk sutures.  The sternum was closed with a combination of single and double heavy gauge stainless steel wires. The pectoralis fascia, subcutaneous tissue, and skin were closed in standard fashion.  All sponge, needle, and instrument counts were correct at the end of the procedure.  The patient was taken from the operating room to the surgical intensive care unit in good condition.  Postbypass transesophageal echocardiography was unchanged from the prebypass study.  All sponge, needle, and instrument counts were correct at the  end of the procedure.     Salvatore Decent Dorris Fetch, M.D.     SCH/MEDQ  D:  05/23/2015  T:  05/23/2015  Job:  [id number]

## 2015-05-23 NOTE — Brief Op Note (Addendum)
05/22/2015 - 05/23/2015  3:32 AM  PATIENT:  Dana Palmer  74 y.o. female  PRE-OPERATIVE DIAGNOSIS:  Single vessel CAD/ STEMI  POST-OPERATIVE DIAGNOSIS:  Single vessel CAD/ STEMI  PROCEDURE:  Procedure(s):  EMERGENCY CORONARY ARTERY BYPASS GRAFTING x 2 -LIMA to LAD -SVG to DIAGONAL  ENDOSCOPIC HARVEST GREATER SAPHENOUS VEIN - Left Thigh  SURGEON:  Surgeon(s) and Role:    * Dana Slot, MD - Primary  PHYSICIAN ASSISTANT: Dana Dandy PA-C  ANESTHESIA:   general  EBL:  Total I/O In: 1000 [I.V.:1000] Out: 600 [Urine:600]  BLOOD ADMINISTERED: CELLSAVER  DRAINS: Left Pleural Chest Tube, Mediastinal Chest drains   LOCAL MEDICATIONS USED:  NONE  SPECIMEN:  No Specimen  DISPOSITION OF SPECIMEN:  N/A  COUNTS:  YES  PLAN OF CARE: Admit to inpatient   PATIENT DISPOSITION:  ICU - intubated and hemodynamically stable.   Delay start of Pharmacological VTE agent (>24hrs) due to surgical blood loss or risk of bleeding: yes  XC= 32 min CPB= 53 min  Off CPB with dopamine at 3 mcg/kg/min TEE- mid anterior and apical akinesis Good targets Good conduits Right Leg explored, vein too small for harvest  Dana Palmer C. Dorris Fetch, MD [facility] [phone]

## 2015-05-23 NOTE — Anesthesia Preprocedure Evaluation (Signed)
Anesthesia Evaluation  Patient identified by MRN, date of birth, ID band Patient awake  Preop documentation limited or incomplete due to emergent nature of procedure.  Airway Mallampati: II  TM Distance: >3 FB Neck ROM: Full    Dental  (+) Edentulous Upper   Pulmonary former smoker,  breath sounds clear to auscultation        Cardiovascular hypertension, Pt. on medications + angina at rest + CAD and + Past MI Rhythm:Regular     Neuro/Psych negative neurological ROS  negative psych ROS   GI/Hepatic negative GI ROS, Neg liver ROS,   Endo/Other  negative endocrine ROS  Renal/GU negative Renal ROS     Musculoskeletal   Abdominal   Peds  Hematology   Anesthesia Other Findings   Reproductive/Obstetrics                             Anesthesia Physical Anesthesia Plan  ASA: IV and emergent  Anesthesia Plan: General   Post-op Pain Management:    Induction: Intravenous, Rapid sequence and Cricoid pressure planned  Airway Management Planned: Oral ETT  Additional Equipment: Arterial line, TEE, CVP, PA Cath and Ultrasound Guidance Line Placement  Intra-op Plan:   Post-operative Plan: Post-operative intubation/ventilation  Informed Consent:   Only emergency history available  Plan Discussed with: CRNA and Surgeon  Anesthesia Plan Comments:         Anesthesia Quick Evaluation

## 2015-05-23 NOTE — Consult Note (Signed)
Reason for Consult:LAD disease STEMI Referring Physician: Dr. [name] is an 74 y.o. female.  HPI: 74 yo woman with a history of hypertension, remote tobacco abuse and psoriasis presents with a cc/o chest pain.  She had CP earlier in the day and a feeling of extreme fatigue. Tonight she had sudden onset of a severe dull substernal pain with radiation to the left arm. She was diaphoretic and short of breath. She was brought to ED. On arrival she was hypertensive. ECG showed ST elevation in I and AVL./  She was taken to the cath lab where she was found to have severe ostial and mid LAD disease. There was a complex thrombus at the ostium. Dr. Tamala Palmer does not feel this can be stented safely. Her pain has eased off but has not totally resolved.   Past Medical History  Diagnosis Date  . Psoriasis   . Essential hypertension     Past Surgical History  Procedure Laterality Date  . Tubal ligation    . Carpal tunnel release    . Tonsillectomy      Family History  Problem Relation Age of Onset  . Heart attack      Grandfather  . Heart disease Father     Social History:  reports that she quit smoking about 14 years ago. She has never used smokeless tobacco. She reports that she does not drink alcohol or use illicit drugs.  Allergies:  Allergies  Allergen Reactions  . Avelox [Moxifloxacin]   . Codeine   . Sulfa Antibiotics     Medications:  Prior to Admission:  Prescriptions prior to admission  Medication Sig Dispense Refill Last Dose  . Adalimumab (HUMIRA) 40 MG/0.8ML PSKT Inject 40 mg into the skin every 14 (fourteen) days.     . calcipotriene (DOVONOX) 0.005 % cream Apply 1 application topically 2 (two) times daily.     . clobetasol cream (TEMOVATE) 3.01 % Apply 1 application topically 2 (two) times daily.     Marland Kitchen doxycycline (VIBRAMYCIN) 100 MG capsule Take 1 capsule (100 mg total) by mouth 2 (two) times daily. 20 capsule 0   . methotrexate (RHEUMATREX) 10 MG tablet  Take 10 mg by mouth. Caution: Chemotherapy. Protect from light.     . methotrexate (RHEUMATREX) 2.5 MG tablet Take 2.5 mg by mouth once a week. Caution:Chemotherapy. Protect from light.       Results for orders placed or performed during the hospital encounter of 05/22/15 (from the past 48 hour(s))  CBC     Status: None   Collection Time: 05/22/15 10:19 PM  Result Value Ref Range   WBC 7.0 4.0 - 10.5 K/uL   RBC 4.87 3.87 - 5.11 MIL/uL   Hemoglobin 13.1 12.0 - 15.0 g/dL   HCT 40.4 36.0 - 46.0 %   MCV 83.0 78.0 - 100.0 fL   MCH 26.9 26.0 - 34.0 pg   MCHC 32.4 30.0 - 36.0 g/dL   RDW 12.9 11.5 - 15.5 %   Platelets 281 150 - 400 K/uL  Basic metabolic panel     Status: Abnormal   Collection Time: 05/22/15 10:19 PM  Result Value Ref Range   Sodium 140 135 - 145 mmol/L   Potassium 3.2 (L) 3.5 - 5.1 mmol/L   Chloride 102 101 - 111 mmol/L   CO2 26 22 - 32 mmol/L   Glucose, Bld 105 (H) 65 - 99 mg/dL   BUN 15 6 - 20 mg/dL   Creatinine, Ser 0.95  0.44 - 1.00 mg/dL   Calcium 9.2 8.9 - 10.3 mg/dL   GFR calc non Af Amer 58 (L) >60 mL/min   GFR calc Af Amer >60 >60 mL/min    Comment: (NOTE) The eGFR has been calculated using the CKD EPI equation. This calculation has not been validated in all clinical situations. eGFR's persistently <60 mL/min signify possible Chronic Kidney Disease.    Anion gap 12 5 - 15  Troponin I     Status: None   Collection Time: 05/22/15 10:19 PM  Result Value Ref Range   Troponin I <0.03 <0.031 ng/mL    Comment:        NO INDICATION OF MYOCARDIAL INJURY.   I-stat troponin, ED  (not at [facility])     Status: None   Collection Time: 05/22/15 10:24 PM  Result Value Ref Range   Troponin i, poc 0.01 0.00 - 0.08 ng/mL   Comment 3            Comment: Due to the release kinetics of cTnI, a negative result within the first hours of the onset of symptoms does not rule out myocardial infarction with certainty. If myocardial infarction is still suspected, repeat the  test at appropriate intervals.   CBC     Status: None   Collection Time: 05/22/15 11:40 PM  Result Value Ref Range   WBC 8.3 4.0 - 10.5 K/uL   RBC 4.58 3.87 - 5.11 MIL/uL   Hemoglobin 12.4 12.0 - 15.0 g/dL   HCT 37.7 36.0 - 46.0 %   MCV 82.3 78.0 - 100.0 fL   MCH 27.1 26.0 - 34.0 pg   MCHC 32.9 30.0 - 36.0 g/dL   RDW 12.9 11.5 - 15.5 %   Platelets 260 150 - 400 K/uL    No results found.  Review of Systems  Constitutional: Positive for malaise/fatigue and diaphoresis.  Respiratory: Positive for shortness of breath.   Cardiovascular: Positive for chest pain.  Skin: Positive for itching and rash.  All other systems reviewed and are negative.  Blood pressure 183/113, pulse 96, resp. rate 23, height _0  (1.575 m), weight 133 lb (60.328 kg), SpO2 97 %. Physical Exam  Vitals reviewed. Constitutional: She is oriented to person, place, and time. She appears well-developed and well-nourished. No distress.  HENT:  Head: Normocephalic and atraumatic.  Eyes: EOM are normal. Pupils are equal, round, and reactive to light.  Neck: Neck supple.  No carotid bruit  Cardiovascular: Normal rate, regular rhythm, normal heart sounds and intact distal pulses.   No murmur heard. Respiratory: Effort normal and breath sounds normal.  GI: Soft. There is no tenderness.  Musculoskeletal: She exhibits no edema.  Neurological: She is alert and oriented to person, place, and time. No cranial nerve deficit.  Exam limited- on cath table  Skin: Skin is warm and dry.    Assessment/Plan: 74 yo woman with an STEMI secondary to LAD disease. She has a thrombus in the proximal LAD that could potentially compromise the left main and/ or circumflex if ballooned. CABNG is indicated for survival benefit adn relief of symptoms.  I have discussed the general nature of the procedure, the need for general anesthesia, and the incisions to be used with Dana Palmer. I also discussed these issues with her daughter and  son(by phone). I reviewed the indications, risks, benefits and alternatives. They understand the risks include, but are not limited to death, stroke, MI, DVT/PE, bleeding, possible need for transfusion, infections, cardiac arrhythmias,  and other organ system dysfunction including respiratory, renal, or GI complications.   She accepts the risks and agree to proceed.  The OR has been notified  Melrose Nakayama 05/23/2015, 12:05 AM

## 2015-05-23 NOTE — Progress Notes (Signed)
CRITICAL VALUE ALERT  Critical value received:  Troponin-4.32  Date of notification:  05/23/2015  Time of notification:  1145  Critical value read back:Yes.    Nurse who received alert:  Carlos American  MD notified (1st page):  Dorris Fetch  Time of first page:  1145  MD notified (2nd page):  Time of second page:  Responding MD:  Dorris Fetch  Time MD responded:  1146

## 2015-05-23 NOTE — Interval H&P Note (Signed)
History and Physical Interval Note:  05/23/2015 12:15 AM  Dana Palmer  has presented today for surgery, with the diagnosis of CAD  The various methods of treatment have been discussed with the patient and family. After consideration of risks, benefits and other options for treatment, the patient has consented to  Procedure(s): CORONARY ARTERY BYPASS GRAFTING (CABG) (N/A) as a surgical intervention .  The patient's history has been reviewed, patient examined, no change in status, stable for surgery.  I have reviewed the patient's chart and labs.  Questions were answered to the patient's satisfaction.     [name]

## 2015-05-23 NOTE — Progress Notes (Addendum)
  Echocardiogram 2D Echocardiogram with Definity has been performed.  Dana Palmer 05/23/2015, 10:06 AM

## 2015-05-23 NOTE — Progress Notes (Signed)
Called in to remove Right radial artery sheath post surgery.  Right radial sheath removed and TR band placed with 12cc of air.  No bleeding or hematoma.  Site looks good.  RN will continue to monitor and removed band when appropriate.

## 2015-05-23 NOTE — H&P (View-Only) (Signed)
Reason for Consult:LAD disease STEMI Referring Physician: Dr. Viona Gilmore is an 74 y.o. female.  HPI: 74 yo woman with a history of hypertension, remote tobacco abuse and psoriasis presents with a cc/o chest pain.  She had CP earlier in the day and a feeling of extreme fatigue. Tonight she had sudden onset of a severe dull substernal pain with radiation to the left arm. She was diaphoretic and short of breath. She was brought to ED. On arrival she was hypertensive. ECG showed ST elevation in I and AVL./  She was taken to the cath lab where she was found to have severe ostial and mid LAD disease. There was a complex thrombus at the ostium. Dr. Tamala Julian does not feel this can be stented safely. Her pain has eased off but has not totally resolved.   Past Medical History  Diagnosis Date  . Psoriasis   . Essential hypertension     Past Surgical History  Procedure Laterality Date  . Tubal ligation    . Carpal tunnel release    . Tonsillectomy      Family History  Problem Relation Age of Onset  . Heart attack      Grandfather  . Heart disease Father     Social History:  reports that she quit smoking about 14 years ago. She has never used smokeless tobacco. She reports that she does not drink alcohol or use illicit drugs.  Allergies:  Allergies  Allergen Reactions  . Avelox [Moxifloxacin]   . Codeine   . Sulfa Antibiotics     Medications:  Prior to Admission:  Prescriptions prior to admission  Medication Sig Dispense Refill Last Dose  . Adalimumab (HUMIRA) 40 MG/0.8ML PSKT Inject 40 mg into the skin every 14 (fourteen) days.     . calcipotriene (DOVONOX) 0.005 % cream Apply 1 application topically 2 (two) times daily.     . clobetasol cream (TEMOVATE) 3.01 % Apply 1 application topically 2 (two) times daily.     Marland Kitchen doxycycline (VIBRAMYCIN) 100 MG capsule Take 1 capsule (100 mg total) by mouth 2 (two) times daily. 20 capsule 0   . methotrexate (RHEUMATREX) 10 MG tablet  Take 10 mg by mouth. Caution: Chemotherapy. Protect from light.     . methotrexate (RHEUMATREX) 2.5 MG tablet Take 2.5 mg by mouth once a week. Caution:Chemotherapy. Protect from light.       Results for orders placed or performed during the hospital encounter of 05/22/15 (from the past 48 hour(s))  CBC     Status: None   Collection Time: 05/22/15 10:19 PM  Result Value Ref Range   WBC 7.0 4.0 - 10.5 K/uL   RBC 4.87 3.87 - 5.11 MIL/uL   Hemoglobin 13.1 12.0 - 15.0 g/dL   HCT 40.4 36.0 - 46.0 %   MCV 83.0 78.0 - 100.0 fL   MCH 26.9 26.0 - 34.0 pg   MCHC 32.4 30.0 - 36.0 g/dL   RDW 12.9 11.5 - 15.5 %   Platelets 281 150 - 400 K/uL  Basic metabolic panel     Status: Abnormal   Collection Time: 05/22/15 10:19 PM  Result Value Ref Range   Sodium 140 135 - 145 mmol/L   Potassium 3.2 (L) 3.5 - 5.1 mmol/L   Chloride 102 101 - 111 mmol/L   CO2 26 22 - 32 mmol/L   Glucose, Bld 105 (H) 65 - 99 mg/dL   BUN 15 6 - 20 mg/dL   Creatinine, Ser 0.95  0.44 - 1.00 mg/dL   Calcium 9.2 8.9 - 10.3 mg/dL   GFR calc non Af Amer 58 (L) >60 mL/min   GFR calc Af Amer >60 >60 mL/min    Comment: (NOTE) The eGFR has been calculated using the CKD EPI equation. This calculation has not been validated in all clinical situations. eGFR's persistently <60 mL/min signify possible Chronic Kidney Disease.    Anion gap 12 5 - 15  Troponin I     Status: None   Collection Time: 05/22/15 10:19 PM  Result Value Ref Range   Troponin I <0.03 <0.031 ng/mL    Comment:        NO INDICATION OF MYOCARDIAL INJURY.   I-stat troponin, ED  (not at [facility])     Status: None   Collection Time: 05/22/15 10:24 PM  Result Value Ref Range   Troponin i, poc 0.01 0.00 - 0.08 ng/mL   Comment 3            Comment: Due to the release kinetics of cTnI, a negative result within the first hours of the onset of symptoms does not rule out myocardial infarction with certainty. If myocardial infarction is still suspected, repeat the  test at appropriate intervals.   CBC     Status: None   Collection Time: 05/22/15 11:40 PM  Result Value Ref Range   WBC 8.3 4.0 - 10.5 K/uL   RBC 4.58 3.87 - 5.11 MIL/uL   Hemoglobin 12.4 12.0 - 15.0 g/dL   HCT 37.7 36.0 - 46.0 %   MCV 82.3 78.0 - 100.0 fL   MCH 27.1 26.0 - 34.0 pg   MCHC 32.9 30.0 - 36.0 g/dL   RDW 12.9 11.5 - 15.5 %   Platelets 260 150 - 400 K/uL    No results found.  Review of Systems  Constitutional: Positive for malaise/fatigue and diaphoresis.  Respiratory: Positive for shortness of breath.   Cardiovascular: Positive for chest pain.  Skin: Positive for itching and rash.  All other systems reviewed and are negative.  Blood pressure 183/113, pulse 96, resp. rate 23, height _0  (1.575 m), weight 133 lb (60.328 kg), SpO2 97 %. Physical Exam  Vitals reviewed. Constitutional: She is oriented to person, place, and time. She appears well-developed and well-nourished. No distress.  HENT:  Head: Normocephalic and atraumatic.  Eyes: EOM are normal. Pupils are equal, round, and reactive to light.  Neck: Neck supple.  No carotid bruit  Cardiovascular: Normal rate, regular rhythm, normal heart sounds and intact distal pulses.   No murmur heard. Respiratory: Effort normal and breath sounds normal.  GI: Soft. There is no tenderness.  Musculoskeletal: She exhibits no edema.  Neurological: She is alert and oriented to person, place, and time. No cranial nerve deficit.  Exam limited- on cath table  Skin: Skin is warm and dry.    Assessment/Plan: 74 yo woman with an STEMI secondary to LAD disease. She has a thrombus in the proximal LAD that could potentially compromise the left main and/ or circumflex if ballooned. CABNG is indicated for survival benefit adn relief of symptoms.  I have discussed the general nature of the procedure, the need for general anesthesia, and the incisions to be used with [name]. I also discussed these issues with her daughter and  son(by phone). I reviewed the indications, risks, benefits and alternatives. They understand the risks include, but are not limited to death, stroke, MI, DVT/PE, bleeding, possible need for transfusion, infections, cardiac arrhythmias,  and other organ system dysfunction including respiratory, renal, or GI complications.   She accepts the risks and agree to proceed.  The OR has been notified  Melrose Nakayama 05/23/2015, 12:05 AM

## 2015-05-24 ENCOUNTER — Inpatient Hospital Stay (HOSPITAL_COMMUNITY): Payer: Medicare Other

## 2015-05-24 ENCOUNTER — Encounter (HOSPITAL_COMMUNITY): Payer: Self-pay | Admitting: Interventional Cardiology

## 2015-05-24 DIAGNOSIS — I2101 ST elevation (STEMI) myocardial infarction involving left main coronary artery: Secondary | ICD-10-CM

## 2015-05-24 LAB — GLUCOSE, CAPILLARY
GLUCOSE-CAPILLARY: 102 mg/dL — AB (ref 65–99)
GLUCOSE-CAPILLARY: 111 mg/dL — AB (ref 65–99)
Glucose-Capillary: 182 mg/dL — ABNORMAL HIGH (ref 65–99)
Glucose-Capillary: 147 mg/dL — ABNORMAL HIGH (ref 65–99)
Glucose-Capillary: 172 mg/dL — ABNORMAL HIGH (ref 65–99)
Glucose-Capillary: 144 mg/dL — ABNORMAL HIGH (ref 65–99)
Glucose-Capillary: 166 mg/dL — ABNORMAL HIGH (ref 65–99)
Glucose-Capillary: 166 mg/dL — ABNORMAL HIGH (ref 65–99)
Glucose-Capillary: 108 mg/dL — ABNORMAL HIGH (ref 65–99)
Glucose-Capillary: 119 mg/dL — ABNORMAL HIGH (ref 65–99)

## 2015-05-24 LAB — HEMOGLOBIN A1C
HEMOGLOBIN A1C: 5.7 % — AB (ref 4.8–5.6)
Mean Plasma Glucose: 117 mg/dL

## 2015-05-24 LAB — CBC
HCT: 32.8 % — ABNORMAL LOW (ref 36.0–46.0)
Hemoglobin: 10.4 g/dL — ABNORMAL LOW (ref 12.0–15.0)
MCH: 27 pg (ref 26.0–34.0)
MCHC: 31.7 g/dL (ref 30.0–36.0)
MCV: 85.2 fL (ref 78.0–100.0)
Platelets: 240 K/uL (ref 150–400)
RBC: 3.85 MIL/uL — ABNORMAL LOW (ref 3.87–5.11)
RDW: 13.6 % (ref 11.5–15.5)
WBC: 14.4 K/uL — ABNORMAL HIGH (ref 4.0–10.5)

## 2015-05-24 LAB — BASIC METABOLIC PANEL WITH GFR
Anion gap: 5 (ref 5–15)
BUN: 12 mg/dL (ref 6–20)
CO2: 27 mmol/L (ref 22–32)
Calcium: 7.4 mg/dL — ABNORMAL LOW (ref 8.9–10.3)
Chloride: 108 mmol/L (ref 101–111)
Creatinine, Ser: 0.7 mg/dL (ref 0.44–1.00)
GFR calc Af Amer: >60 mL/min
GFR calc non Af Amer: >60 mL/min
Glucose, Bld: 130 mg/dL — ABNORMAL HIGH (ref 65–99)
Potassium: 4.1 mmol/L (ref 3.5–5.1)
Sodium: 140 mmol/L (ref 135–145)

## 2015-05-24 LAB — TROPONIN I: TROPONIN I: 7.73 ng/mL — AB (ref <0.031)

## 2015-05-24 LAB — MRSA PCR SCREENING: MRSA by PCR: NEGATIVE

## 2015-05-24 MED ORDER — INSULIN ASPART 100 UNIT/ML ~~LOC~~ SOLN
0.0000 [IU] | Freq: Three times a day (TID) | SUBCUTANEOUS | Status: DC
  Administered 2015-05-26: 3 [IU] via SUBCUTANEOUS
  Administered 2015-05-28: 2 [IU] via SUBCUTANEOUS

## 2015-05-24 MED ORDER — CALCIPOTRIENE 0.005 % EX CREA
1.0000 "application " | TOPICAL_CREAM | Freq: Two times a day (BID) | CUTANEOUS | Status: DC
  Administered 2015-05-25 – 2015-05-27 (×4): 1 via TOPICAL

## 2015-05-24 MED ORDER — METOPROLOL TARTRATE 25 MG PO TABS
25.0000 mg | ORAL_TABLET | Freq: Two times a day (BID) | ORAL | Status: DC
  Administered 2015-05-24 – 2015-05-25 (×4): 25 mg via ORAL
  Filled 2015-05-24 (×6): qty 1

## 2015-05-24 MED ORDER — SODIUM CHLORIDE 0.9 % IV SOLN: 250.0000 mL | INTRAVENOUS | Status: DC | PRN

## 2015-05-24 MED ORDER — SODIUM CHLORIDE 0.9 % IJ SOLN: 3.0000 mL | INTRAMUSCULAR | Status: DC | PRN

## 2015-05-24 MED ORDER — ALUM & MAG HYDROXIDE-SIMETH 200-200-20 MG/5ML PO SUSP: 15.0000 mL | ORAL | Status: DC | PRN

## 2015-05-24 MED ORDER — SODIUM CHLORIDE 0.9 % IJ SOLN
3.0000 mL | Freq: Two times a day (BID) | INTRAMUSCULAR | Status: DC
  Administered 2015-05-24 – 2015-05-28 (×8): 3 mL via INTRAVENOUS

## 2015-05-24 MED ORDER — ATORVASTATIN CALCIUM 40 MG PO TABS
40.0000 mg | ORAL_TABLET | Freq: Every day | ORAL | Status: DC
  Administered 2015-05-24 – 2015-05-27 (×4): 40 mg via ORAL
  Filled 2015-05-24 (×5): qty 1

## 2015-05-24 MED ORDER — METOPROLOL TARTRATE 25 MG/10 ML ORAL SUSPENSION
25.0000 mg | Freq: Two times a day (BID) | ORAL | Status: DC
  Filled 2015-05-24 (×6): qty 10

## 2015-05-24 MED ORDER — MOVING RIGHT ALONG BOOK
Freq: Once | Status: AC
  Administered 2015-05-26: 11:00:00
  Filled 2015-05-24: qty 1

## 2015-05-24 MED ORDER — DIPHENHYDRAMINE HCL 25 MG PO CAPS: 25.0000 mg | ORAL_CAPSULE | Freq: Every evening | ORAL | Status: DC | PRN

## 2015-05-24 MED ORDER — MAGNESIUM HYDROXIDE 400 MG/5ML PO SUSP: 30.0000 mL | Freq: Every day | ORAL | Status: DC | PRN

## 2015-05-24 MED ORDER — ALPRAZOLAM 0.25 MG PO TABS: 0.2500 mg | ORAL_TABLET | Freq: Four times a day (QID) | ORAL | Status: DC | PRN

## 2015-05-24 MED FILL — Lidocaine HCl IV Inj 20 MG/ML: INTRAVENOUS | Qty: 5 | Status: AC

## 2015-05-24 MED FILL — Potassium Chloride Inj 2 mEq/ML: INTRAVENOUS | Qty: 40 | Status: AC

## 2015-05-24 MED FILL — Heparin Sodium (Porcine) 2 Unit/ML in Sodium Chloride 0.9%: INTRAMUSCULAR | Qty: 1500 | Status: AC

## 2015-05-24 MED FILL — Heparin Sodium (Porcine) Inj 1000 Unit/ML: INTRAMUSCULAR | Qty: 2500 | Status: AC

## 2015-05-24 MED FILL — Nitroglycerin IV Soln 100 MCG/ML in D5W: INTRA_ARTERIAL | Qty: 10 | Status: AC

## 2015-05-24 MED FILL — Heparin Sodium (Porcine) Inj 1000 Unit/ML: INTRAMUSCULAR | Qty: 30 | Status: AC

## 2015-05-24 MED FILL — Mannitol IV Soln 20%: INTRAVENOUS | Qty: 500 | Status: AC

## 2015-05-24 MED FILL — Electrolyte-R (PH 7.4) Solution: INTRAVENOUS | Qty: 4000 | Status: AC

## 2015-05-24 MED FILL — Magnesium Sulfate Inj 50%: INTRAMUSCULAR | Qty: 10 | Status: AC

## 2015-05-24 MED FILL — Lidocaine HCl Local Preservative Free (PF) Inj 1%: INTRAMUSCULAR | Qty: 30 | Status: AC

## 2015-05-24 MED FILL — Sodium Chloride IV Soln 0.9%: INTRAVENOUS | Qty: 2000 | Status: AC

## 2015-05-24 MED FILL — Heparin Sodium (Porcine) Inj 1000 Unit/ML: INTRAMUSCULAR | Qty: 10 | Status: AC

## 2015-05-24 NOTE — Progress Notes (Signed)
2 Days Post-Op Procedure(s) (LRB): CORONARY ARTERY BYPASS GRAFTING (CABG) (N/A) TRANSESOPHAGEAL ECHOCARDIOGRAM (TEE) Subjective: Some incisional discomfort  Objective: Vital signs in last 24 hours: Temp:  [95.9 F (35.5 C)-98.5 F (36.9 C)] 98.1 F (36.7 C) (06/27 0404) Pulse Rate:  [90-107] 96 (06/27 0630) Cardiac Rhythm:  [-] Normal sinus rhythm (06/27 0600) Resp:  [14-27] 19 (06/27 0630) BP: (89-141)/(61-83) 137/77 mmHg (06/27 0600) SpO2:  [91 %-99 %] 96 % (06/27 0630) Arterial Line BP: (100-153)/(43-75) 149/73 mmHg (06/26 1800) FiO2 (%):  [40 %] 40 % (06/26 0815) Weight:  [145 lb 8.1 oz (66 kg)] 145 lb 8.1 oz (66 kg) (06/27 0500)  Hemodynamic parameters for last 24 hours: PAP: (19-31)/(8-21) 27/18 mmHg CO:  [3 L/min-3.5 L/min] 3.4 L/min CI:  [1.9 L/min/m2-2.2 L/min/m2] 2.1 L/min/m2  Intake/Output from previous day: 06/26 0701 - 06/27 0700 In: 1465.7 [P.O.:210; I.V.:525.7; NG/GT:30; IV Piggyback:700] Out: 2305 [Urine:2195; Emesis/NG output:50; Chest Tube:60] Intake/Output this shift:    General appearance: alert and no distress Neurologic: intact Heart: regular rate and rhythm Lungs: diminished breath sounds left base Abdomen: normal findings: soft, non-tender  Lab Results:  Recent Labs  05/23/15 1000  05/23/15 1832 05/24/15 0414  WBC 15.2*  --   --  14.4*  HGB 11.1*  < > 11.2* 10.4*  HCT 34.0*  < > 33.0* 32.8*  PLT 213  --   --  240  < > = values in this interval not displayed. BMET:  Recent Labs  05/23/15 0503  05/23/15 1111 05/23/15 1832 05/24/15 0414  NA 141  --  144 143 140  K 3.5  --  3.3* 4.1 4.1  CL 111  --  108  --  108  CO2 24  --   --   --  27  GLUCOSE 138*  --  166* 145* 130*  BUN 10  --  8  --  12  CREATININE 0.74  < > 0.50  --  0.70  CALCIUM 6.5*  --   --   --  7.4*  < > = values in this interval not displayed.  PT/INR:  Recent Labs  05/23/15 1715  LABPROT 16.4*  INR 1.31   ABG    Component Value Date/Time   PHART 7.372  05/23/2015 1008   HCO3 23.0 05/23/2015 1008   TCO2 22 05/23/2015 1111   ACIDBASEDEF 2.0 05/23/2015 1008   O2SAT 93.0 05/23/2015 1008   CBG (last 3)   Recent Labs  05/23/15 2010 05/23/15 2333 05/24/15 0406  GLUCAP 105* 129* 108*    Assessment/Plan: S/P Procedure(s) (LRB): CORONARY ARTERY BYPASS GRAFTING (CABG) (N/A) TRANSESOPHAGEAL ECHOCARDIOGRAM (TEE) Plan for transfer to step-down: see transfer orders  Doing well POD # 2  CV- s/p STEMI, stable  ASA, beta blocker statin, add ACE-I prior to dc if BP allows  RESP- left lower lobe atelectasis on CXR- IS  RENAL- creatinine and lytes OK, diuresed spontaneously yesterday  ENDO- CBG better, refused levemir, change to AC/HS  DVT prophylaxis- SCD + enoxaparin  Continue ambulation  Transfer 2 west     LOS: 1 day    Loreli SlotSteven C Hutch Rhett 05/24/2015

## 2015-05-24 NOTE — Progress Notes (Signed)
     NSTEMI CABG Consider adding Plavix when comfortable from bleeding perspective.   Donato Schultz, MD

## 2015-05-24 NOTE — Progress Notes (Signed)
Pt to bring home dovonox, pharmacy does not carry medication. Louie BunWilson,Avril Busser S . 9:07 AM

## 2015-05-24 NOTE — Progress Notes (Signed)
[id number] Came to see pt to walk. Pt feeling nauseated. Gave gingerale, cool cloth and crackers. Discussed importance of IS and activity. Questions answered re CRP 2. Discussed CRP 2 [facility]. Gave sons brochure. Discussed that pt will need someone with her 24/7 first week home.  Encouraged pt to walk with staff later if feeling better. Luetta NuttingCharlene Sharon Rubis RN BSN 05/24/2015 3:08 PM

## 2015-05-24 NOTE — Progress Notes (Signed)
[facility]

## 2015-05-24 NOTE — Anesthesia Postprocedure Evaluation (Signed)
  Anesthesia Post-op Note  Patient: Dana Palmer  Procedure(s) Performed: Procedure(s): CORONARY ARTERY BYPASS GRAFTING (CABG) (N/A) TRANSESOPHAGEAL ECHOCARDIOGRAM (TEE)  Patient Location: ICU  Anesthesia Type:General  Level of Consciousness: sedated  Airway and Oxygen Therapy: Patient remains intubated per anesthesia plan  Post-op Pain: none  Post-op Assessment: Post-op Vital signs reviewed, Patient's Cardiovascular Status Stable, Respiratory Function Stable, Patent Airway and Pain level controlled              Post-op Vital Signs: Reviewed and stable  Last Vitals:  Filed Vitals:   05/24/15 1000  BP: 141/76  Pulse:   Temp:   Resp:     Complications: No apparent anesthesia complications

## 2015-05-24 NOTE — Progress Notes (Signed)
Report called to [place] on [facility]. 10:06 AM

## 2015-05-25 ENCOUNTER — Inpatient Hospital Stay (HOSPITAL_COMMUNITY): Payer: Medicare Other

## 2015-05-25 DIAGNOSIS — I213 ST elevation (STEMI) myocardial infarction of unspecified site: Secondary | ICD-10-CM

## 2015-05-25 LAB — CBC
HCT: 33 % — ABNORMAL LOW (ref 36.0–46.0)
Hemoglobin: 10.7 g/dL — ABNORMAL LOW (ref 12.0–15.0)
MCH: 27.6 pg (ref 26.0–34.0)
MCHC: 32.4 g/dL (ref 30.0–36.0)
MCV: 85.3 fL (ref 78.0–100.0)
Platelets: 225 10*3/uL (ref 150–400)
RBC: 3.87 MIL/uL (ref 3.87–5.11)
RDW: 13.6 % (ref 11.5–15.5)
WBC: 12 10*3/uL — AB (ref 4.0–10.5)

## 2015-05-25 LAB — BASIC METABOLIC PANEL
Anion gap: 9 (ref 5–15)
BUN: 12 mg/dL (ref 6–20)
CO2: 26 mmol/L (ref 22–32)
CREATININE: 0.57 mg/dL (ref 0.44–1.00)
Calcium: 8.1 mg/dL — ABNORMAL LOW (ref 8.9–10.3)
Chloride: 103 mmol/L (ref 101–111)
GFR calc Af Amer: >60 mL/min (ref >60)
Glucose, Bld: 110 mg/dL — ABNORMAL HIGH (ref 65–99)
Potassium: 3.9 mmol/L (ref 3.5–5.1)
Sodium: 138 mmol/L (ref 135–145)

## 2015-05-25 LAB — GLUCOSE, CAPILLARY
GLUCOSE-CAPILLARY: 103 mg/dL — AB (ref 65–99)
GLUCOSE-CAPILLARY: 87 mg/dL (ref 65–99)
Glucose-Capillary: 102 mg/dL — ABNORMAL HIGH (ref 65–99)
Glucose-Capillary: 150 mg/dL — ABNORMAL HIGH (ref 65–99)

## 2015-05-25 MED ORDER — LISINOPRIL 5 MG PO TABS
5.0000 mg | ORAL_TABLET | Freq: Every day | ORAL | Status: DC
  Administered 2015-05-25 – 2015-05-28 (×4): 5 mg via ORAL
  Filled 2015-05-25 (×4): qty 1

## 2015-05-25 MED ORDER — FUROSEMIDE 10 MG/ML IJ SOLN
40.0000 mg | Freq: Once | INTRAMUSCULAR | Status: AC
  Administered 2015-05-25: 40 mg via INTRAVENOUS
  Filled 2015-05-25: qty 4

## 2015-05-25 NOTE — Care Management (Signed)
Important Message  Patient Details  Name: Dana Palmer MRN: [id number] Date of Birth: [birth year]   Medicare Important Message Given:  Yes-second notification given    Kyla BalzarineShealy, Dru Primeau Abena 05/25/2015, 1:25 PM

## 2015-05-25 NOTE — Progress Notes (Signed)
      [address]       [place] [id number]             [phone]      3 Days Post-Op Procedure(s) (LRB): CORONARY ARTERY BYPASS GRAFTING (CABG) (N/A) TRANSESOPHAGEAL ECHOCARDIOGRAM (TEE)   Subjective:  Ms. Joelene MillinOliver has complains of shortness of breath this morning.  She states its worse when she is lying down.  Objective: Vital signs in last 24 hours: Temp:  [97.7 F (36.5 C)-98.6 F (37 C)] 98 F (36.7 C) (06/28 0448) Pulse Rate:  [90-112] 106 (06/28 0448) Cardiac Rhythm:  [-] Sinus tachycardia (06/27 2030) Resp:  [18] 18 (06/28 0448) BP: (137-160)/(73-80) 160/80 mmHg (06/28 0448) SpO2:  [96 %-98 %] 96 % (06/28 0448) Weight:  [145 lb 11.6 oz (66.1 kg)] 145 lb 11.6 oz (66.1 kg) (06/28 0448)  Intake/Output from previous day: 06/27 0701 - 06/28 0700 In: 240 [P.O.:240] Out: 150 [Urine:150]  General appearance: alert, cooperative and no distress Heart: regular rate and rhythm Lungs: diminished breath sounds bibasilar Abdomen: soft, non-tender; bowel sounds normal; no masses,  no organomegaly Extremities: edema trace Wound: clean and dry  Lab Results:  Recent Labs  05/24/15 0414 05/25/15 0600  WBC 14.4* 12.0*  HGB 10.4* 10.7*  HCT 32.8* 33.0*  PLT 240 225   BMET:  Recent Labs  05/24/15 0414 05/25/15 0600  NA 140 138  K 4.1 3.9  CL 108 103  CO2 27 26  GLUCOSE 130* 110*  BUN 12 12  CREATININE 0.70 0.57  CALCIUM 7.4* 8.1*    PT/INR:  Recent Labs  05/23/15 1715  LABPROT 16.4*  INR 1.31   ABG    Component Value Date/Time   PHART 7.372 05/23/2015 1008   HCO3 23.0 05/23/2015 1008   TCO2 22 05/23/2015 1111   ACIDBASEDEF 2.0 05/23/2015 1008   O2SAT 93.0 05/23/2015 1008   CBG (last 3)   Recent Labs  05/24/15 1644 05/24/15 2124 05/25/15 0607  GLUCAP 102* 111* 103*    Assessment/Plan: S/P Procedure(s) (LRB): CORONARY ARTERY BYPASS GRAFTING (CABG) (N/A) TRANSESOPHAGEAL ECHOCARDIOGRAM (TEE)  1. CV- Sinus Tach, +  Hypertension- continue Lopressor, will start Lisinopril 2. Pulm- on oxygen, wean as tolerated, patient with moderate pleural effusions, may benefit from Thoracentesis 3. Renal- creatinine WNL, remains hypervolemic, will give IV Lasix today 4. CBGs, controlled, no A1c 5. Dispo- patient stable, +dyspnea/pleural effusions will start Lasix, continue current care   LOS: 2 days    Raford PitcherBARRETT, Denny PeonRIN 05/25/2015

## 2015-05-25 NOTE — Care Management Note (Signed)
Case Management Note  Patient Details  Name: Dana Palmer MRN: [id number] Date of Birth: 12/24/1940  Subjective/Objective:  Pt admitted with STEMI, s/p CABG                 Action/Plan: PTA pt lived at home with spouse- anticipate return home- NCM to follow  Expected Discharge Date:                  Expected Discharge Plan:  Home w Home Health Services  In-House Referral:     Discharge planning Services     Post Acute Care Choice:    Choice offered to:     DME Arranged:    DME Agency:     HH Arranged:    HH Agency:     Status of Service:  In process, will continue to follow  Medicare Important Message Given:    Date Medicare IM Given:    Medicare IM give by:    Date Additional Medicare IM Given:    Additional Medicare Important Message give by:     If discussed at Long Length of Stay Meetings, dates discussed:    Additional Comments:  Darrold SpanWebster, Maty Zeisler Hall, RN 05/25/2015, 10:29 AM

## 2015-05-25 NOTE — Progress Notes (Signed)
CARDIAC REHAB PHASE I   PRE:  Rate/Rhythm: 112 ST  BP:  Supine:   Sitting: 167/83  Standing:    SaO2: 96% 3L, 95% 2L  MODE:  Ambulation: 510 ft   POST:  Rate/Rhythm: 115-122 ST  BP:  Supine:   Sitting: 146/66  Standing:    SaO2: 94% 2L hall, 91-95% 2L room [phone] Pt walked 510 ft on 2L with rolling walker, asst x 1 with fast pace. Encouraged pt to stay close to walker. Tolerated well. Stopped once so I could check her sats. Pt stated felt SOB after walk. Encouraged pursed-lip breathing. When sats registered after walk 91-95%. Left on 2L. Encouraged IS. To recliner.   Dana Nuttingharlene Temiloluwa Recchia, RN BSN  05/25/2015 9:27 AM

## 2015-05-26 LAB — GLUCOSE, CAPILLARY
GLUCOSE-CAPILLARY: 155 mg/dL — AB (ref 65–99)
Glucose-Capillary: 94 mg/dL (ref 65–99)
Glucose-Capillary: 77 mg/dL (ref 65–99)
Glucose-Capillary: 87 mg/dL (ref 65–99)

## 2015-05-26 MED ORDER — POTASSIUM CHLORIDE CRYS ER 20 MEQ PO TBCR
20.0000 meq | EXTENDED_RELEASE_TABLET | Freq: Once | ORAL | Status: AC
  Administered 2015-05-26: 20 meq via ORAL
  Filled 2015-05-26: qty 1

## 2015-05-26 MED ORDER — METOPROLOL TARTRATE 50 MG PO TABS
50.0000 mg | ORAL_TABLET | Freq: Two times a day (BID) | ORAL | Status: DC
  Administered 2015-05-26 – 2015-05-28 (×4): 50 mg via ORAL
  Filled 2015-05-26 (×6): qty 1

## 2015-05-26 MED ORDER — METOPROLOL TARTRATE 25 MG/10 ML ORAL SUSPENSION
25.0000 mg | Freq: Two times a day (BID) | ORAL | Status: DC
  Administered 2015-05-26: 25 mg
  Filled 2015-05-26 (×4): qty 10

## 2015-05-26 MED ORDER — FUROSEMIDE 10 MG/ML IJ SOLN
40.0000 mg | Freq: Once | INTRAMUSCULAR | Status: AC
  Administered 2015-05-26: 40 mg via INTRAVENOUS
  Filled 2015-05-26: qty 4

## 2015-05-26 NOTE — Progress Notes (Signed)
CARDIAC REHAB PHASE I   PRE:  Rate/Rhythm: 105 ST    BP: sitting 131/80    SaO2: 96 2L, 93 RA  MODE:  Ambulation: 800 ft   POST:  Rate/Rhythm: 121 ST    BP: sitting 147/70     SaO2: 96 RA  Pt moving very well around room, independently. Able to walk without RW or O2 for long distance. Pt sts she is not very SOB. No assist needed. SaO2 96 RA after walk. HR 121 ST. Encouraged pt to keep feet elevated.   [id number] Elissa LovettReeve, Denece Shearer MondoviKristan CES, ACSM 05/26/2015 2:11 PM

## 2015-05-26 NOTE — Progress Notes (Addendum)
[address]       [place], [id number]             [phone]      4 Days Post-Op Procedure(s) (LRB): CORONARY ARTERY BYPASS GRAFTING (CABG) (N/A) TRANSESOPHAGEAL ECHOCARDIOGRAM (TEE) Subjective:l Loose stools following sorbitol, in sinus tach 120-130's. Feels pretty well overall, not signif SOB  Objective: Vital signs in last 24 hours: Temp:  [98.1 F (36.7 C)-98.6 F (37 C)] 98.4 F (36.9 C) (06/29 0516) Pulse Rate:  [107-117] 111 (06/29 0516) Cardiac Rhythm:  [-] Normal sinus rhythm;Sinus tachycardia (06/28 2150) Resp:  [18] 18 (06/29 0516) BP: (131-157)/(66-87) 131/72 mmHg (06/29 0516) SpO2:  [94 %-95 %] 95 % (06/29 0516) Weight:  [141 lb 5 oz (64.1 kg)] 141 lb 5 oz (64.1 kg) (06/29 0516)  Hemodynamic parameters for last 24 hours:    Intake/Output from previous day: 06/28 0701 - 06/29 0700 In: 720 [P.O.:720] Out: -  Intake/Output this shift:    General appearance: alert, cooperative and no distress Heart: regular rate and rhythm and tachy Lungs: mildly dim in the left base Abdomen: soft, nontender Extremities: + LE edema Wound: incis healing well  Lab Results:  Recent Labs  05/24/15 0414 05/25/15 0600  WBC 14.4* 12.0*  HGB 10.4* 10.7*  HCT 32.8* 33.0*  PLT 240 225   BMET:  Recent Labs  05/24/15 0414 05/25/15 0600  NA 140 138  K 4.1 3.9  CL 108 103  CO2 27 26  GLUCOSE 130* 110*  BUN 12 12  CREATININE 0.70 0.57  CALCIUM 7.4* 8.1*    PT/INR:  Recent Labs  05/23/15 1715  LABPROT 16.4*  INR 1.31   ABG    Component Value Date/Time   PHART 7.372 05/23/2015 1008   HCO3 23.0 05/23/2015 1008   TCO2 22 05/23/2015 1111   ACIDBASEDEF 2.0 05/23/2015 1008   O2SAT 93.0 05/23/2015 1008   CBG (last 3)   Recent Labs  05/25/15 1627 05/25/15 2037 05/26/15 0555  GLUCAP 102* 150* 94    Meds Scheduled Meds: . acetaminophen  1,000 mg Oral 4 times per day   Or  . acetaminophen (TYLENOL) oral liquid 160 mg/5 mL  1,000  mg Per Tube 4 times per day  . aspirin EC  325 mg Oral Daily   Or  . aspirin  324 mg Per Tube Daily  . atorvastatin  40 mg Oral q1800  . bisacodyl  10 mg Oral Daily   Or  . bisacodyl  10 mg Rectal Daily  . calcipotriene  1 application Topical BID  . clobetasol cream  1 application Topical BID  . docusate sodium  200 mg Oral Daily  . enoxaparin (LOVENOX) injection  40 mg Subcutaneous QHS  . insulin aspart  0-15 Units Subcutaneous TID WC  . lisinopril  5 mg Oral Daily  . metoprolol tartrate  25 mg Oral BID   Or  . metoprolol tartrate  25 mg Per Tube BID  . moving right along book   Does not apply Once  . pantoprazole  40 mg Oral Daily  . sodium chloride  3 mL Intravenous Q12H   Continuous Infusions:  PRN Meds:.sodium chloride, ALPRAZolam, alum & mag hydroxide-simeth, diphenhydrAMINE, magnesium hydroxide, ondansetron (ZOFRAN) IV, oxyCODONE, sodium chloride, traMADol  Xrays Dg Chest 2 View  05/25/2015   CLINICAL DATA:  Shortness of Breath  EXAM: CHEST - 2 VIEW  COMPARISON:  05/24/2015  FINDINGS: Cardiac shadow is stable. Persistent left basilar  atelectasis and effusion are seen. No pneumothorax is noted. A calcified granuloma is again noted in the left mid lung. Small right pleural effusion is stable.  IMPRESSION: Bilateral pleural effusions stable from the prior exam. Left basilar atelectasis is noted.   Electronically Signed   By: Alcide Clever M.D.   On: 05/25/2015 07:27    Assessment/Plan: S/P Procedure(s) (LRB): CORONARY ARTERY BYPASS GRAFTING (CABG) (N/A) TRANSESOPHAGEAL ECHOCARDIOGRAM (TEE)  1 Can d/c laxatives at this time 2 there is room to increase beta blocker 3 sugars well controlled, no new labs 4 cont diuresis for volume overload 5 repeat cxr in am to f/u on left effusion 6 push rehab and pulm toilet as able  LOS: 3 days    GOLD,WAYNE E 05/26/2015  I have seen and examined the patient and agree with the assessment and plan as outlined.  Purcell Nails 05/26/2015 5:35 PM

## 2015-05-27 ENCOUNTER — Inpatient Hospital Stay (HOSPITAL_COMMUNITY): Payer: Medicare Other

## 2015-05-27 LAB — GLUCOSE, CAPILLARY
Glucose-Capillary: 93 mg/dL (ref 65–99)
Glucose-Capillary: 120 mg/dL — ABNORMAL HIGH (ref 65–99)
Glucose-Capillary: 98 mg/dL (ref 65–99)

## 2015-05-27 LAB — BASIC METABOLIC PANEL
Anion gap: 11 (ref 5–15)
BUN: 12 mg/dL (ref 6–20)
CHLORIDE: 101 mmol/L (ref 101–111)
CO2: 30 mmol/L (ref 22–32)
CREATININE: 0.71 mg/dL (ref 0.44–1.00)
Calcium: 8.2 mg/dL — ABNORMAL LOW (ref 8.9–10.3)
GFR calc non Af Amer: >60 mL/min (ref >60)
Glucose, Bld: 99 mg/dL (ref 65–99)
POTASSIUM: 2.9 mmol/L — AB (ref 3.5–5.1)
Sodium: 142 mmol/L (ref 135–145)

## 2015-05-27 LAB — TSH: TSH: 5.5 u[IU]/mL — ABNORMAL HIGH (ref 0.350–4.500)

## 2015-05-27 MED ORDER — METOPROLOL TARTRATE 25 MG PO TABS
25.0000 mg | ORAL_TABLET | Freq: Once | ORAL | Status: AC
  Administered 2015-05-27: 25 mg via ORAL
  Filled 2015-05-27: qty 1

## 2015-05-27 MED ORDER — POTASSIUM CHLORIDE CRYS ER 20 MEQ PO TBCR
40.0000 meq | EXTENDED_RELEASE_TABLET | Freq: Two times a day (BID) | ORAL | Status: DC
  Administered 2015-05-27 – 2015-05-28 (×3): 40 meq via ORAL
  Filled 2015-05-27 (×4): qty 2

## 2015-05-27 MED ORDER — FUROSEMIDE 40 MG PO TABS
40.0000 mg | ORAL_TABLET | Freq: Every day | ORAL | Status: DC
  Administered 2015-05-27 – 2015-05-28 (×2): 40 mg via ORAL
  Filled 2015-05-27 (×2): qty 1

## 2015-05-27 NOTE — Care Management Note (Addendum)
Case Management Note  Patient Details  Name: Dana DuverneyMarti Parodi MRN: [id number] Date of Birth: Sep 20, 1941  Subjective/Objective:      Pt admitted with STEMI      Action/Plan:  Pt is from home with live in daughter and grandson.  Pt states she already has shower chair and walker in the home.  CM will continue to monitor for disposition plan   Expected Discharge Date:                  Expected Discharge Plan:  Home w Home Health Services  In-House Referral:     Discharge planning Services     Post Acute Care Choice:    Choice offered to:     DME Arranged:    DME Agency:     HH Arranged:    HH Agency:     Status of Service:  In process, will continue to follow  Medicare Important Message Given:  Yes-second notification given Date Medicare IM Given:    Medicare IM give by:    Date Additional Medicare IM Given:    Additional Medicare Important Message give by:     If discussed at Long Length of Stay Meetings, dates discussed:  05/27/2015   Additional Comments: CM assessed pt; pt states that she will have 24 hour supervision upon discharge, care will be provided by family.  Pt [facility], CM placed sticky note in Epic for MD.  CM will continue to monitor for disposition needs Cherylann ParrClaxton, Louvina Cleary S, RN 05/27/2015, 12:50 PM

## 2015-05-27 NOTE — Progress Notes (Addendum)
[address]       [place] [address]             [phone]      5 Days Post-Op Procedure(s) (LRB): CORONARY ARTERY BYPASS GRAFTING (CABG) (N/A) TRANSESOPHAGEAL ECHOCARDIOGRAM (TEE) Subjective: Feels much better, off O2  Objective: Vital signs in last 24 hours: Temp:  [98.4 F (36.9 C)-99.6 F (37.6 C)] 99.6 F (37.6 C) (06/30 0456) Pulse Rate:  [110-112] 112 (06/30 0456) Cardiac Rhythm:  [-] Sinus tachycardia (06/29 2000) Resp:  [18] 18 (06/30 0456) BP: (112-132)/(71-73) 112/73 mmHg (06/30 0456) SpO2:  [93 %-97 %] 93 % (06/30 0456) Weight:  [139 lb 5.3 oz (63.2 kg)] 139 lb 5.3 oz (63.2 kg) (06/30 0456)  Hemodynamic parameters for last 24 hours:    Intake/Output from previous day: 06/29 0701 - 06/30 0700 In: 240 [P.O.:240] Out: 1 [Stool:1] Intake/Output this shift:    General appearance: alert, cooperative and no distress Heart: regular rate and rhythm and tachy Lungs: dim in left base Abdomen: benign Extremities: + edema LE Wound: incis healing well  Lab Results:  Recent Labs  05/25/15 0600  WBC 12.0*  HGB 10.7*  HCT 33.0*  PLT 225   BMET:  Recent Labs  05/25/15 0600 05/27/15 0327  NA 138 142  K 3.9 2.9*  CL 103 101  CO2 26 30  GLUCOSE 110* 99  BUN 12 12  CREATININE 0.57 0.71  CALCIUM 8.1* 8.2*    PT/INR: No results for input(s): LABPROT, INR in the last 72 hours. ABG    Component Value Date/Time   PHART 7.372 05/23/2015 1008   HCO3 23.0 05/23/2015 1008   TCO2 22 05/23/2015 1111   ACIDBASEDEF 2.0 05/23/2015 1008   O2SAT 93.0 05/23/2015 1008   CBG (last 3)   Recent Labs  05/26/15 1636 05/26/15 2109 05/27/15 0631  GLUCAP 155* 87 93    Meds Scheduled Meds: . acetaminophen  1,000 mg Oral 4 times per day   Or  . acetaminophen (TYLENOL) oral liquid 160 mg/5 mL  1,000 mg Per Tube 4 times per day  . aspirin EC  325 mg Oral Daily   Or  . aspirin  324 mg Per Tube Daily  . atorvastatin  40 mg Oral q1800  .  calcipotriene  1 application Topical BID  . clobetasol cream  1 application Topical BID  . enoxaparin (LOVENOX) injection  40 mg Subcutaneous QHS  . insulin aspart  0-15 Units Subcutaneous TID WC  . lisinopril  5 mg Oral Daily  . metoprolol tartrate  50 mg Oral BID   Or  . metoprolol tartrate  25 mg Per Tube BID  . pantoprazole  40 mg Oral Daily  . sodium chloride  3 mL Intravenous Q12H   Continuous Infusions:  PRN Meds:.sodium chloride, ALPRAZolam, alum & mag hydroxide-simeth, diphenhydrAMINE, ondansetron (ZOFRAN) IV, oxyCODONE, sodium chloride, traMADol  Xrays No results found.  Assessment/Plan: S/P Procedure(s) (LRB): CORONARY ARTERY BYPASS GRAFTING (CABG) (N/A) TRANSESOPHAGEAL ECHOCARDIOGRAM (TEE)  1 Improving 2 cont diuresis, she is borderline for needing left thoracentesis but since now off O2 can cont to observe. Reolace K+ 3 still tachy- may need to increase beta blocker further 4 d/c epw's 5 poss d/c in am     LOS: 4 days    GOLD,WAYNE E 05/27/2015  I have seen and examined the patient and agree with the assessment and plan as outlined.  Looks good.  Increase metoprolol.  Tentatively plan d/c  home tomorrow.  Purcell NailsClarence H Trudi Morgenthaler 05/27/2015 5:15 PM

## 2015-05-27 NOTE — Progress Notes (Signed)
UR Completed. Vella Colquitt, RN, BSN.  [phone] 

## 2015-05-27 NOTE — Progress Notes (Signed)
CARDIAC REHAB PHASE I   PRE:  Rate/Rhythm: 118 ST  BP:  Sitting: 139/89        SaO2: 95 RA  MODE:  Ambulation: 550 ft   POST:  Rate/Rhythm: 135 ST  BP:  Sitting: 125/99         SaO2: 94 RA  Pt ambulated 550 ft on RA, independent, brisk, steady gait, tolerated well. Pt still tachycardic, HR 118 at rest, HR 130-137 while ambulating in hallway, O2 94% on RA. Pt c/o shortness of breath during ambulation, denies CP, dizziness. Standing rest x 1.  Dyspnea improved with rest, HR decreased to 118 after 5 minutes rest. RN aware. Pt to side of bed after walk, call bell within reach.  [id number]   Joylene GrapesMonge, Brielyn Bosak C, RN, BSN 05/27/2015 11:14 AM

## 2015-05-27 NOTE — Discharge Summary (Signed)
[address]       [place] [address]             [phone]              Discharge Summary  Name: Dana Palmer DOB: Jul 13, 1941 74 y.o. MRN: [id number]   Admission Date: 05/22/2015 Discharge Date:     Admitting Diagnosis: Acute ST elevation myocardial infarction   Discharge Diagnosis:  Acute ST elevation myocardial infarction Severe single vessel coronary artery disease History of tobacco use Postoperative tachycardia Expected postoperative blood loss anemia  Past Medical History  Diagnosis Date  . Psoriasis   . Essential hypertension      Procedures: EMERGENCY CORONARY ARTERY BYPASS GRAFTING x 2 (Left internal mammary artery to left anterior descending, saphenous vein graft to diagonal  ENDOSCOPIC GREATER SAPHENOUS VEIN HARVEST LEFT THIGH - 05/23/2015    HPI:  The patient is a 74 y.o. female who presented to the ER at [facility] on the date of admission complaining of chest pain which started with exertion about 1 hour previously.  She had noted increased fatigue earlier in the evening.  The pain radiated to the left arm and was  associated with diaphoresis and dyspnea. EKG showed ST elevation in leads I and AVL.  She was diagnosed with a STEMI and was taken to the cath lab for emergent cardiac catheterization.    Hospital Course:  Cardiac catheterization was performed by Dr. Katrinka Blazing, which showed severe ostial and mid LAD disease.  There was a complex thrombus at the ostium. Dr. Katrinka Blazing did not feel this could be stented safely. Her pain eased off but did not totally resolve. Cardiac surgery was consulted for consideration of CABG.  Dr. Dorris Fetch saw the patient and felt that the thrombus in the LAD could potentially compromise the left main and/or circumflex if angioplasty was attempted, therefore, emergency CABG was recommended. All risks, benefits and alternatives of surgery were explained in detail, and the patient agreed to proceed.    The patient was taken to the operating room and underwent the above procedure.    The postoperative course has generally been uneventful.  The patient was hemodynamically stable and able to transfer from the ICU to stepdown on postop day 1. She was started on a beta blocker, and this has been titrated upward for tachycardia. She has had a small left pleural effusion, but this has been treated conservatively and has not required thoracentesis. She is overall progressing well.  Incisions are healing well. She is ambulating in the halls without difficulty and has been weaned from supplemental oxygen. She is tolerating a regular diet. The patient is presently medically stable for discharge on today's date.     Recent vital signs:  Filed Vitals:   05/27/15 0456  BP: 112/73  Pulse: 112  Temp: 99.6 F (37.6 C)  Resp: 18    Recent laboratory studies:  CBC: Recent Labs  05/25/15 0600  WBC 12.0*  HGB 10.7*  HCT 33.0*  PLT 225   BMET:  Recent Labs  05/25/15 0600 05/27/15 0327  NA 138 142  K 3.9 2.9*  CL 103 101  CO2 26 30  GLUCOSE 110* 99  BUN 12 12  CREATININE 0.57 0.71  CALCIUM 8.1* 8.2*    PT/INR: No results for input(s): LABPROT, INR in the last 72 hours.   Discharge Medications:     Medication List    TAKE these medications  aspirin 325 MG EC tablet  Take 1 tablet (325 mg total) by mouth daily.     atorvastatin 40 MG tablet  Commonly known as:  LIPITOR  Take 1 tablet (40 mg total) by mouth daily at 6 PM.     calcipotriene 0.005 % cream  Commonly known as:  DOVONOX  Apply 1 application topically 2 (two) times daily.     clobetasol cream 0.05 %  Commonly known as:  TEMOVATE  Apply 1 application topically 2 (two) times daily.     furosemide 40 MG tablet  Commonly known as:  LASIX  Take 1 tablet (40 mg total) by mouth daily.     lisinopril 10 MG tablet  Commonly known as:  PRINIVIL,ZESTRIL  Take 1 tablet (10 mg total) by mouth daily.      metoprolol 50 MG tablet  Commonly known as:  LOPRESSOR  Take 1 tablet (50 mg total) by mouth 2 (two) times daily.     oxyCODONE 5 MG immediate release tablet  Commonly known as:  Oxy IR/ROXICODONE  Take 1-2 tablets (5-10 mg total) by mouth every 4 (four) hours as needed for severe pain.     potassium chloride SA 20 MEQ tablet  Commonly known as:  K-DUR,KLOR-CON  Take 2 tablets (40 mEq total) by mouth daily.         Discharge Instructions:  The patient is to refrain from driving, heavy lifting or strenuous activity.  May shower daily and clean incisions with soap and water.  May resume regular diet.    Follow Up:      Discharge Instructions    Amb Referral to Cardiac Rehabilitation    Complete by:  As directed   Congestive Heart Failure: If diagnosis is Heart Failure, patient MUST meet each of the CMS criteria: 1. Left Ventricular Ejection Fraction </= 35% 2. NYHA class II-IV symptoms despite being on optimal heart failure therapy for at least 6 weeks. 3. Stable = have not had a recent (<6 weeks) or planned (<6 months) major cardiovascular hospitalization or procedure  Program Details: - Physician supervised classes - 1-3 classes per week over a 12-18 week period, generally for a total of 36 sessions  Physician Certification: I certify that the above Cardiac Rehabilitation treatment is medically necessary and is medically approved by me for treatment of this patient. The patient is willing and cooperative, able to ambulate and medically stable to participate in exercise rehabilitation. The participant's progress and Individualized Treatment Plan will be reviewed by the Medical Director, Cardiac Rehab staff and as indicated by the Referring/Ordering Physician.  Diagnosis:  CABG          Follow-up Information    Follow up with [name], MD On 06/22/2015.   Specialty:  Cardiothoracic Surgery   Why:  Have a chest x-ray at [facility] at 2:30, then see MD at  3:30   Contact information:   [address] [place] [address] [phone]       Follow up with Tereso Newcomer, PA-C On 06/08/2015.   Specialties:  Physician Assistant, Radiology, Interventional Cardiology   Why:  Tereso Newcomer, PA-C 7/12 @8am  ([address] [facility])    Contact information:   [address] [place] [address] [phone]        The patient has been discharged on:  1.Beta Blocker: Yes [ x ]  No [ ]   If No, reason:    2.Ace Inhibitor/ARB: Yes [ x ]  No [  ]  If No, reason:    3.Statin: Yes [x  ]  No [ ]   If No, reason:    4.Ecasa: Yes [x  ]  No [ ]   If No, reason:    COLLINS,GINA H 05/27/2015, 9:27 AM

## 2015-05-27 NOTE — Progress Notes (Signed)
PA Collins notified of pt's potassium level 2.9 this AM.  Dana Palmer,Johncharles Fusselman B, RN

## 2015-05-28 LAB — BASIC METABOLIC PANEL
ANION GAP: 10 (ref 5–15)
BUN: 18 mg/dL (ref 6–20)
CALCIUM: 8.2 mg/dL — AB (ref 8.9–10.3)
CO2: 28 mmol/L (ref 22–32)
CREATININE: 0.66 mg/dL (ref 0.44–1.00)
Chloride: 104 mmol/L (ref 101–111)
GFR calc Af Amer: >60 mL/min (ref >60)
GFR calc non Af Amer: >60 mL/min (ref >60)
Glucose, Bld: 104 mg/dL — ABNORMAL HIGH (ref 65–99)
Potassium: 3.1 mmol/L — ABNORMAL LOW (ref 3.5–5.1)
Sodium: 142 mmol/L (ref 135–145)

## 2015-05-28 LAB — GLUCOSE, CAPILLARY
GLUCOSE-CAPILLARY: 120 mg/dL — AB (ref 65–99)
GLUCOSE-CAPILLARY: 126 mg/dL — AB (ref 65–99)
Glucose-Capillary: 99 mg/dL (ref 65–99)

## 2015-05-28 MED ORDER — ASPIRIN 325 MG PO TBEC: 325.0000 mg | DELAYED_RELEASE_TABLET | Freq: Every day | ORAL | Status: DC

## 2015-05-28 MED ORDER — LISINOPRIL 10 MG PO TABS: 10.0000 mg | ORAL_TABLET | Freq: Every day | ORAL | Status: DC

## 2015-05-28 MED ORDER — POTASSIUM CHLORIDE CRYS ER 20 MEQ PO TBCR: 40.0000 meq | EXTENDED_RELEASE_TABLET | Freq: Every day | ORAL | Status: DC

## 2015-05-28 MED ORDER — FUROSEMIDE 40 MG PO TABS: 40.0000 mg | ORAL_TABLET | Freq: Every day | ORAL | Status: DC

## 2015-05-28 MED ORDER — OXYCODONE HCL 5 MG PO TABS: 5.0000 mg | ORAL_TABLET | ORAL | Status: DC | PRN

## 2015-05-28 MED ORDER — METOPROLOL TARTRATE 50 MG PO TABS: 50.0000 mg | ORAL_TABLET | Freq: Two times a day (BID) | ORAL | Status: DC

## 2015-05-28 MED ORDER — ATORVASTATIN CALCIUM 40 MG PO TABS: 40.0000 mg | ORAL_TABLET | Freq: Every day | ORAL | Status: DC

## 2015-05-28 NOTE — Progress Notes (Addendum)
[address]       [place] [id number]             [phone]      6 Days Post-Op Procedure(s) (LRB): CORONARY ARTERY BYPASS GRAFTING (CABG) (N/A) TRANSESOPHAGEAL ECHOCARDIOGRAM (TEE) Subjective: Feels better, HR is improved to low 100's  Objective: Vital signs in last 24 hours: Temp:  [97.8 F (36.6 C)-99.4 F (37.4 C)] 98.5 F (36.9 C) (07/01 0430) Pulse Rate:  [104-116] 104 (07/01 0430) Cardiac Rhythm:  [-] Normal sinus rhythm;Sinus tachycardia (06/30 2300) Resp:  [17-18] 17 (07/01 0430) BP: (133-155)/(75-86) 155/86 mmHg (07/01 0430) SpO2:  [95 %] 95 % (07/01 0430) Weight:  [138 lb 6.4 oz (62.778 kg)] 138 lb 6.4 oz (62.778 kg) (07/01 0430)  Hemodynamic parameters for last 24 hours:    Intake/Output from previous day: 06/30 0701 - 07/01 0700 In: 480 [P.O.:480] Out: -  Intake/Output this shift:    General appearance: alert, cooperative and no distress Heart: regular rate and rhythm and tachy  Lungs: clear to auscultation bilaterally Abdomen: benign Extremities: minor edema Wound: healing well  Lab Results: No results for input(s): WBC, HGB, HCT, PLT in the last 72 hours. BMET:  Recent Labs  05/27/15 0327 05/28/15 0245  NA 142 142  K 2.9* 3.1*  CL 101 104  CO2 30 28  GLUCOSE 99 104*  BUN 12 18  CREATININE 0.71 0.66  CALCIUM 8.2* 8.2*    PT/INR: No results for input(s): LABPROT, INR in the last 72 hours. ABG    Component Value Date/Time   PHART 7.372 05/23/2015 1008   HCO3 23.0 05/23/2015 1008   TCO2 22 05/23/2015 1111   ACIDBASEDEF 2.0 05/23/2015 1008   O2SAT 93.0 05/23/2015 1008   CBG (last 3)   Recent Labs  05/27/15 1628 05/27/15 2241 05/28/15 0616  GLUCAP 98 120* 99    Meds Scheduled Meds: . acetaminophen  1,000 mg Oral 4 times per day   Or  . acetaminophen (TYLENOL) oral liquid 160 mg/5 mL  1,000 mg Per Tube 4 times per day  . aspirin EC  325 mg Oral Daily   Or  . aspirin  324 mg Per Tube Daily  .  atorvastatin  40 mg Oral q1800  . calcipotriene  1 application Topical BID  . clobetasol cream  1 application Topical BID  . enoxaparin (LOVENOX) injection  40 mg Subcutaneous QHS  . furosemide  40 mg Oral Daily  . insulin aspart  0-15 Units Subcutaneous TID WC  . lisinopril  5 mg Oral Daily  . metoprolol tartrate  50 mg Oral BID  . pantoprazole  40 mg Oral Daily  . potassium chloride  40 mEq Oral BID  . sodium chloride  3 mL Intravenous Q12H   Continuous Infusions:  PRN Meds:.sodium chloride, ALPRAZolam, alum & mag hydroxide-simeth, diphenhydrAMINE, ondansetron (ZOFRAN) IV, oxyCODONE, sodium chloride, traMADol  Xrays Dg Chest 2 View  05/27/2015   CLINICAL DATA:  Pleural effusion, status post CABG  EXAM: CHEST  2 VIEW  COMPARISON:  05/25/2015  FINDINGS: Cardiomediastinal silhouette is stable. Status post CABG. Persistent small left pleural effusion with left basilar atelectasis. Calcified granuloma left midlung again noted. Right lung is clear. Mild degenerative changes mid thoracic spine.  IMPRESSION: Status post CABG. Persistent small left pleural effusion with left basilar atelectasis.   Electronically Signed   By: Natasha MeadLiviu  Pop M.D.   On: 05/27/2015 08:04    Assessment/Plan: S/P Procedure(s) (LRB): CORONARY ARTERY  BYPASS GRAFTING (CABG) (N/A) TRANSESOPHAGEAL ECHOCARDIOGRAM (TEE)  1 doing well, will d/c epw's this am. Replace K+ 2 TSH is slightly elevated- have asked her to follow up with her primary MD which she agrees too. 3 d/c later this afternoon  LOS: 5 days    GOLD,WAYNE E 05/28/2015  patient examined and medical record reviewed,agree with above note. Kathlee Nations Trigt III 05/28/2015

## 2015-05-28 NOTE — Care Management (Signed)
Per Pt; her PCP is Dr. Josefina Doamille Adams [facility] on [address] [place], [place]

## 2015-05-28 NOTE — Progress Notes (Signed)
[id number] Education completed with pt who voiced understanding. Encouraged IS. Referring to CRP 2 with pt's permission to [place]. Wrote down how to view post op video later when pacing wires pulled. Luetta NuttingCharlene Kiasha Bellin RN BSN 05/28/2015 9:42 AM

## 2015-05-28 NOTE — Care Management Note (Addendum)
Case Management Note  Patient Details  Name: Dana Palmer MRN: [id number] Date of Birth: 1941/09/24  Subjective/Objective:      Pt admitted with STEMI      Action/Plan:  Pt is from home with live in daughter and grandson.  Pt states she already has shower chair and walker in the home.  CM will continue to monitor for disposition plan   Expected Discharge Date:                  Expected Discharge Plan:  Home w Home Health Services  In-House Referral:     Discharge planning Services     Post Acute Care Choice:    Choice offered to:     DME Arranged:    DME Agency:     HH Arranged:   HHRN HH Agency:   [facility]  Status of Service:  Complete, will sign off  Medicare Important Message Given: Yes  Date Medicare IM Given:  05/28/15 Medicare IM give by:  Raynald BlendSamantha Audwin Semper Date Additional Medicare IM Given:    Additional Medicare Important Message give by:     If discussed at Long Length of Stay Meetings, dates discussed:  05/27/15   Additional Comments: 05/28/15 Raynald BlendSamantha Nathalya Wolanski, RN, BSN [phone] CM requested bedside nurse to [facility] and face to face order from MD prior to pt discharge.  CM assessed pt prior to discharge, pt stated she thought she needed an aide to fill in as "simply a body" when family could not provide 24 hour supervision , pt has 24 supervision determined post discharge until possibly mid next week.  CM informed pt that nurse's aide via insurance would be warranted for help with ADL's, pt agreed she can perform her own ADL's.  CM provided pt with the option of a private pay nurses aide, pt requested resource list.  CM provided pt with list, pt stated that she felt this would better suit her need post discharge.  Pt offered [facility], pt chose [facility], agency contacted, referral accepted, agency informed of discharge today.  Address and phone numbers verified against epic. No other CM needs     05/27/15 Raynald BlendSamantha Avis Mcmahill, RN, BSN  [phone] CM assessed pt; pt states that she will have 24 hour supervision upon discharge, care will be provided by family.  Pt asked CM for [facility] for disease management, CM placed sticky note in Epic for MD.  CM will continue to monitor for disposition needs Cherylann ParrClaxton, Kyandra Mcclaine S, RN 05/28/2015, 10:55 AM

## 2015-06-03 ENCOUNTER — Telehealth: Payer: Self-pay | Admitting: Physician Assistant

## 2015-06-03 NOTE — Telephone Encounter (Signed)
Pt states that this morning she started having palpitations and some SOB with it  Lasting  about 3 hours. Pt  Took her AM medications including Metoprolol 50 mg. Symptoms have resolved, no C/O at this time. Pt is aware of appointment with Tereso NewcomerScott Weaver PA on Tuesday 06/08/15.

## 2015-06-03 NOTE — Telephone Encounter (Signed)
New message  Patient c/o Palpitations:  High priority if patient c/o lightheadedness and shortness of breath.  1. How long have you been having palpitations? As of this morning around 5am   2. Are you currently experiencing lightheadedness and shortness of breath? No. Some SOB when she walks a short distance but not while she is currently sitting on the edge of the bed  3. Have you checked your BP and heart rate? (document readings) No  4. Are you experiencing any other symptoms? No  Pt states that she has not yet had her morning meds.

## 2015-06-05 DIAGNOSIS — Z48812 Encounter for surgical aftercare following surgery on the circulatory system: Secondary | ICD-10-CM | POA: Diagnosis not present

## 2015-06-07 DIAGNOSIS — E785 Hyperlipidemia, unspecified: Secondary | ICD-10-CM | POA: Insufficient documentation

## 2015-06-07 DIAGNOSIS — I255 Ischemic cardiomyopathy: Secondary | ICD-10-CM | POA: Insufficient documentation

## 2015-06-07 DIAGNOSIS — I10 Essential (primary) hypertension: Secondary | ICD-10-CM | POA: Insufficient documentation

## 2015-06-07 DIAGNOSIS — I25810 Atherosclerosis of coronary artery bypass graft(s) without angina pectoris: Secondary | ICD-10-CM | POA: Insufficient documentation

## 2015-06-07 HISTORY — DX: Hyperlipidemia, unspecified: E78.5

## 2015-06-07 NOTE — Progress Notes (Signed)
Cardiology Office Note   Date:  06/08/2015   ID:  Dana Palmer, DOB Nov 12, 1941, MRN [id number]  PCP:  Willow Ora, MD  Cardiologist:  Dr. Verdis Prime     Chief Complaint  Patient presents with  . Hospitalization Follow-up    s/p STEMI >> CABG     History of Present Illness: Dana Palmer is a 74 y.o. female with a hx of hypertension, psoriasis, former tobacco abuse.  Admitted 6/25-7/1 with an antero-lateral STEMI. Emergent cardiac catheterization demonstrated severe ostial-mid LAD disease with complex thrombus at the ostium and moderate nonobstructive disease in the RCA with a patent circumflex. EF was 35%. She was taken for emergent CABG with a LIMA-LAD and SVG-diagonal by Dr. Dorris Fetch. Postoperative course was fairly uneventful. She remained in sinus rhythm. Of note, echocardiogram 6/26 demonstrated EF 35% with periapical HK.  She returns for FU.  She is here today with her daughter. She has been doing well since DC from the hospital. She is walking when she can. She denies any significant dyspnea. She denies orthopnea, PND or edema. Weight has decreased since DC. Chest soreness is improving. She denies fevers or chills. Appetite is improving. She does have a nonproductive cough. She did have some irregular palpitations when she first came home from the hospital that improved with the metoprolol. These have since stopped.   Studies/Reports Reviewed Today:  Echo 05/23/15 Mild LVH, EF 35%, periapical HK, grade 2 diastolic dysfunction MAC  LHC 03/05/80 LAD: Ostial to mid 99%, mid to distal 99%, D2 80% RCA: Proximal 50%, mid 50% EF 35% with anteroapical akinesis   Past Medical History  Diagnosis Date  . Psoriasis   . Essential hypertension   . CAD (coronary artery disease)     a. ant-lat STEMI 6/16 >> LHC:  ostial to mid LAD 99% with complex thrombus, mid to dist LAD 99%, D2 80%, prox RCA 50%, mid RCA 50%, EF 35% with ant-apical AK >>  b. s/p CABG (L-LAD, S-Dx)  . Ischemic  cardiomyopathy     a. echo 6/16:  EF 35%, mild LVH, periapical HK, Gr 2 DD  . HLD (hyperlipidemia)     Past Surgical History  Procedure Laterality Date  . Tubal ligation    . Carpal tunnel release    . Tonsillectomy    . Coronary artery bypass graft    . Cardiac catheterization N/A 05/22/2015    Procedure: Left Heart Cath and Coronary Angiography;  Surgeon: Lyn Records, MD;  Location: [facility] INVASIVE CV LAB;  Service: Cardiovascular;  Laterality: N/A;  . Coronary artery bypass graft N/A 05/22/2015    Procedure: CORONARY ARTERY BYPASS GRAFTING (CABG);  Surgeon: [name], MD;  Location[facility] OR;  Service: Open Heart Surgery;  Laterality: N/A;  . Tee without cardioversion  05/22/2015    Procedure: TRANSESOPHAGEAL ECHOCARDIOGRAM (TEE);  Surgeon: [name], MD;  Location: [facility] OR;  Service: Open Heart Surgery;;     Current Outpatient Prescriptions  Medication Sig Dispense Refill  . aspirin EC 325 MG EC tablet Take 1 tablet (325 mg total) by mouth daily.    Marland Kitchen atorvastatin (LIPITOR) 40 MG tablet Take 1 tablet (40 mg total) by mouth daily at 6 PM. 30 tablet 1  . calcipotriene (DOVONOX) 0.005 % cream Apply 1 application topically 2 (two) times daily.    . clobetasol cream (TEMOVATE) 0.05 % Apply 1 application topically 2 (two) times daily.    . metoprolol (LOPRESSOR) 50 MG tablet Take 1.5 tablets (75  mg total) by mouth 2 (two) times daily. 90 tablet 11  . oxyCODONE (OXY IR/ROXICODONE) 5 MG immediate release tablet Take 1-2 tablets (5-10 mg total) by mouth every 4 (four) hours as needed for severe pain. 50 tablet 0  . losartan (COZAAR) 25 MG tablet Take 1 tablet (25 mg total) by mouth daily. 30 tablet 11   No current facility-administered medications for this visit.    Allergies:   Codeine; Methotrexate derivatives; Sulfa antibiotics; Avelox; Moxifloxacin hcl in nacl; and Lisinopril    Social History:  The patient  reports that she quit smoking about 14 years ago. She has  never used smokeless tobacco. She reports that she does not drink alcohol or use illicit drugs.   Family History:  The patient's family history includes Heart attack in an other family member; Heart disease in her father; Heart failure in her mother; Pancreatic disease in her mother; Urolithiasis in her father.    ROS:   Please see the history of present illness.   Review of Systems  Cardiovascular: Positive for irregular heartbeat.  Musculoskeletal: Positive for joint pain.  All other systems reviewed and are negative.    PHYSICAL EXAM: VS:  BP 130/72 mmHg  Pulse 95  Ht 5\' 2"  (1.575 m)  Wt 134 lb 9.6 oz (61.054 kg)  BMI 24.61 kg/m2    Wt Readings from Last 3 Encounters:  06/08/15 134 lb 9.6 oz (61.054 kg)  05/28/15 138 lb 6.4 oz (62.778 kg)     GEN: Well nourished, well developed, in no acute distress HEENT: normal Neck: no JVD,  no masses Cardiac:  Normal S1/S2, RRR; no murmur ,  no rubs or gallops, no edema   Chest:  Median sternotomy wound well healed without erythema or discharge Respiratory:  clear to auscultation bilaterally, no wheezing, rhonchi or rales. GI: soft, nontender, nondistended, + BS MS: no deformity or atrophy Skin: warm and dry  Neuro:  CNs II-XII intact, Strength and sensation are intact Psych: Normal affect   EKG:  EKG is ordered today.  It demonstrates:   NSR, HR 95, normal axis, T-wave inversions V3-V6   Recent Labs: 05/23/2015: Magnesium 2.8* 05/25/2015: Hemoglobin 10.7*; Platelets 225 05/27/2015: TSH 5.500* 05/28/2015: BUN 18; Creatinine, Ser 0.66; Potassium 3.1*; Sodium 142    Lipid Panel No results found for: CHOL, TRIG, HDL, CHOLHDL, VLDL, LDLCALC, LDLDIRECT    ASSESSMENT AND PLAN:  1.  Coronary artery disease S/P CABG x 2:  She is progressing well after recent emergent CABG for anterolateral STEMI.  Continue aspirin, statin, beta blocker. She does have a cough from ACE inhibitor. This will be changed to an ARB. Refer to cardiac  rehabilitation. Follow-up with Dr. Dorris FetchHendrickson as planned. 2.  Ischemic cardiomyopathy:  Continue beta blocker. Heart rate is elevated. I will increase metoprolol to 75 mg twice a day. She has a cough from lisinopril. DC lisinopril. Start losartan 25 mg daily. Check BMET one week. Obtain limited echo to recheck ejection fraction. If EF <35%, she will need to be set up for a life vest. She does not appear to be volume overloaded. We discussed the importance of daily weights. If she develops significant weight gain or swelling, she will contact us. At that point, we would need to resume Lasix. 3.  Essential hypertension:  Controlled. 4.  Hyperlipidemia:  Continue statin. Check lipids and LFTs in 6-8 weeks. 5.  Palpitations:  She is certainly at risk for atrial fibrillation. These symptoms have resolved. I advised her to  contact us if they should recur. Adjust beta blocker as noted. Obtain limited echo as noted.    Medication Changes: Current medicines are reviewed at length with the patient today.  Concerns regarding medicines are as outlined above.  The following changes have been made:   Discontinued Medications   FUROSEMIDE (LASIX) 40 MG TABLET    Take 1 tablet (40 mg total) by mouth daily.   LISINOPRIL (PRINIVIL,ZESTRIL) 10 MG TABLET    Take 1 tablet (10 mg total) by mouth daily.   POTASSIUM CHLORIDE SA (K-DUR,KLOR-CON) 20 MEQ TABLET    Take 2 tablets (40 mEq total) by mouth daily.   Modified Medications   Modified Medication Previous Medication   METOPROLOL (LOPRESSOR) 50 MG TABLET metoprolol (LOPRESSOR) 50 MG tablet      Take 1.5 tablets (75 mg total) by mouth 2 (two) times daily.    Take 1 tablet (50 mg total) by mouth 2 (two) times daily.   New Prescriptions   LOSARTAN (COZAAR) 25 MG TABLET    Take 1 tablet (25 mg total) by mouth daily.    Labs/ tests ordered today include:   Orders Placed This Encounter  Procedures  . Lipid Profile  . Hepatic function panel  . Basic Metabolic  Panel (BMET)  . EKG 12-Lead  . Echocardiogram     Disposition:   FU with Dr. Verdis Prime 8 weeks.    Signed, Brynda Rim, MHS 06/08/2015 9:06 AM    [facility] [address] [place], [place], [place]  [address] Phone: [phone]; Fax: [phone]

## 2015-06-08 ENCOUNTER — Encounter: Payer: Self-pay | Admitting: Physician Assistant

## 2015-06-08 ENCOUNTER — Ambulatory Visit (INDEPENDENT_AMBULATORY_CARE_PROVIDER_SITE_OTHER): Payer: Medicare Other | Admitting: Physician Assistant

## 2015-06-08 ENCOUNTER — Other Ambulatory Visit: Payer: Self-pay

## 2015-06-08 ENCOUNTER — Ambulatory Visit (HOSPITAL_COMMUNITY): Payer: Medicare Other | Attending: Physician Assistant

## 2015-06-08 VITALS — BP 130/72 | HR 95 | Ht 62.0 in | Wt 134.6 lb

## 2015-06-08 DIAGNOSIS — I10 Essential (primary) hypertension: Secondary | ICD-10-CM

## 2015-06-08 DIAGNOSIS — I2510 Atherosclerotic heart disease of native coronary artery without angina pectoris: Secondary | ICD-10-CM

## 2015-06-08 DIAGNOSIS — Z951 Presence of aortocoronary bypass graft: Secondary | ICD-10-CM | POA: Diagnosis not present

## 2015-06-08 DIAGNOSIS — I351 Nonrheumatic aortic (valve) insufficiency: Secondary | ICD-10-CM | POA: Insufficient documentation

## 2015-06-08 DIAGNOSIS — I059 Rheumatic mitral valve disease, unspecified: Secondary | ICD-10-CM | POA: Diagnosis not present

## 2015-06-08 DIAGNOSIS — I255 Ischemic cardiomyopathy: Secondary | ICD-10-CM | POA: Diagnosis not present

## 2015-06-08 DIAGNOSIS — I313 Pericardial effusion (noninflammatory): Secondary | ICD-10-CM | POA: Insufficient documentation

## 2015-06-08 DIAGNOSIS — E785 Hyperlipidemia, unspecified: Secondary | ICD-10-CM

## 2015-06-08 MED ORDER — METOPROLOL TARTRATE 50 MG PO TABS: 75.0000 mg | ORAL_TABLET | Freq: Two times a day (BID) | ORAL | Status: DC

## 2015-06-08 MED ORDER — LOSARTAN POTASSIUM 25 MG PO TABS: 25.0000 mg | ORAL_TABLET | Freq: Every day | ORAL | Status: DC

## 2015-06-08 NOTE — Patient Instructions (Signed)
Medication Instructions:  1. STOP LISINOPRIL 2. STOP LASIX  3. STOP POTASSIUM 4. START LOSARTAN 25 MG 1 TAB DAILY; RX SENT 5. INCREASE METOPROLOL 75 MG TWICE DAILY (THIS WILL BE 1 AND 1/2 TABS TWICE DAILY)  Labwork: 1. BMET IN 1 WEEK 2. 6-8 WEEKS FOR FASTING LIPID AND LIVER PANEL  Testing/Procedures: Your physician has requested that you have an LIMITED echocardiogram THIS WEEK PER SCOTT WEAVER, PAC; S/P CABG REASSESS EF. Echocardiography is a painless test that uses sound waves to create images of your heart. It provides your doctor with information about the size and shape of your heart and how well your heart's chambers and valves are working. This procedure takes approximately one hour. There are no restrictions for this procedure.   Follow-Up: DR. Katrinka BlazingSMITH IN 8 WEEKS  Any Other Special Instructions Will Be Listed Below (If Applicable). 1. MONITOR WEIGHT DAILY; CALL IF WEIGHT IS UP 3 LB OR MORE IN 1 DAY OR 5 LB IN 1 WEEK OR MORE SWELLING OR SHORTNESS OF BREATH  2. CARDIAC REHAB

## 2015-06-09 ENCOUNTER — Telehealth: Payer: Self-pay | Admitting: *Deleted

## 2015-06-09 NOTE — Telephone Encounter (Signed)
Pt notified about echo results with verbal understanding EF now normal. Pt said thank you for the great news.

## 2015-06-16 ENCOUNTER — Other Ambulatory Visit: Payer: Self-pay | Admitting: Thoracic Surgery (Cardiothoracic Vascular Surgery)

## 2015-06-17 ENCOUNTER — Other Ambulatory Visit: Payer: Self-pay | Admitting: Thoracic Surgery (Cardiothoracic Vascular Surgery)

## 2015-06-17 DIAGNOSIS — Z951 Presence of aortocoronary bypass graft: Secondary | ICD-10-CM

## 2015-06-18 ENCOUNTER — Telehealth: Payer: Self-pay | Admitting: *Deleted

## 2015-06-18 ENCOUNTER — Other Ambulatory Visit (INDEPENDENT_AMBULATORY_CARE_PROVIDER_SITE_OTHER): Payer: Medicare Other | Admitting: *Deleted

## 2015-06-18 DIAGNOSIS — I255 Ischemic cardiomyopathy: Secondary | ICD-10-CM

## 2015-06-18 DIAGNOSIS — I10 Essential (primary) hypertension: Secondary | ICD-10-CM

## 2015-06-18 DIAGNOSIS — I2510 Atherosclerotic heart disease of native coronary artery without angina pectoris: Secondary | ICD-10-CM | POA: Diagnosis not present

## 2015-06-18 DIAGNOSIS — Z951 Presence of aortocoronary bypass graft: Secondary | ICD-10-CM

## 2015-06-18 LAB — BASIC METABOLIC PANEL
BUN: 12 mg/dL (ref 6–23)
CALCIUM: 9.4 mg/dL (ref 8.4–10.5)
CHLORIDE: 104 meq/L (ref 96–112)
CO2: 29 mEq/L (ref 19–32)
Creatinine, Ser: 0.78 mg/dL (ref 0.40–1.20)
GFR: 76.89 mL/min (ref >60.00)
Glucose, Bld: 110 mg/dL — ABNORMAL HIGH (ref 70–99)
Potassium: 3.9 mEq/L (ref 3.5–5.1)
Sodium: 142 mEq/L (ref 135–145)

## 2015-06-18 NOTE — Telephone Encounter (Signed)
Pt notified of lab results by phone with verbal understanding.  

## 2015-06-22 ENCOUNTER — Encounter (INDEPENDENT_AMBULATORY_CARE_PROVIDER_SITE_OTHER): Payer: Self-pay

## 2015-06-22 ENCOUNTER — Ambulatory Visit (INDEPENDENT_AMBULATORY_CARE_PROVIDER_SITE_OTHER): Payer: Self-pay | Admitting: Thoracic Surgery (Cardiothoracic Vascular Surgery)

## 2015-06-22 ENCOUNTER — Ambulatory Visit
Admission: RE | Admit: 2015-06-22 | Discharge: 2015-06-22 | Disposition: A | Discharge status: Home / Self Care | Payer: Medicare Other | Source: Ambulatory Visit | Attending: Thoracic Surgery (Cardiothoracic Vascular Surgery) | Admitting: Thoracic Surgery (Cardiothoracic Vascular Surgery)

## 2015-06-22 ENCOUNTER — Encounter: Payer: Self-pay | Admitting: Thoracic Surgery (Cardiothoracic Vascular Surgery)

## 2015-06-22 VITALS — BP 144/88 | HR 90 | Resp 20 | Ht 62.0 in | Wt 134.0 lb

## 2015-06-22 DIAGNOSIS — I213 ST elevation (STEMI) myocardial infarction of unspecified site: Secondary | ICD-10-CM

## 2015-06-22 DIAGNOSIS — Z951 Presence of aortocoronary bypass graft: Secondary | ICD-10-CM

## 2015-06-22 NOTE — Progress Notes (Signed)
[address]       [place] [address]             [phone]      HPI:  Dana Palmer returns today for scheduled postoperative follow-up visit.  She is a 74 year old woman who presented with an ST elevation MI. She had severe LAD and diagonal disease and underwent emergency coronary bypass grafting 2 on 05/23/2015. Her postoperative course was unremarkable and she was discharged home on 05/28/2015.  She feels well. She has not had any recurrent angina. Her incisional pain is minimal and mostly bothers her at night when she turns over in bed. She has not taken any narcotics since discharge from the hospital. She denies shortness of breath. Her exercise tolerance is improving. Her energy levels are improving as well.  Past Medical History  Diagnosis Date  . Psoriasis   . Essential hypertension   . CAD (coronary artery disease)     a. ant-lat STEMI 6/16 >> LHC:  ostial to mid LAD 99% with complex thrombus, mid to dist LAD 99%, D2 80%, prox RCA 50%, mid RCA 50%, EF 35% with ant-apical AK >>  b. s/p CABG (L-LAD, S-Dx)  . Ischemic cardiomyopathy     a. echo 6/16:  EF 35%, mild LVH, periapical HK, Gr 2 DD;  b.  Echo 7/16:  Mild LVH, mild focal basal septal hypertrophy, EF 60-65%, no RWMA, trivial AI, MAC, mild increased PASP, trivial effusion  . HLD (hyperlipidemia)    Past Surgical History  Procedure Laterality Date  . Tubal ligation    . Carpal tunnel release    . Tonsillectomy    . Coronary artery bypass graft    . Cardiac catheterization N/A 05/22/2015    Procedure: Left Heart Cath and Coronary Angiography;  Surgeon: Lyn Records, MD;  Location: [facility] INVASIVE CV LAB;  Service: Cardiovascular;  Laterality: N/A;  . Coronary artery bypass graft N/A 05/22/2015    Procedure: CORONARY ARTERY BYPASS GRAFTING (CABG);  Surgeon: [name], MD;  Location: [facility] OR;  Service: Open Heart Surgery;  Laterality: N/A;  . Tee without cardioversion  05/22/2015    Procedure:  TRANSESOPHAGEAL ECHOCARDIOGRAM (TEE);  Surgeon: [name], MD;  Location: [facility] OR;  Service: Open Heart Surgery;;      Current Outpatient Prescriptions  Medication Sig Dispense Refill  . aspirin EC 325 MG EC tablet Take 1 tablet (325 mg total) by mouth daily.    Marland Kitchen atorvastatin (LIPITOR) 40 MG tablet Take 1 tablet (40 mg total) by mouth daily at 6 PM. 30 tablet 1  . calcipotriene (DOVONOX) 0.005 % cream Apply 1 application topically 2 (two) times daily.    . clobetasol cream (TEMOVATE) 0.05 % Apply 1 application topically 2 (two) times daily.    Marland Kitchen losartan (COZAAR) 25 MG tablet Take 1 tablet (25 mg total) by mouth daily. 30 tablet 11  . metoprolol (LOPRESSOR) 50 MG tablet Take 1.5 tablets (75 mg total) by mouth 2 (two) times daily. 90 tablet 11  . oxyCODONE (OXY IR/ROXICODONE) 5 MG immediate release tablet Take 1-2 tablets (5-10 mg total) by mouth every 4 (four) hours as needed for severe pain. (Patient not taking: Reported on 06/22/2015) 50 tablet 0   No current facility-administered medications for this visit.    Physical Exam BP 144/88 mmHg  Pulse 90  Resp 20  Ht 5\' 2"  (1.575 m)  Wt 134 lb (60.782 kg)  BMI 24.50 kg/m2  SpO2  49% 74 year old woman in no acute distress Alert and oriented 3 with no focal neurologic deficits Lungs slightly diminished at left base, otherwise clear Cardiac regular rate and rhythm normal S1 and S2 no rubs or gallops Sternum stable, incision healing well Leg incision healing well, no peripheral edema  Diagnostic Tests: CHEST 2 VIEW  COMPARISON: PA and lateral chest x-ray of May 27, 2015  FINDINGS: There is persistent elevation of the left hemidiaphragm. There is a small left pleural effusion. The right lung is well-expanded and clear. There is a stable calcified granuloma in the left mid lung. The cardiac silhouette is mildly enlarged. The central pulmonary vascularity is prominent. There is no cephalization of the vascular pattern.  There are 6 intact sternal wires. The retrosternal soft tissues are normal.  IMPRESSION: 1. Persistent elevation of the left hemidiaphragm. There is a small stable left pleural effusion. The right pleural effusion has cleared. 2. Stable mild cardiomegaly with central pulmonary vascular prominence without cephalization. There is no pulmonary edema.   Electronically Signed  By: David Swaziland M.D.  I personally reviewed the chest x-ray agree with the findings as noted above.  Impression: 74 year old woman who is now month out from emergency coronary bypass grafting x 2 for severe single-vessel disease in the setting of a ST elevation MI. She is doing extremely well at this point in time. She has minimal discomfort is not requiring any narcotics. She takes an occasional Tylenol. Her exercise tolerance is improving.  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 This does not seem to be bothering her at all. I reviewed the chest x-ray with the patient and her daughter. They understand that this will usually resolve over time. It often takes 6-9 months.  She may begin driving. Appropriate precautions were discussed.  She is not to lift anything over 10 pounds for another 2 weeks. After that she can begin to build up gradually.  Her blood pressure was elevated today. She is already on high doses of antihypertensives. She will follow-up with Dr. Katrinka Blazing and Dr. Mardelle Matte  Plan: I will see her back in 2 months with a repeat chest x-ray to check on the left hemidiaphragm.  [name], MD [facility] [phone]

## 2015-06-28 ENCOUNTER — Telehealth: Payer: Self-pay | Admitting: Interventional Cardiology

## 2015-06-28 NOTE — Telephone Encounter (Signed)
New message      Need to confirm CHF diagnosis and get most recent ejection fraction

## 2015-07-01 NOTE — Telephone Encounter (Signed)
Returned call to [facility] Lmom secured voicemail. Pt last EF by echo 60-65% CHF is not listed on pt problem list. Jesusita Oka should call back if further assistance is needed

## 2015-07-08 ENCOUNTER — Encounter (HOSPITAL_COMMUNITY)
Admission: RE | Admit: 2015-07-08 | Discharge: 2015-07-08 | Disposition: A | Discharge status: Home / Self Care | Payer: Medicare Other | Source: Ambulatory Visit | Attending: Interventional Cardiology | Admitting: Interventional Cardiology

## 2015-07-08 DIAGNOSIS — Z48812 Encounter for surgical aftercare following surgery on the circulatory system: Secondary | ICD-10-CM | POA: Insufficient documentation

## 2015-07-08 DIAGNOSIS — Z951 Presence of aortocoronary bypass graft: Secondary | ICD-10-CM | POA: Insufficient documentation

## 2015-07-08 NOTE — Progress Notes (Signed)
Cardiac Rehab Medication Review by a Pharmacist  Does the patient  feel that his/her medications are working for him/her?  yes  Has the patient been experiencing any side effects to the medications prescribed?  no  Does the patient measure his/her own blood pressure or blood glucose at home?  no   Does the patient have any problems obtaining medications due to transportation or finances?   no  Understanding of regimen: excellent Understanding of indications: excellent Potential of compliance: excellent    Pharmacist comments: Pt was a previous nurse and is very aware of why, how, and when to take meds. Pleasant and receptive to information.    Sherron Monday 07/08/2015 9:35 AM

## 2015-07-12 ENCOUNTER — Encounter (HOSPITAL_COMMUNITY)
Admission: RE | Admit: 2015-07-12 | Discharge: 2015-07-12 | Disposition: A | Discharge status: Home / Self Care | Payer: Medicare Other | Source: Ambulatory Visit | Attending: Interventional Cardiology | Admitting: Interventional Cardiology

## 2015-07-12 DIAGNOSIS — Z951 Presence of aortocoronary bypass graft: Secondary | ICD-10-CM | POA: Diagnosis not present

## 2015-07-12 DIAGNOSIS — Z48812 Encounter for surgical aftercare following surgery on the circulatory system: Secondary | ICD-10-CM | POA: Diagnosis present

## 2015-07-12 NOTE — Progress Notes (Signed)
Pt started cardiac rehab today.  Pt tolerated light exercise without difficulty. VSS, telemetry-Sinus Rhythm, asymptomatic.  Medication list reconciled.  Pt verbalized compliance with medications and denies barriers to compliance. PSYCHOSOCIAL ASSESSMENT:  PHQ-1. Pt exhibits positive coping skills, hopeful outlook with supportive family her daughter and grandson. Bridney said today is the 3 year anniversary of her two granddaughter's death. Emotional support provided to the patient . Patient reported feeling down about her grandchildren other wise the patient denies feeling depressed   Pt enjoys reading and gardening.   Pt cardiac rehab  goal is  to  Improve her shortness of breath and increase her exercise capacity.  Pt encouraged to participate in functional fitness and exercising on your own   to increase ability to achieve these goals.   Pt long term cardiac rehab goal is improve her actvity tolerance..  Pt oriented to exercise equipment and routine.  Understanding verbalized. Marili reported feeling a little short of breath on the airdyne this morning. Oxygen saturation 97% on room air. Patient instructed how to purse lip breath and given a pursed lipped breathing handout. Ms Storlie has an elevated left hemidiaphragm from her open heart surgery. No other complaints voiced.  Will continue to monitor the patient throughout  the program.

## 2015-07-14 ENCOUNTER — Encounter (HOSPITAL_COMMUNITY)
Admission: RE | Admit: 2015-07-14 | Discharge: 2015-07-14 | Disposition: A | Discharge status: Home / Self Care | Payer: Medicare Other | Source: Ambulatory Visit | Attending: Interventional Cardiology | Admitting: Interventional Cardiology

## 2015-07-14 DIAGNOSIS — Z48812 Encounter for surgical aftercare following surgery on the circulatory system: Secondary | ICD-10-CM | POA: Diagnosis not present

## 2015-07-16 ENCOUNTER — Encounter (HOSPITAL_COMMUNITY)
Admission: RE | Admit: 2015-07-16 | Discharge: 2015-07-16 | Disposition: A | Discharge status: Home / Self Care | Payer: Medicare Other | Source: Ambulatory Visit | Attending: Interventional Cardiology | Admitting: Interventional Cardiology

## 2015-07-16 DIAGNOSIS — Z48812 Encounter for surgical aftercare following surgery on the circulatory system: Secondary | ICD-10-CM | POA: Diagnosis not present

## 2015-07-17 ENCOUNTER — Other Ambulatory Visit: Payer: Self-pay | Admitting: Physician Assistant

## 2015-07-19 ENCOUNTER — Encounter (HOSPITAL_COMMUNITY)
Admission: RE | Admit: 2015-07-19 | Discharge: 2015-07-19 | Disposition: A | Discharge status: Home / Self Care | Payer: Medicare Other | Source: Ambulatory Visit | Attending: Interventional Cardiology | Admitting: Interventional Cardiology

## 2015-07-19 DIAGNOSIS — Z48812 Encounter for surgical aftercare following surgery on the circulatory system: Secondary | ICD-10-CM | POA: Diagnosis not present

## 2015-07-19 NOTE — Progress Notes (Signed)
Reviewed home exercise guidelines with patient including endpoints, temperature precautions, target heart rate and rate of perceived exertion. Pt plans to walk as her mode of home exercise. Pt voices understanding of instructions given. Tammie Ellsworth M Karmin Kasprzak, MS, ACSM CCEP  

## 2015-07-20 ENCOUNTER — Telehealth: Payer: Self-pay

## 2015-07-20 ENCOUNTER — Other Ambulatory Visit: Payer: Self-pay | Admitting: Nurse Practitioner

## 2015-07-20 ENCOUNTER — Other Ambulatory Visit (INDEPENDENT_AMBULATORY_CARE_PROVIDER_SITE_OTHER): Payer: Medicare Other | Admitting: *Deleted

## 2015-07-20 DIAGNOSIS — R945 Abnormal results of liver function studies: Principal | ICD-10-CM

## 2015-07-20 DIAGNOSIS — E785 Hyperlipidemia, unspecified: Secondary | ICD-10-CM | POA: Diagnosis not present

## 2015-07-20 DIAGNOSIS — R7989 Other specified abnormal findings of blood chemistry: Secondary | ICD-10-CM

## 2015-07-20 DIAGNOSIS — R110 Nausea: Secondary | ICD-10-CM

## 2015-07-20 LAB — LIPID PANEL
CHOL/HDL RATIO: 5
Cholesterol: 129 mg/dL (ref 0–200)
HDL: 27.7 mg/dL — ABNORMAL LOW (ref >39.00)
LDL Cholesterol: 88 mg/dL (ref 0–99)
NONHDL: 101.73
Triglycerides: 70 mg/dL (ref 0.0–149.0)
VLDL: 14 mg/dL (ref 0.0–40.0)

## 2015-07-20 LAB — HEPATIC FUNCTION PANEL
ALK PHOS: 892 U/L — AB (ref 39–117)
ALT: 186 U/L — ABNORMAL HIGH (ref 0–35)
AST: 158 U/L — AB (ref 0–37)
Albumin: 3.4 g/dL — ABNORMAL LOW (ref 3.5–5.2)
BILIRUBIN DIRECT: 1.4 mg/dL — AB (ref 0.0–0.3)
Total Bilirubin: 2.1 mg/dL — ABNORMAL HIGH (ref 0.2–1.2)
Total Protein: 7.1 g/dL (ref 6.0–8.3)

## 2015-07-20 NOTE — Telephone Encounter (Signed)
Called Patient about lab results. Per Norma Fredrickson NP, Please call - needs to stop her statin therapy. LFTs are quite high. Let's find out how she is feeling. Recheck HPF on Friday. Could not find a baseline LFT result. Patient stated that she was feeling nauseas this morning and still does not feel so great. Patient did not have any emesis and denied any pain in abdominal area. Patient will come in Friday for repeat labs. Lawson Fiscal has also requested an abdominal ultra sound. Patient is aware and will call for an appointment with [facility]. Informed patient to stop her Tylenol also. Patient verbalized understanding and will stop her Lipitor.

## 2015-07-20 NOTE — Progress Notes (Signed)
Lab reviewed for Dana Newcomer, PA - LFTs are markedly abnormal. Triage has contacted to the patient to stop her statin and Tylenol. She notes that she is nauseated.  Will check abdominal ultrasound as well.  Recheck LFTs later this week.   Rosalio Macadamia, RN, ANP-C [facility] [address] [place], [place]  [address] [phone]

## 2015-07-21 ENCOUNTER — Other Ambulatory Visit: Payer: Self-pay

## 2015-07-21 ENCOUNTER — Ambulatory Visit
Admission: RE | Admit: 2015-07-21 | Discharge: 2015-07-21 | Disposition: A | Discharge status: Home / Self Care | Payer: Medicare Other | Source: Ambulatory Visit | Attending: Nurse Practitioner | Admitting: Nurse Practitioner

## 2015-07-21 ENCOUNTER — Encounter (HOSPITAL_COMMUNITY): Payer: Medicare Other

## 2015-07-21 DIAGNOSIS — R7989 Other specified abnormal findings of blood chemistry: Secondary | ICD-10-CM

## 2015-07-21 DIAGNOSIS — R945 Abnormal results of liver function studies: Principal | ICD-10-CM

## 2015-07-21 DIAGNOSIS — R935 Abnormal findings on diagnostic imaging of other abdominal regions, including retroperitoneum: Secondary | ICD-10-CM

## 2015-07-21 DIAGNOSIS — R110 Nausea: Secondary | ICD-10-CM

## 2015-07-23 ENCOUNTER — Other Ambulatory Visit (INDEPENDENT_AMBULATORY_CARE_PROVIDER_SITE_OTHER): Payer: Medicare Other | Admitting: *Deleted

## 2015-07-23 ENCOUNTER — Telehealth: Payer: Self-pay | Admitting: *Deleted

## 2015-07-23 ENCOUNTER — Encounter (HOSPITAL_COMMUNITY): Payer: Medicare Other

## 2015-07-23 ENCOUNTER — Ambulatory Visit (INDEPENDENT_AMBULATORY_CARE_PROVIDER_SITE_OTHER)
Admission: RE | Admit: 2015-07-23 | Discharge: 2015-07-23 | Disposition: A | Discharge status: Home / Self Care | Payer: Medicare Other | Source: Ambulatory Visit | Attending: Nurse Practitioner | Admitting: Nurse Practitioner

## 2015-07-23 DIAGNOSIS — Z48812 Encounter for surgical aftercare following surgery on the circulatory system: Secondary | ICD-10-CM | POA: Diagnosis not present

## 2015-07-23 DIAGNOSIS — R7989 Other specified abnormal findings of blood chemistry: Secondary | ICD-10-CM

## 2015-07-23 DIAGNOSIS — R935 Abnormal findings on diagnostic imaging of other abdominal regions, including retroperitoneum: Secondary | ICD-10-CM | POA: Diagnosis not present

## 2015-07-23 DIAGNOSIS — R945 Abnormal results of liver function studies: Principal | ICD-10-CM

## 2015-07-23 LAB — HEPATIC FUNCTION PANEL
ALT: 180 U/L — ABNORMAL HIGH (ref 0–35)
AST: 133 U/L — ABNORMAL HIGH (ref 0–37)
Albumin: 3.1 g/dL — ABNORMAL LOW (ref 3.5–5.2)
Alkaline Phosphatase: 914 U/L — ABNORMAL HIGH (ref 39–117)
Bilirubin, Direct: 1.2 mg/dL — ABNORMAL HIGH (ref 0.0–0.3)
Total Bilirubin: 2 mg/dL — ABNORMAL HIGH (ref 0.2–1.2)
Total Protein: 6.9 g/dL (ref 6.0–8.3)

## 2015-07-23 MED ORDER — IOHEXOL 300 MG/ML  SOLN
100.0000 mL | Freq: Once | INTRAMUSCULAR | Status: AC | PRN
  Administered 2015-07-23: 100 mL via INTRAVENOUS

## 2015-07-23 NOTE — Telephone Encounter (Signed)
Pt notified of CT results and has been made aware of referral to [facility] for further eval on gallbladder. Advised I will have our office call her next week with an appt for [facility]. Pt said ok and thank you so much for not making her wait the wknd for results.

## 2015-07-26 ENCOUNTER — Encounter (HOSPITAL_COMMUNITY)
Admission: RE | Admit: 2015-07-26 | Discharge: 2015-07-26 | Disposition: A | Discharge status: Home / Self Care | Payer: Medicare Other | Source: Ambulatory Visit | Attending: Interventional Cardiology | Admitting: Interventional Cardiology

## 2015-07-26 NOTE — Progress Notes (Signed)
Patient reported that she did not feel good last week and had dark urine last week. Patient reports that she feels "Kandis Ban" today. Temp 98.3. Blood pressure 118/70 heart rate 94. Patient told me that her liver function test was elevated and that she is scheduled to see the general surgeon on 08/15/18 due to elevated liver function test and abnormal abdominal ultrasound. Naina denies having any specific pain today. [place] did not exercise this morning at cardiac rehab. I will check with Tereso Newcomer [facility] when the patient may return to exercise at cardiac rehab.

## 2015-07-27 ENCOUNTER — Telehealth (HOSPITAL_COMMUNITY): Payer: Self-pay | Admitting: *Deleted

## 2015-07-27 ENCOUNTER — Telehealth: Payer: Self-pay | Admitting: *Deleted

## 2015-07-27 DIAGNOSIS — R7989 Other specified abnormal findings of blood chemistry: Secondary | ICD-10-CM

## 2015-07-27 DIAGNOSIS — R945 Abnormal results of liver function studies: Principal | ICD-10-CM

## 2015-07-27 NOTE — Telephone Encounter (Signed)
Pt agreeable to coming in 9/6 for repeat LFT, since sshe is not seeing Gen Surg until 9/19.

## 2015-07-27 NOTE — Telephone Encounter (Signed)
Pt notified of lab results by phone; Pt denies any fevers, chills or abdominal pain at this time; I did advise if so then go to the ED. Pt seeinf [facility] Gen surgery 9/19 .

## 2015-07-28 ENCOUNTER — Encounter (HOSPITAL_COMMUNITY): Payer: Medicare Other

## 2015-07-30 ENCOUNTER — Encounter (HOSPITAL_COMMUNITY): Payer: Medicare Other

## 2015-08-03 ENCOUNTER — Telehealth: Payer: Self-pay | Admitting: *Deleted

## 2015-08-03 ENCOUNTER — Other Ambulatory Visit (INDEPENDENT_AMBULATORY_CARE_PROVIDER_SITE_OTHER): Payer: Medicare Other | Admitting: *Deleted

## 2015-08-03 DIAGNOSIS — R7989 Other specified abnormal findings of blood chemistry: Secondary | ICD-10-CM

## 2015-08-03 DIAGNOSIS — R945 Abnormal results of liver function studies: Principal | ICD-10-CM

## 2015-08-03 LAB — HEPATIC FUNCTION PANEL
ALK PHOS: 830 U/L — AB (ref 39–117)
ALT: 93 U/L — ABNORMAL HIGH (ref 0–35)
AST: 66 U/L — AB (ref 0–37)
Albumin: 3.5 g/dL (ref 3.5–5.2)
Bilirubin, Direct: 0.3 mg/dL (ref 0.0–0.3)
Total Bilirubin: 0.8 mg/dL (ref 0.2–1.2)
Total Protein: 7.2 g/dL (ref 6.0–8.3)

## 2015-08-03 NOTE — Telephone Encounter (Signed)
Pt notified of lab results by phone and advised to please keep her appt with the Gen Surg next week for further follow up on Abnormal LFT.

## 2015-08-04 ENCOUNTER — Encounter (HOSPITAL_COMMUNITY): Payer: Medicare Other

## 2015-08-06 ENCOUNTER — Encounter (HOSPITAL_COMMUNITY): Payer: Medicare Other

## 2015-08-09 ENCOUNTER — Encounter (HOSPITAL_COMMUNITY): Payer: Medicare Other

## 2015-08-11 ENCOUNTER — Encounter (HOSPITAL_COMMUNITY): Payer: Medicare Other

## 2015-08-12 ENCOUNTER — Ambulatory Visit (INDEPENDENT_AMBULATORY_CARE_PROVIDER_SITE_OTHER): Payer: Medicare Other | Admitting: Interventional Cardiology

## 2015-08-12 ENCOUNTER — Encounter: Payer: Self-pay | Admitting: Interventional Cardiology

## 2015-08-12 ENCOUNTER — Encounter: Payer: Self-pay | Admitting: Gastroenterology

## 2015-08-12 VITALS — BP 112/68 | HR 94 | Ht 61.5 in | Wt 130.4 lb

## 2015-08-12 DIAGNOSIS — E785 Hyperlipidemia, unspecified: Secondary | ICD-10-CM

## 2015-08-12 DIAGNOSIS — I255 Ischemic cardiomyopathy: Secondary | ICD-10-CM | POA: Diagnosis not present

## 2015-08-12 DIAGNOSIS — I10 Essential (primary) hypertension: Secondary | ICD-10-CM

## 2015-08-12 DIAGNOSIS — I2510 Atherosclerotic heart disease of native coronary artery without angina pectoris: Secondary | ICD-10-CM

## 2015-08-12 DIAGNOSIS — R945 Abnormal results of liver function studies: Secondary | ICD-10-CM

## 2015-08-12 DIAGNOSIS — R7989 Other specified abnormal findings of blood chemistry: Secondary | ICD-10-CM

## 2015-08-12 DIAGNOSIS — J986 Disorders of diaphragm: Secondary | ICD-10-CM

## 2015-08-12 DIAGNOSIS — I2102 ST elevation (STEMI) myocardial infarction involving left anterior descending coronary artery: Secondary | ICD-10-CM | POA: Diagnosis not present

## 2015-08-12 DIAGNOSIS — Z951 Presence of aortocoronary bypass graft: Secondary | ICD-10-CM

## 2015-08-12 NOTE — Patient Instructions (Signed)
Medication Instructions:  Your physician recommends that you continue on your current medications as directed. Please refer to the Current Medication list given to you today.   Labwork: Your physician recommends that you return for lab work in: 3 months ([facility])  Testing/Procedures: None orderd  Follow-Up: Your physician recommends that you schedule a follow-up appointment in: 3 months with Dr.Smith  You have been referred to  [facility]    Any Other Special Instructions Will Be Listed Below (If Applicable).

## 2015-08-12 NOTE — Progress Notes (Signed)
Cardiology Office Note   Date:  08/12/2015   ID:  Alonnie Bieker, DOB May 07, 1941, MRN [id number]  PCP:  Leamon Arnt, MD  Cardiologist:  Sinclair Grooms, MD   Chief Complaint  Patient presents with  . Coronary Artery Disease      History of Present Illness: Dana Palmer is a 74 y.o. female who presents for a hx of hypertension, psoriasis, former tobacco abuse. Admitted 6/25-7/1 with an antero-lateral STEMI. Emergent cardiac catheterization demonstrated severe ostial-mid LAD disease with complex thrombus at the ostium and moderate nonobstructive disease in the RCA with a patent circumflex. EF was 35%. She was taken for emergent CABG with a LIMA-LAD and SVG-diagonal by Dr. Roxan Hockey. Postoperative course was fairly uneventful. She remained in sinus rhythm. Of note, echocardiogram 6/26 demonstrated EF 35% with periapical HK.  She is doing very well from cardiac standpoint. She denies dyspnea. She had emergency LIMA to LAD and saphenous vein graft to the diagonal. LVEF improved from 35% to 55% post surgery (echocardiogram). Impaired therapy with statin, Lipitor, was discontinued because she developed dark urine, and blood work then demonstrated hyperbilirubinemia and marked elevation in alkaline phosphatase out of proportion to other liver enzymes. Off statin therapy the most recent data reveals return of bilirubin to normal but a significant continued elevation in alkaline phosphatase greater than 800. Workup has included a CT scan and ultrasound. No definitive diagnosis is been made. A single gallstone has been identified.  Past Medical History  Diagnosis Date  . Psoriasis   . Essential hypertension   . CAD (coronary artery disease)     a. ant-lat STEMI 6/16 >> LHC:  ostial to mid LAD 99% with complex thrombus, mid to dist LAD 99%, D2 80%, prox RCA 50%, mid RCA 50%, EF 35% with ant-apical AK >>  b. s/p CABG (L-LAD, S-Dx)  . Ischemic cardiomyopathy     a. echo 6/16:  EF 35%, mild  LVH, periapical HK, Gr 2 DD;  b.  Echo 7/16:  Mild LVH, mild focal basal septal hypertrophy, EF 60-65%, no RWMA, trivial AI, MAC, mild increased PASP, trivial effusion  . HLD (hyperlipidemia)     Past Surgical History  Procedure Laterality Date  . Tubal ligation    . Carpal tunnel release    . Tonsillectomy    . Coronary artery bypass graft    . Cardiac catheterization N/A 05/22/2015    Procedure: Left Heart Cath and Coronary Angiography;  Surgeon: Belva Crome, MD;  Location: [place] CV LAB;  Service: Cardiovascular;  Laterality: N/A;  . Coronary artery bypass graft N/A 05/22/2015    Procedure: CORONARY ARTERY BYPASS GRAFTING (CABG);  Surgeon: Melrose Nakayama, MD;  Location: [place];  Service: Open Heart Surgery;  Laterality: N/A;  . Tee without cardioversion  05/22/2015    Procedure: TRANSESOPHAGEAL ECHOCARDIOGRAM (TEE);  Surgeon: Melrose Nakayama, MD;  Location: [place];  Service: Open Heart Surgery;;     Current Outpatient Prescriptions  Medication Sig Dispense Refill  . aspirin EC 325 MG EC tablet Take 1 tablet (325 mg total) by mouth daily.    . calcipotriene (DOVONOX) 0.005 % cream Apply 1 application topically 2 (two) times daily as needed (PSORASIS).     . clobetasol cream (TEMOVATE) 9.50 % Apply 1 application topically 2 (two) times daily as needed (PSORASIS).     Marland Kitchen losartan (COZAAR) 25 MG tablet Take 1 tablet (25 mg total) by mouth daily. 30 tablet 11  . metoprolol (LOPRESSOR) 50 MG  tablet Take 1.5 tablets (75 mg total) by mouth 2 (two) times daily. 90 tablet 11  . oxyCODONE (OXY IR/ROXICODONE) 5 MG immediate release tablet Take 1-2 tablets (5-10 mg total) by mouth every 4 (four) hours as needed for severe pain. (Patient not taking: Reported on 06/22/2015) 50 tablet 0   No current facility-administered medications for this visit.    Allergies:   Avelox; Lipitor; Codeine; Methotrexate derivatives; and Sulfa antibiotics    Social History:  The patient  reports that  she quit smoking about 14 years ago. She has never used smokeless tobacco. She reports that she does not drink alcohol or use illicit drugs.   Family History:  The patient's family history includes Heart attack in an other family member; Heart disease in her father; Heart failure in her mother; Pancreatic disease in her mother; Urolithiasis in her father.    ROS:  Please see the history of present illness.   Otherwise, review of systems are positive for easy bruising, history of psoriasis, some shortness of breath, identification of left hemidiaphragm paralysis post surgery. This may be improving. Appetite is improved..   All other systems are reviewed and negative.    PHYSICAL EXAM: VS:  BP 112/68 mmHg  Pulse 94  Ht 5' 1.5" (1.562 m)  Wt 59.149 kg (130 lb 6.4 oz)  BMI 24.24 kg/m2  SpO2 95% , BMI Body mass index is 24.24 kg/(m^2). GEN: Well nourished, well developed, in no acute distress HEENT: normal Neck: no JVD, carotid bruits, or masses Cardiac: RRR.  There is no murmur or rub. An S4 gallop is present. gallop. There is none edema. Sternal and lower extremity incisions are well healed Respiratory:  clear to auscultation bilaterally, normal work of breathing. GI: soft, nontender, nondistended, + BS MS: no deformity or atrophy Skin: warm and dry, no rash Neuro:  Strength and sensation are intact Psych: euthymic mood, full affect   EKG:  EKG is not ordered today.   Recent Labs: 05/23/2015: Magnesium 2.8* 05/25/2015: Hemoglobin 10.7*; Platelets 225 05/27/2015: TSH 5.500* 06/18/2015: BUN 12; Creatinine, Ser 0.78; Potassium 3.9; Sodium 142 08/03/2015: ALT 93*    Lipid Panel    Component Value Date/Time   CHOL 129 07/20/2015 0732   TRIG 70.0 07/20/2015 0732   HDL 27.70* 07/20/2015 0732   CHOLHDL 5 07/20/2015 0732   VLDL 14.0 07/20/2015 0732   LDLCALC 88 07/20/2015 0732      Wt Readings from Last 3 Encounters:  08/12/15 59.149 kg (130 lb 6.4 oz)  07/08/15 60.6 kg (133 lb 9.6  oz)  06/22/15 60.782 kg (134 lb)      Other studies Reviewed: Additional studies/ records that were reviewed today include: Review of recent office note by cardiovascular surgery and extenders here in our office. Review of CT and ultrasound of the abdomen. Review of all laboratory data. Unfortunately we had no laboratory data on liver function prior to surgery. Dr. and he may have this. Question is whether alkaline phosphatase is been elevated for quite some time.. .    ASSESSMENT AND PLAN:  1. S/P CABG x 2 Stable post emergency bypass surgery. No recurrence of angina.  2. Essential hypertension Controlled  3. Ischemic cardiomyopathy Post-MI echocardiogram has demonstrated return of LV function to normal  4. ST elevation myocardial infarction involving left anterior descending (LAD) coronary artery Treated with emergency coronary bypass surgery after spontaneous recanalization   5. Hyperlipidemia Not on therapy  6. Elevated alkaline phosphatase Possibly related to gallstone disease. Rule out  primary biliary cirrhosis.  7. 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 Hopefully this will resolve over time. She denies excessive dyspnea.  Current medicines are reviewed at length with the patient today.  The patient has the following concerns regarding medicines: Feels a statin therapy is the cause of her elevated liver enzymes. I let her know that I am uncertain about that correlation. My concern is a marked elevation in alkaline phosphatase..  The following changes/actions have been instituted:    GI consult along with a general surgical consult  Clinical follow-up with me in 3 months  Comprehensive metabolic panel on return appointment  I will withhold any therapy for hyperlipidemia until we have further information concerning the abnormal liver function tests.  Labs/ tests ordered today include:   Orders Placed This Encounter    Procedures  . Comp Met (CMET)  . Ambulatory referral to Gastroenterology     Disposition:   FU with HS in 3 months  Signed, Sinclair Grooms, MD  08/12/2015 2:21 PM    [address], [place], [place]  [address] Phone: [phone]; Fax: [phone]

## 2015-08-13 ENCOUNTER — Encounter (HOSPITAL_COMMUNITY): Payer: Medicare Other

## 2015-08-16 ENCOUNTER — Encounter (HOSPITAL_COMMUNITY): Payer: Medicare Other

## 2015-08-18 ENCOUNTER — Encounter (HOSPITAL_COMMUNITY): Payer: Medicare Other

## 2015-08-19 ENCOUNTER — Telehealth: Payer: Self-pay

## 2015-08-19 NOTE — Telephone Encounter (Signed)
Yes, that is ok 

## 2015-08-19 NOTE — Telephone Encounter (Signed)
The pt was instructed regarding the appt with Shanda Bumps, she was grateful to have a sooner appt and will call if she has any further concerns

## 2015-08-19 NOTE — Telephone Encounter (Signed)
The first available appt with any provider is 08/25/15 at 2 pm with Shanda Bumps, Is this ok?

## 2015-08-19 NOTE — Telephone Encounter (Signed)
-----  Message from Milus Banister, MD sent at 08/16/2015 11:05 AM EDT ----- Dorris Fetch,  Reviewed her info. Boy, she is very cholestatic. Wonder about PBC, drug toxicity more than biliary issue since bile duct was normal.  Wouldn't normally cause transient pain though.  We'll get her in this week to figure it out.  Thanks  Patty, She needs new GI appt this week with any Anaija Wissink, extender. I have an opening this afternoon if she can make that.   ----- Message -----    From: Stark Klein, MD    Sent: 08/16/2015   9:55 AM      To: Milus Banister, MD   Dan, This is a lady who was referred to me for "gallstones and elevated LFTs."  She actually has one gallstone that is 9 mm.  Her only symptom has been around 1 week of dark urine and elevated LFTs.  TBili was peak 2.1 and Alk phos was around 800.  I don't think the single gallstone caused her obstruction.  She has a 1 cm peripancreatic LN and her mother had pancreatic cancer.  Can we get an EUS?  She doesn;t have an appt until Oct 18 with Rob, but he can't EUS, and then she will get a bunch of other stuff before getting to you. tx FB

## 2015-08-20 ENCOUNTER — Encounter (HOSPITAL_COMMUNITY): Payer: Medicare Other

## 2015-08-23 ENCOUNTER — Telehealth: Payer: Self-pay | Admitting: Gastroenterology

## 2015-08-23 ENCOUNTER — Encounter (HOSPITAL_COMMUNITY): Payer: Medicare Other

## 2015-08-23 ENCOUNTER — Other Ambulatory Visit: Payer: Self-pay | Admitting: Thoracic Surgery (Cardiothoracic Vascular Surgery)

## 2015-08-23 DIAGNOSIS — Z951 Presence of aortocoronary bypass graft: Secondary | ICD-10-CM

## 2015-08-23 NOTE — Telephone Encounter (Signed)
Rec'd from [facility] Surgery forward 5 pages to Dr. Christella Hartigan

## 2015-08-24 ENCOUNTER — Ambulatory Visit (INDEPENDENT_AMBULATORY_CARE_PROVIDER_SITE_OTHER): Payer: Medicare Other | Admitting: Thoracic Surgery (Cardiothoracic Vascular Surgery)

## 2015-08-24 ENCOUNTER — Encounter: Payer: Self-pay | Admitting: Thoracic Surgery (Cardiothoracic Vascular Surgery)

## 2015-08-24 ENCOUNTER — Ambulatory Visit
Admission: RE | Admit: 2015-08-24 | Discharge: 2015-08-24 | Disposition: A | Discharge status: Home / Self Care | Payer: Medicare Other | Source: Ambulatory Visit | Attending: Thoracic Surgery (Cardiothoracic Vascular Surgery) | Admitting: Thoracic Surgery (Cardiothoracic Vascular Surgery)

## 2015-08-24 VITALS — BP 144/87 | HR 75 | Resp 20 | Ht 61.5 in | Wt 130.0 lb

## 2015-08-24 DIAGNOSIS — I213 ST elevation (STEMI) myocardial infarction of unspecified site: Secondary | ICD-10-CM

## 2015-08-24 DIAGNOSIS — Z951 Presence of aortocoronary bypass graft: Secondary | ICD-10-CM

## 2015-08-24 DIAGNOSIS — J986 Disorders of diaphragm: Secondary | ICD-10-CM | POA: Insufficient documentation

## 2015-08-24 NOTE — Progress Notes (Signed)
[address]       [place] [address]             [phone]       HPI: Mrs. Delsignore returns today for scheduled postoperative 3 month follow-up visit.  She is a 74 year old woman who presented with an ST elevation MI. She had severe LAD and diagonal disease and underwent emergency coronary bypass grafting 2 on 05/23/2015. Her postoperative course was unremarkable and she was discharged home on 05/28/2015.  I saw her in July. She was doing well but her CXR showed and elevated left hemidiaphragm.  In the interim since her last visit she has had some issues. She became very ill in August. Her liver enzymes were markedly elevated. She had an ultrasound and CT of the abdomen. Her bilirubin and transaminases have returned to normal, but her alkaline phosphatase level is still elevated. She does feel better. She has not had any anginal pain. She does get short of breath if she walks up as sharp incline or stairs. She will have to rest for a minute after climbing 1 flight of stairs, but then can go on from there.  Past Medical History  Diagnosis Date  . Psoriasis   . Essential hypertension   . CAD (coronary artery disease)     a. ant-lat STEMI 6/16 >> LHC:  ostial to mid LAD 99% with complex thrombus, mid to dist LAD 99%, D2 80%, prox RCA 50%, mid RCA 50%, EF 35% with ant-apical AK >>  b. s/p CABG (L-LAD, S-Dx)  . Ischemic cardiomyopathy     a. echo 6/16:  EF 35%, mild LVH, periapical HK, Gr 2 DD;  b.  Echo 7/16:  Mild LVH, mild focal basal septal hypertrophy, EF 60-65%, no RWMA, trivial AI, MAC, mild increased PASP, trivial effusion  . HLD (hyperlipidemia)      Current Outpatient Prescriptions  Medication Sig Dispense Refill  . aspirin EC 325 MG EC tablet Take 1 tablet (325 mg total) by mouth daily.    . calcipotriene (DOVONOX) 0.005 % cream Apply 1 application topically 2 (two) times daily as needed (PSORASIS).     . clobetasol cream (TEMOVATE) 0.05 % Apply 1  application topically 2 (two) times daily as needed (PSORASIS).     Marland Kitchen losartan (COZAAR) 25 MG tablet Take 1 tablet (25 mg total) by mouth daily. 30 tablet 11  . metoprolol (LOPRESSOR) 50 MG tablet Take 1.5 tablets (75 mg total) by mouth 2 (two) times daily. 90 tablet 11   No current facility-administered medications for this visit.    Physical Exam BP 144/87 mmHg  Pulse 75  Resp 20  Ht 5' 1.5" (1.562 m)  Wt 130 lb (58.968 kg)  BMI 24.17 kg/m2  SpO39 31% 74 year old woman in no acute distress Alert and oriented 3 with no focal deficits Cardiac regular rate and rhythm normal S1 and S2 Lungs diminished breath sounds left base  Diagnostic Tests: I personally reviewed her chest x-ray. It shows an elevated left hemidiaphragm. There is no effusion or infiltrate.  Impression: 74 year old woman who is now 3 months out from emergency coronary bypass grafting 2. 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 I once again reviewed with her that this is a relatively common occurrence after bypass grafting. It typically resolves within 6-9 months and about 90% of the cases. In rare instances it is permanent. Treatment will depend on whether she is sufficiently symptomatic to warrant  plication of the diaphragm, but that would not be done until year had passed.   Plan: Return in 6 months with repeat PA and lateral chest x-ray. She knows to call sooner if she is having issues with shortness of breath.  [name], MD [facility] [phone]

## 2015-08-25 ENCOUNTER — Ambulatory Visit (INDEPENDENT_AMBULATORY_CARE_PROVIDER_SITE_OTHER): Payer: Medicare Other | Admitting: Gastroenterology

## 2015-08-25 ENCOUNTER — Encounter: Payer: Self-pay | Admitting: Gastroenterology

## 2015-08-25 ENCOUNTER — Encounter (HOSPITAL_COMMUNITY): Payer: Medicare Other

## 2015-08-25 ENCOUNTER — Other Ambulatory Visit (INDEPENDENT_AMBULATORY_CARE_PROVIDER_SITE_OTHER): Payer: Medicare Other

## 2015-08-25 VITALS — BP 138/78 | Ht 61.5 in | Wt 130.5 lb

## 2015-08-25 DIAGNOSIS — R7989 Other specified abnormal findings of blood chemistry: Secondary | ICD-10-CM | POA: Diagnosis not present

## 2015-08-25 DIAGNOSIS — R945 Abnormal results of liver function studies: Secondary | ICD-10-CM

## 2015-08-25 LAB — HEPATIC FUNCTION PANEL
ALBUMIN: 3.9 g/dL (ref 3.5–5.2)
ALT: 21 U/L (ref 0–35)
AST: 22 U/L (ref 0–37)
Alkaline Phosphatase: 223 U/L — ABNORMAL HIGH (ref 39–117)
BILIRUBIN TOTAL: 0.4 mg/dL (ref 0.2–1.2)
Bilirubin, Direct: 0.1 mg/dL (ref 0.0–0.3)
Total Protein: 7.5 g/dL (ref 6.0–8.3)

## 2015-08-25 LAB — COMPREHENSIVE METABOLIC PANEL
ALBUMIN: 3.9 g/dL (ref 3.5–5.2)
ALK PHOS: 223 U/L — AB (ref 39–117)
ALT: 21 U/L (ref 0–35)
AST: 22 U/L (ref 0–37)
BILIRUBIN TOTAL: 0.4 mg/dL (ref 0.2–1.2)
BUN: 16 mg/dL (ref 6–23)
CO2: 30 mEq/L (ref 19–32)
Calcium: 9.7 mg/dL (ref 8.4–10.5)
Chloride: 104 mEq/L (ref 96–112)
Creatinine, Ser: 0.75 mg/dL (ref 0.40–1.20)
GFR: 80.4 mL/min (ref >60.00)
GLUCOSE: 88 mg/dL (ref 70–99)
POTASSIUM: 4.1 meq/L (ref 3.5–5.1)
SODIUM: 140 meq/L (ref 135–145)
TOTAL PROTEIN: 7.5 g/dL (ref 6.0–8.3)

## 2015-08-25 LAB — IBC PANEL
IRON: 42 ug/dL (ref 42–145)
Saturation Ratios: 9.1 % — ABNORMAL LOW (ref 20.0–50.0)
Transferrin: 329 mg/dL (ref 212.0–360.0)

## 2015-08-25 LAB — IGA: IgA: 0 mg/dL — ABNORMAL LOW (ref 68–378)

## 2015-08-25 LAB — FERRITIN: Ferritin: 12.8 ng/mL (ref 10.0–291.0)

## 2015-08-25 NOTE — Patient Instructions (Signed)
Your physician has requested that you go to the basement for the following lab work before leaving today: Hepatic function, IBC, Ferritin, IgA, tissue transglutamase, Alpha 1, Mitrochondrial antibody, ANA, anti smooth muscle antibody, ceruloplasmin, hepatitis b surface antibody, hepatiti b surface antigen, hepatitis c antibody, hepatitis a antibody total.

## 2015-08-26 ENCOUNTER — Telehealth: Payer: Self-pay

## 2015-08-26 ENCOUNTER — Encounter: Payer: Self-pay | Admitting: Gastroenterology

## 2015-08-26 DIAGNOSIS — R945 Abnormal results of liver function studies: Principal | ICD-10-CM

## 2015-08-26 DIAGNOSIS — R7989 Other specified abnormal findings of blood chemistry: Secondary | ICD-10-CM | POA: Insufficient documentation

## 2015-08-26 HISTORY — DX: Other specified abnormal findings of blood chemistry: R79.89

## 2015-08-26 LAB — HEPATITIS A ANTIBODY, TOTAL: Hep A Total Ab: NONREACTIVE

## 2015-08-26 LAB — HEPATITIS B SURFACE ANTIBODY,QUALITATIVE: HEP B S AB: POSITIVE — AB

## 2015-08-26 LAB — ANA: Anti Nuclear Antibody(ANA): NEGATIVE

## 2015-08-26 LAB — MITOCHONDRIAL ANTIBODIES: MITOCHONDRIAL M2 AB, IGG: 1.37 — AB (ref <0.91)

## 2015-08-26 LAB — HEPATITIS B SURFACE ANTIGEN: Hepatitis B Surface Ag: NEGATIVE

## 2015-08-26 LAB — HEPATITIS C ANTIBODY: HCV Ab: NEGATIVE

## 2015-08-26 LAB — TISSUE TRANSGLUTAMINASE, IGA: Tissue Transglutaminase Ab, IgA: 1 U/mL (ref <4)

## 2015-08-26 NOTE — Progress Notes (Signed)
Agree with initial assessment and plans 

## 2015-08-26 NOTE — Telephone Encounter (Signed)
-----   Message from Lyn Records, MD sent at 08/26/2015  1:29 PM EDT ----- Labs if shown significant improvement. Still needs to see gastroenterology for the elevated alkaline phosphatase.

## 2015-08-26 NOTE — Progress Notes (Signed)
08/25/2015 Dana Palmer [id number] [birth year]   HISTORY OF PRESENT ILLNESS:  This is a pleasant 74 year old female who is new to her practice, referred by her PCP, Dr. Mardelle Matte. She was referred here for evaluation regarding elevated LFTs. She tells me that she underwent CABG 2 on 05/22/2015 for coronary artery disease. She was started on Lipitor at that time. No LFTs were checked until August at which time her LFTs were elevated, particularly alkaline phosphatase at 914, AST 133, ALT 180, and bilirubin of 2.0. There are no other previous LFTs listed in our system, but she says that she has labs done routinely by her PCP and was never told that they have been elevated in the past. Since this elevation her Lipitor has been held and her LFTs have improved with a recheck one week after the initial labs with an alkaline phosphatase of 830, AST 66, ALT 93, and total bili normal at 0.8. She says that around the time that these elevated LFTs were discovered she started having dark scant urine and just generally felt terrible. No specific GI complaints in general. She had an ultrasound of her abdomen that showed a 9 mm gallstone, complex cyst in the right upper pole, and possible 2 cm hypoechoic structure dorsal to the uncinate process that could represent adjacent lymph node or possible uncinate process mass. CT scan of the abdomen with and without contrast was then performed and showed a 1 cm Gastrohepatic/PeriPancreatic lymph node accounting for the findings in question on the ultrasound exam. No pancreatic masses were identified. There was also indistinctness and mild thickening of the gallbladder wall which raised question of acute and/or chronic cholecystitis versus adenomyomatosis. She did see Dr. Donell Beers with [facility] surgery and they have elected for no cholecystectomy at this time.  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     Past Medical  History  Diagnosis Date  . Psoriasis   . Essential hypertension   . CAD (coronary artery disease)     a. ant-lat STEMI 6/16 >> LHC:  ostial to mid LAD 99% with complex thrombus, mid to dist LAD 99%, D2 80%, prox RCA 50%, mid RCA 50%, EF 35% with ant-apical AK >>  b. s/p CABG (L-LAD, S-Dx)  . Ischemic cardiomyopathy     a. echo 6/16:  EF 35%, mild LVH, periapical HK, Gr 2 DD;  b.  Echo 7/16:  Mild LVH, mild focal basal septal hypertrophy, EF 60-65%, no RWMA, trivial AI, MAC, mild increased PASP, trivial effusion  . HLD (hyperlipidemia)   . Gallstone   . Pneumonia   . UTI (lower urinary tract infection)    Past Surgical History  Procedure Laterality Date  . Tubal ligation    . Carpal tunnel release    . Tonsillectomy    . Coronary artery bypass graft    . Cardiac catheterization N/A 05/22/2015    Procedure: Left Heart Cath and Coronary Angiography;  Surgeon: Lyn Records, MD;  Location: [facility] INVASIVE CV LAB;  Service: Cardiovascular;  Laterality: N/A;  . Coronary artery bypass graft N/A 05/22/2015    Procedure: CORONARY ARTERY BYPASS GRAFTING (CABG);  Surgeon: [name], MD;  Location: [facility] OR;  Service: Open Heart Surgery;  Laterality: N/A;  . Tee without cardioversion  05/22/2015    Procedure: TRANSESOPHAGEAL ECHOCARDIOGRAM (TEE);  Surgeon: [name], MD;  Location: [facility] OR;  Service: Open Heart Surgery;;    reports  that she quit smoking about 14 years ago. Her smoking use included Cigarettes. She has a 25 pack-year smoking history. She has never used smokeless tobacco. She reports that she does not drink alcohol or use illicit drugs. family history includes Heart attack in her paternal grandfather; Heart block in her sister; Heart disease in her father; Heart failure in her mother; Hypertension in her sister; Liver cancer in her mother; Pancreatic disease in her mother; Prostate cancer in her son; Stroke in her sister; Urolithiasis in her father. Allergies  Allergen  Reactions  . Avelox [Moxifloxacin] Other (See Comments)    Irregular heart rate, mucoid stools, SOB  . Lipitor [Atorvastatin] Other (See Comments)    REACTION: "TURN URINE BROWN AND WACK OUT LABS"  . Codeine Nausea And Vomiting and Rash  . Methotrexate Derivatives Nausea And Vomiting  . Sulfa Antibiotics Nausea Only and Rash      Outpatient Encounter Prescriptions as of 08/25/2015  Medication Sig  . aspirin EC 325 MG EC tablet Take 1 tablet (325 mg total) by mouth daily.  . calcipotriene (DOVONOX) 0.005 % cream Apply 1 application topically 2 (two) times daily as needed (PSORASIS).   . clobetasol cream (TEMOVATE) 0.05 % Apply 1 application topically 2 (two) times daily as needed (PSORASIS).   Marland Kitchen losartan (COZAAR) 25 MG tablet Take 1 tablet (25 mg total) by mouth daily.  . metoprolol (LOPRESSOR) 50 MG tablet Take 1.5 tablets (75 mg total) by mouth 2 (two) times daily.   No facility-administered encounter medications on file as of 08/25/2015.     REVIEW OF SYSTEMS  : All other systems reviewed and negative except where noted in the History of Present Illness.   PHYSICAL EXAM: BP 138/78 mmHg  Ht 5' 1.5" (1.562 m)  Wt 130 lb 8 oz (59.194 kg)  BMI 24.26 kg/m2 General: Well developed white female in no acute distress Head: Normocephalic and atraumatic Eyes:  Sclerae anicteric, conjunctiva pink. Ears: Normal auditory acuity Lungs: Clear throughout to auscultation Heart: Regular rate and rhythm Abdomen: Soft, non-distended.  Normal bowel sounds.  Non-tender. Musculoskeletal: Symmetrical with no gross deformities  Skin: No lesions on visible extremities Extremities: No edema  Neurological: Alert oriented x 4, grossly non-focal Psychological:  Alert and cooperative. Normal mood and affect  ASSESSMENT AND PLAN: -Elevated LFT's:  Primarily elevation of ALP, but other transaminases elevated as well.  Thought to be due to Lipitor which was just started in 04/2015 after her CABG.  I do not  have any LFT's for comparison, but she reports no elevation in the past.  Will try to get some previous labs from her PCP.  She will remain off of her Lipitor.  LFT's are slowly improving but ALP still very high.  Will run all of the liver serologic testing as well for completion.  **Addendum:  After her visit I did find previous labs in Care Everywhere. Previously in May 2016, June 2015, and April 2014 her LFTs were all normal.  CC:  Willow Ora, MD

## 2015-08-26 NOTE — Telephone Encounter (Signed)
Pt aware of lab results. Labs if shown significant improvement. Still needs to see gastroenterology for the elevated alkaline phosphatase. Pt was seen by GI on 9/28. Pt verbalized understanding.

## 2015-08-27 ENCOUNTER — Encounter (HOSPITAL_COMMUNITY): Payer: Medicare Other

## 2015-08-27 LAB — ANTI-SMOOTH MUSCLE ANTIBODY, IGG: SMOOTH MUSCLE AB: 11 U (ref <20)

## 2015-08-30 ENCOUNTER — Encounter (HOSPITAL_COMMUNITY): Payer: Medicare Other

## 2015-08-30 LAB — ALPHA-1-ANTITRYPSIN: A1 ANTITRYPSIN SER: 201 mg/dL — AB (ref 83–199)

## 2015-08-30 LAB — CERULOPLASMIN: CERULOPLASMIN: 34 mg/dL (ref 18–53)

## 2015-09-01 ENCOUNTER — Encounter (HOSPITAL_COMMUNITY): Payer: Medicare Other

## 2015-09-01 ENCOUNTER — Telehealth: Payer: Self-pay | Admitting: Interventional Cardiology

## 2015-09-01 NOTE — Telephone Encounter (Signed)
Called [facility] and spoke with Rosanne Ashing, informing him that this medication Lisinopril 10 was not on the pt's medication list and that the doctor that prescribed this medication was not in this practice. Rosanne Ashing verbalized understanding.

## 2015-09-03 ENCOUNTER — Encounter (HOSPITAL_COMMUNITY): Payer: Medicare Other

## 2015-09-06 ENCOUNTER — Encounter (HOSPITAL_COMMUNITY): Payer: Medicare Other

## 2015-09-08 ENCOUNTER — Encounter (HOSPITAL_COMMUNITY): Payer: Medicare Other

## 2015-09-10 ENCOUNTER — Encounter (HOSPITAL_COMMUNITY): Payer: Medicare Other

## 2015-09-10 ENCOUNTER — Other Ambulatory Visit: Payer: Self-pay | Admitting: Physician Assistant

## 2015-09-10 NOTE — Progress Notes (Signed)
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

## 2015-09-13 ENCOUNTER — Encounter (HOSPITAL_COMMUNITY): Payer: Medicare Other

## 2015-09-14 ENCOUNTER — Ambulatory Visit: Payer: Medicare Other | Admitting: Gastroenterology

## 2015-09-15 ENCOUNTER — Encounter (HOSPITAL_COMMUNITY): Payer: Medicare Other

## 2015-09-16 NOTE — Progress Notes (Signed)
Quick Note:  I doubt Humira would have caused that I can call her next week when I am back at work Please message me back that you have told her this so I have a reminder ______

## 2015-09-17 ENCOUNTER — Encounter (HOSPITAL_COMMUNITY): Payer: Medicare Other

## 2015-09-20 ENCOUNTER — Encounter (HOSPITAL_COMMUNITY): Payer: Medicare Other

## 2015-09-22 ENCOUNTER — Encounter (HOSPITAL_COMMUNITY): Payer: Medicare Other

## 2015-09-24 ENCOUNTER — Encounter (HOSPITAL_COMMUNITY): Payer: Medicare Other

## 2015-09-27 ENCOUNTER — Encounter (HOSPITAL_COMMUNITY): Payer: Medicare Other

## 2015-09-29 ENCOUNTER — Encounter (HOSPITAL_COMMUNITY): Payer: Medicare Other

## 2015-10-01 ENCOUNTER — Encounter (HOSPITAL_COMMUNITY): Payer: Medicare Other

## 2015-10-04 ENCOUNTER — Encounter (HOSPITAL_COMMUNITY): Payer: Medicare Other

## 2015-10-06 ENCOUNTER — Encounter (HOSPITAL_COMMUNITY): Payer: Medicare Other

## 2015-10-08 ENCOUNTER — Encounter (HOSPITAL_COMMUNITY): Payer: Medicare Other

## 2015-10-11 ENCOUNTER — Encounter (HOSPITAL_COMMUNITY): Payer: Medicare Other

## 2015-10-13 ENCOUNTER — Encounter (HOSPITAL_COMMUNITY): Payer: Medicare Other

## 2015-10-15 ENCOUNTER — Encounter (HOSPITAL_COMMUNITY): Payer: Medicare Other

## 2015-12-01 NOTE — Progress Notes (Signed)
Cardiology Office Note   Date:  12/02/2015   ID:  Dana DuverneyMarti Cosio, DOB [birth year], MRN [id number]  PCP:  Willow OraANDY,CAMILLE L, MD  Cardiologist:  Lesleigh NoeSMITH III,HENRY W, MD   Chief Complaint  Patient presents with  . Coronary Artery Disease      History of Present Illness: Dana Palmer is a 75 y.o. female who presents for CAD, CABG, acute systolic heart failure resolved to normal, and hyperlipidemia.  The atrial fibrillation that the patient had was postoperative. Postop LV dysfunction resolved to normal LV function. 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  Past Medical History  Diagnosis Date  . Psoriasis   . Essential hypertension   . CAD (coronary artery disease)     a. ant-lat STEMI 6/16 >> LHC:  ostial to mid LAD 99% with complex thrombus, mid to dist LAD 99%, D2 80%, prox RCA 50%, mid RCA 50%, EF 35% with ant-apical AK >>  b. s/p CABG (L-LAD, S-Dx)  . Ischemic cardiomyopathy     a. echo 6/16:  EF 35%, mild LVH, periapical HK, Gr 2 DD;  b.  Echo 7/16:  Mild LVH, mild focal basal septal hypertrophy, EF 60-65%, no RWMA, trivial AI, MAC, mild increased PASP, trivial effusion  . HLD (hyperlipidemia)   . Gallstone   . Pneumonia   . UTI (lower urinary tract infection)     Past Surgical History  Procedure Laterality Date  . Tubal ligation    . Carpal tunnel release    . Tonsillectomy    . Coronary artery bypass graft    . Cardiac catheterization N/A 05/22/2015    Procedure: Left Heart Cath and Coronary Angiography;  Surgeon: Lyn RecordsHenry W Smith, MD;  [facility];  Service: Cardiovascular;  Laterality: N/A;  . Coronary artery bypass graft N/A 05/22/2015    Procedure: CORONARY ARTERY BYPASS GRAFTING (CABG);  Surgeon: Loreli SlotSteven C Hendrickson, MD;  [facility];  Service: Open Heart Surgery;  Laterality: N/A;  . Tee without cardioversion  05/22/2015    Procedure: TRANSESOPHAGEAL ECHOCARDIOGRAM (TEE);  Surgeon: Loreli SlotSteven C Hendrickson, MD;  [facility];  Service: Open Heart  Surgery;;     Current Outpatient Prescriptions  Medication Sig Dispense Refill  . aspirin EC 325 MG EC tablet Take 1 tablet (325 mg total) by mouth daily.    . calcipotriene (DOVONOX) 0.005 % cream Apply 1 application topically 2 (two) times daily as needed (PSORASIS).     . clobetasol cream (TEMOVATE) 0.05 % Apply 1 application topically 2 (two) times daily as needed (PSORASIS).     Marland Kitchen. losartan (COZAAR) 25 MG tablet Take 1 tablet (25 mg total) by mouth daily. 30 tablet 11  . metoprolol (LOPRESSOR) 50 MG tablet Take 1.5 tablets (75 mg total) by mouth 2 (two) times daily. 90 tablet 11   No current facility-administered medications for this visit.    Allergies:   Avelox; Lipitor; Codeine; Methotrexate derivatives; and Sulfa antibiotics    Social History:  The patient  reports that she quit smoking about 14 years ago. Her smoking use included Cigarettes. She has a 25 pack-year smoking history. She has never used smokeless tobacco. She reports that she does not drink alcohol or use illicit drugs.   Family History:  The patient's family history includes Heart attack in her paternal grandfather; Heart block in her sister; Heart disease in her father; Heart failure in her mother; Hypertension in her sister; Liver cancer in her mother; Pancreatic disease in her mother; Prostate cancer  in her son; Stroke in her sister; Urolithiasis in her father.    ROS:  Please see the history of present illness.   Otherwise, review of systems are positive for easy bruising.   All other systems are reviewed and negative.    PHYSICAL EXAM: VS:  BP 138/84 mmHg  Pulse 82  Ht 5\' 2"  (1.575 m)  Wt 132 lb 6.4 oz (60.056 kg)  BMI 24.21 kg/m2 , BMI Body mass index is 24.21 kg/(m^2). GEN: Well nourished, well developed, in no acute distress HEENT: normal Neck: no JVD, carotid bruits, or masses Cardiac: RRR.  There is no murmur, rub, or gallop. There is no edema. Respiratory:  clear to auscultation bilaterally,  normal work of breathing. GI: soft, nontender, nondistended, + BS MS: no deformity or atrophy Skin: warm and dry, no rash Neuro:  Strength and sensation are intact Psych: euthymic mood, full affect   EKG:  EKG is not ordered today   Recent Labs: 05/23/2015: Magnesium 2.8* 05/25/2015: Hemoglobin 10.7*; Platelets 225 05/27/2015: TSH 5.500* 08/25/2015: ALT 21; ALT 21; BUN 16; Creatinine, Ser 0.75; Potassium 4.1; Sodium 140    Lipid Panel    Component Value Date/Time   CHOL 129 07/20/2015 0732   TRIG 70.0 07/20/2015 0732   HDL 27.70* 07/20/2015 0732   CHOLHDL 5 07/20/2015 0732   VLDL 14.0 07/20/2015 0732   LDLCALC 88 07/20/2015 0732      Wt Readings from Last 3 Encounters:  12/02/15 132 lb 6.4 oz (60.056 kg)  08/25/15 130 lb 8 oz (59.194 kg)  08/24/15 130 lb (58.968 kg)      Other studies Reviewed: Additional studies/ records that were reviewed today include: None. The findings include none.    ASSESSMENT AND PLAN:  1. Coronary artery disease involving native coronary artery of native heart without angina pectoris Stable without angina  2. Hyperlipidemia Unable to use statin therapy due to hepatic inflammation  3. Essential hypertension Controlled  4. Ischemic cardiomyopathy Resolved  5. ST elevation myocardial infarction involving left anterior descending (LAD) coronary artery [facility]) Presentation for CAD  6. Elevated LFTs Related to statin therapy. Resolved to normal according to patient related to recent laboratory data with Dr. Donell Beers  7. Left diaphragm hemiparalysis post surgery  Current medicines are reviewed at length with the patient today.  The patient has the following concerns regarding medicines: None.  The following changes/actions have been instituted:    Decrease aspirin to 81 mg per day  Labs/ tests ordered today include:  No orders of the defined types were placed in this encounter.     Disposition:   FU with HS in 1  year  Signed, Lesleigh Noe, MD  12/02/2015 2:26 PM    [facility] [address] [place], [place], [place]  [address] Phone: [phone]; Fax: [phone]

## 2015-12-02 ENCOUNTER — Encounter: Payer: Self-pay | Admitting: Interventional Cardiology

## 2015-12-02 ENCOUNTER — Ambulatory Visit (INDEPENDENT_AMBULATORY_CARE_PROVIDER_SITE_OTHER): Payer: Medicare Other | Admitting: Interventional Cardiology

## 2015-12-02 ENCOUNTER — Other Ambulatory Visit (INDEPENDENT_AMBULATORY_CARE_PROVIDER_SITE_OTHER): Payer: Medicare Other | Admitting: *Deleted

## 2015-12-02 VITALS — BP 138/84 | HR 82 | Ht 62.0 in | Wt 132.4 lb

## 2015-12-02 DIAGNOSIS — R7989 Other specified abnormal findings of blood chemistry: Secondary | ICD-10-CM

## 2015-12-02 DIAGNOSIS — R945 Abnormal results of liver function studies: Secondary | ICD-10-CM

## 2015-12-02 DIAGNOSIS — I10 Essential (primary) hypertension: Secondary | ICD-10-CM | POA: Diagnosis not present

## 2015-12-02 DIAGNOSIS — I2510 Atherosclerotic heart disease of native coronary artery without angina pectoris: Secondary | ICD-10-CM

## 2015-12-02 DIAGNOSIS — I255 Ischemic cardiomyopathy: Secondary | ICD-10-CM | POA: Diagnosis not present

## 2015-12-02 DIAGNOSIS — E785 Hyperlipidemia, unspecified: Secondary | ICD-10-CM

## 2015-12-02 DIAGNOSIS — I2102 ST elevation (STEMI) myocardial infarction involving left anterior descending coronary artery: Secondary | ICD-10-CM

## 2015-12-02 NOTE — Addendum Note (Signed)
Addended by: Tonita PhoenixBOWDEN, ROBIN K on: 12/02/2015 02:43 PM   Modules accepted: Orders

## 2015-12-02 NOTE — Addendum Note (Signed)
Addended by: Tonita PhoenixBOWDEN, Farris Geiman K on: 12/02/2015 02:24 PM   Modules accepted: Orders

## 2015-12-02 NOTE — Patient Instructions (Signed)
Medication Instructions:  Your physician recommends that you continue on your current medications as directed. Please refer to the Current Medication list given to you today.   Labwork: None ordered  Testing/Procedures: None ordered  Follow-Up: Your physician wants you to follow-up in: 6 months with Dr. Smith. You will receive a reminder letter in the mail two months in advance. If you don't receive a letter, please call our office to schedule the follow-up appointment.   Any Other Special Instructions Will Be Listed Below (If Applicable).     If you need a refill on your cardiac medications before your next appointment, please call your pharmacy.   

## 2015-12-03 LAB — COMPLETE METABOLIC PANEL WITH GFR
ALT: 15 U/L (ref 6–29)
AST: 18 U/L (ref 10–35)
Albumin: 3.6 g/dL (ref 3.6–5.1)
Alkaline Phosphatase: 66 U/L (ref 33–130)
BUN: 15 mg/dL (ref 7–25)
CHLORIDE: 104 mmol/L (ref 98–110)
CO2: 25 mmol/L (ref 20–31)
CREATININE: 0.79 mg/dL (ref 0.60–0.93)
Calcium: 9.2 mg/dL (ref 8.6–10.4)
GFR, EST AFRICAN AMERICAN: 86 mL/min (ref >=60)
GFR, Est Non African American: 74 mL/min (ref >=60)
GLUCOSE: 83 mg/dL (ref 65–99)
Potassium: 3.7 mmol/L (ref 3.5–5.3)
SODIUM: 140 mmol/L (ref 135–146)
TOTAL PROTEIN: 7 g/dL (ref 6.1–8.1)
Total Bilirubin: 0.3 mg/dL (ref 0.2–1.2)

## 2016-01-12 ENCOUNTER — Telehealth: Payer: Self-pay | Admitting: Interventional Cardiology

## 2016-01-12 ENCOUNTER — Ambulatory Visit
Admission: RE | Admit: 2016-01-12 | Discharge: 2016-01-12 | Disposition: A | Discharge status: Home / Self Care | Payer: Medicare Other | Source: Ambulatory Visit | Attending: Interventional Cardiology | Admitting: Interventional Cardiology

## 2016-01-12 DIAGNOSIS — J986 Disorders of diaphragm: Secondary | ICD-10-CM

## 2016-01-12 NOTE — Telephone Encounter (Signed)
New Message  Pt c/o of Muscular  Pain: 1. Are you having CP right now? YES  2. Are you experiencing any other symptoms (ex. SOB, nausea, vomiting, sweating)?NO  3. How long have you been experiencing Muscular Pain? The problem Occurred Sunday evening. She thought that it would work itself out but it has gotten even more tender.  4. Is your Muscular Pain continuous or coming and going? Coming and going with motion and when she draws deep breaths its sharp  Comments: Pt states that it is muscular its not Chest pain.. She states that the surgery damaged her lung to where the left lung doesn't inflate. This is where the problem is occuring. Rolled over and her elbow went into her rib cage on the left and she ca'nt draw deep breaths. Pt proclaims that she is not sure if she should speak with Dr. Katrinka Blazing or the surgeon. Please call back to discuss.

## 2016-01-12 NOTE — Telephone Encounter (Signed)
Pt aware of Dr.Smith's recommendation. Pt should take Ibuprofen  3 times daily with meals for 1 week. Pt should have a cxr asap. Adv pt that she should go to GI to have her cxr done this afternoon. Adv her that we will call her with the results after they are reviewed by Dr.Smith. Pt verbalized understanding and sts that she will head to GI now.address provided to pt.

## 2016-01-13 ENCOUNTER — Other Ambulatory Visit: Payer: Self-pay

## 2016-01-13 MED ORDER — ASPIRIN EC 81 MG PO TBEC: 81.0000 mg | DELAYED_RELEASE_TABLET | Freq: Every day | ORAL | Status: DC

## 2016-01-13 NOTE — Telephone Encounter (Signed)
Pt aware of cxr results. CXR is ok. Pt verbalized understanding.

## 2016-01-13 NOTE — Telephone Encounter (Signed)
-----   Message from Lyn Records, MD sent at 01/12/2016  8:38 PM EST ----- CXR is okay.

## 2016-02-21 ENCOUNTER — Other Ambulatory Visit: Payer: Self-pay | Admitting: Thoracic Surgery (Cardiothoracic Vascular Surgery)

## 2016-02-21 DIAGNOSIS — Z951 Presence of aortocoronary bypass graft: Secondary | ICD-10-CM

## 2016-02-22 ENCOUNTER — Ambulatory Visit (INDEPENDENT_AMBULATORY_CARE_PROVIDER_SITE_OTHER): Payer: Medicare Other | Admitting: Thoracic Surgery (Cardiothoracic Vascular Surgery)

## 2016-02-22 ENCOUNTER — Encounter: Payer: Self-pay | Admitting: Thoracic Surgery (Cardiothoracic Vascular Surgery)

## 2016-02-22 ENCOUNTER — Ambulatory Visit
Admission: RE | Admit: 2016-02-22 | Discharge: 2016-02-22 | Disposition: A | Discharge status: Home / Self Care | Payer: Medicare Other | Source: Ambulatory Visit | Attending: Thoracic Surgery (Cardiothoracic Vascular Surgery) | Admitting: Thoracic Surgery (Cardiothoracic Vascular Surgery)

## 2016-02-22 VITALS — BP 155/84 | HR 67 | Resp 16 | Ht 62.0 in | Wt 131.5 lb

## 2016-02-22 DIAGNOSIS — Z951 Presence of aortocoronary bypass graft: Secondary | ICD-10-CM

## 2016-02-22 DIAGNOSIS — J986 Disorders of diaphragm: Secondary | ICD-10-CM

## 2016-02-22 DIAGNOSIS — I213 ST elevation (STEMI) myocardial infarction of unspecified site: Secondary | ICD-10-CM | POA: Diagnosis not present

## 2016-02-22 NOTE — Progress Notes (Signed)
[address]       [place] [address]             [phone]       HPI: Dana Palmer returns today for a scheduled follow-up visit.  She is a 75 year old woman who had emergency coronary bypass grafting 2 in June 2016. Postoperatively she was noted to have an elevated left hemidiaphragm. We did not have any preoperative studies, but the assumption is that was new secondary to her surgery.  I saw in the office about 6 months ago. She was making slow but steady progress at that time.  She feels like she is doing well. She has not had any recurrent anginal pain. She still does get short of breath with exertion, but says she recovers more rapidly than she was previously.  Past Medical History  Diagnosis Date  . Psoriasis   . Essential hypertension   . CAD (coronary artery disease)     a. ant-lat STEMI 6/16 >> LHC:  ostial to mid LAD 99% with complex thrombus, mid to dist LAD 99%, D2 80%, prox RCA 50%, mid RCA 50%, EF 35% with ant-apical AK >>  b. s/p CABG (L-LAD, S-Dx)  . Ischemic cardiomyopathy     a. echo 6/16:  EF 35%, mild LVH, periapical HK, Gr 2 DD;  b.  Echo 7/16:  Mild LVH, mild focal basal septal hypertrophy, EF 60-65%, no RWMA, trivial AI, MAC, mild increased PASP, trivial effusion  . HLD (hyperlipidemia)   . Gallstone   . Pneumonia   . UTI (lower urinary tract infection)       Current Outpatient Prescriptions  Medication Sig Dispense Refill  . aspirin EC 81 MG tablet Take 1 tablet (81 mg total) by mouth daily.    . calcipotriene (DOVONOX) 0.005 % cream Apply 1 application topically 2 (two) times daily as needed (PSORASIS).     . clobetasol cream (TEMOVATE) 0.05 % Apply 1 application topically 2 (two) times daily as needed (PSORASIS).     Marland Kitchen losartan (COZAAR) 25 MG tablet Take 1 tablet (25 mg total) by mouth daily. 30 tablet 11  . metoprolol (LOPRESSOR) 50 MG tablet Take 1.5 tablets (75 mg total) by mouth 2 (two) times daily. 90 tablet 11   No  current facility-administered medications for this visit.    Physical Exam BP 155/84 mmHg  Pulse 67  Resp 16  Ht  (1.575 m)  Wt 131 lb 8 oz (59.648 kg)  BMI 24.05 kg/m2  SpO57 6% 75 year old woman in no acute distress Alert and oriented 3 with no focal deficits Cardiac regular rate and rhythm normal S1 and S2 Lungs with equal breath sounds bilaterally No peripheral edema  Diagnostic Tests: CHEST 2 VIEW  COMPARISON: 01/12/2016. 08/24/2015.  FINDINGS: Prior CABG. Cardiomegaly with normal pulmonary vascularity. Calcified pulmonary nodule left mid lung. Left base subsegmental atelectasis and/or scarring. Stable elevation left hemidiaphragm. No prominent pleural effusion. No pneumothorax.  IMPRESSION: 1. Prior CABG. Cardiomegaly. No pulmonary venous congestion.  2. Left base subsegmental atelectasis and/or pleural parenchymal scarring again noted, no interim change. Stable elevation left hemidiaphragm . Able calcified nodule left mid lung.   Electronically Signed  By: Maisie Fus Register I personally reviewed the chest x-ray and concur with the findings as noted above  Impression: Dana Palmer 75 year old woman who underwent emergency coronary bypass grafting about 9 months ago. She was noted to have an elevated left hemidiaphragm on her postoperative x-rays. That persists on  her x-ray today, but her breath sounds are better at the left base than they were during her last visit.  She is only mildly symptomatic and it is not affecting her quality of life. If she were to become more symptomatic plication of the left hemidiaphragm would be an option. However, she is not at all interested in that at this time.  The fact that she has better breath sounds at the left base is encouraging there may be some limited movement of the diaphragm at this point. I do not think further radiographic follow-up is necessary in the absence of a change in  symptoms.  Plan: Follow-up with Dr. Katrinka BlazingSmith for cardiology care.  I will be happy to see her back any time if I can be of any further assistance with her care.  I spent 10 minutes with Mrs. Joelene MillinOliver during this visit, greater than 50% spent in counseling. Loreli SlotSteven C Lavera Vandermeer, MD [facility] [phone]

## 2016-04-24 ENCOUNTER — Emergency Department (HOSPITAL_COMMUNITY): Payer: Medicare Other

## 2016-04-24 ENCOUNTER — Encounter (HOSPITAL_COMMUNITY): Payer: Self-pay

## 2016-04-24 ENCOUNTER — Emergency Department (HOSPITAL_COMMUNITY)
Admission: EM | Admit: 2016-04-24 | Discharge: 2016-04-24 | Disposition: A | Discharge status: Home / Self Care | Payer: Medicare Other | Attending: Emergency Medicine | Admitting: Emergency Medicine

## 2016-04-24 DIAGNOSIS — Z951 Presence of aortocoronary bypass graft: Secondary | ICD-10-CM | POA: Insufficient documentation

## 2016-04-24 DIAGNOSIS — Z87891 Personal history of nicotine dependence: Secondary | ICD-10-CM | POA: Insufficient documentation

## 2016-04-24 DIAGNOSIS — Z8719 Personal history of other diseases of the digestive system: Secondary | ICD-10-CM | POA: Diagnosis not present

## 2016-04-24 DIAGNOSIS — Z7982 Long term (current) use of aspirin: Secondary | ICD-10-CM | POA: Insufficient documentation

## 2016-04-24 DIAGNOSIS — Z79899 Other long term (current) drug therapy: Secondary | ICD-10-CM | POA: Diagnosis not present

## 2016-04-24 DIAGNOSIS — Z8701 Personal history of pneumonia (recurrent): Secondary | ICD-10-CM | POA: Insufficient documentation

## 2016-04-24 DIAGNOSIS — Z87440 Personal history of urinary (tract) infections: Secondary | ICD-10-CM | POA: Diagnosis not present

## 2016-04-24 DIAGNOSIS — R002 Palpitations: Secondary | ICD-10-CM | POA: Insufficient documentation

## 2016-04-24 DIAGNOSIS — Z872 Personal history of diseases of the skin and subcutaneous tissue: Secondary | ICD-10-CM | POA: Insufficient documentation

## 2016-04-24 DIAGNOSIS — Z9889 Other specified postprocedural states: Secondary | ICD-10-CM | POA: Insufficient documentation

## 2016-04-24 DIAGNOSIS — Z8639 Personal history of other endocrine, nutritional and metabolic disease: Secondary | ICD-10-CM | POA: Insufficient documentation

## 2016-04-24 DIAGNOSIS — I10 Essential (primary) hypertension: Secondary | ICD-10-CM | POA: Diagnosis not present

## 2016-04-24 DIAGNOSIS — I2510 Atherosclerotic heart disease of native coronary artery without angina pectoris: Secondary | ICD-10-CM | POA: Diagnosis not present

## 2016-04-24 LAB — I-STAT TROPONIN, ED
TROPONIN I, POC: 0 ng/mL (ref 0.00–0.08)
Troponin i, poc: 0 ng/mL (ref 0.00–0.08)

## 2016-04-24 LAB — CBC
HCT: 40.6 % (ref 36.0–46.0)
HEMOGLOBIN: 12.6 g/dL (ref 12.0–15.0)
MCH: 26.3 pg (ref 26.0–34.0)
MCHC: 31 g/dL (ref 30.0–36.0)
MCV: 84.6 fL (ref 78.0–100.0)
Platelets: 238 10*3/uL (ref 150–400)
RBC: 4.8 MIL/uL (ref 3.87–5.11)
RDW: 12.9 % (ref 11.5–15.5)
WBC: 7.8 10*3/uL (ref 4.0–10.5)

## 2016-04-24 LAB — BASIC METABOLIC PANEL
ANION GAP: 8 (ref 5–15)
BUN: 19 mg/dL (ref 6–20)
CALCIUM: 9.7 mg/dL (ref 8.9–10.3)
CO2: 27 mmol/L (ref 22–32)
Chloride: 106 mmol/L (ref 101–111)
Creatinine, Ser: 0.88 mg/dL (ref 0.44–1.00)
GFR calc Af Amer: >60 mL/min (ref >60)
GFR calc non Af Amer: >60 mL/min (ref >60)
GLUCOSE: 90 mg/dL (ref 65–99)
Potassium: 3.7 mmol/L (ref 3.5–5.1)
Sodium: 141 mmol/L (ref 135–145)

## 2016-04-24 LAB — D-DIMER, QUANTITATIVE: D-Dimer, Quant: 0.37 ug/mL-FEU (ref 0.00–0.50)

## 2016-04-24 MED ORDER — METOPROLOL TARTRATE 25 MG PO TABS
75.0000 mg | ORAL_TABLET | Freq: Once | ORAL | Status: AC
  Administered 2016-04-24: 75 mg via ORAL
  Filled 2016-04-24: qty 3

## 2016-04-24 MED ORDER — ASPIRIN 81 MG PO CHEW
243.0000 mg | CHEWABLE_TABLET | Freq: Once | ORAL | Status: AC
  Administered 2016-04-24: 243 mg via ORAL
  Filled 2016-04-24: qty 3

## 2016-04-24 NOTE — Discharge Instructions (Signed)
You have been seen today for palpitations. Your imaging and lab tests showed no acute abnormalities.  The EKG had no concerning elements. Follow up with your cardiologist as soon as possible to inform him of this incident. Follow up with PCP as needed. Return to ED should symptoms worsen.

## 2016-04-24 NOTE — ED Notes (Signed)
Per PT, Pt is coming from home. Pt reports having cardiac double bypass last year. Pt reports today being in the garden when she started to have "skipped beats" and chest tightness within the last hour accompanied by dizziness. Pt is alert and oriented x4 upon arrival.

## 2016-04-24 NOTE — ED Notes (Signed)
PA at bedside.

## 2016-04-24 NOTE — ED Notes (Signed)
Xray transporter came to pick up patient.  Patient not in room yet.  Will gather from lobby and return to room after xray.

## 2016-04-24 NOTE — ED Notes (Signed)
PA at bedside updating pt 

## 2016-04-24 NOTE — ED Notes (Addendum)
Pt sts she did feel palpitations again, but only lasted a few seconds.  This RN encouraged pt to call out when/if she experiences it again so RN can confirm on the monitor if there are any changes.

## 2016-04-24 NOTE — ED Provider Notes (Signed)
CSN: [id number]     Arrival date & time 04/24/16  1451 History   First MD Initiated Contact with Patient 04/24/16 1538     Chief Complaint  Patient presents with  . Palpitations     (Consider location/radiation/quality/duration/timing/severity/associated sxs/prior Treatment) HPI   Dana Palmer is a 75 y.o. female, with a history of MI with CABG, presenting to the ED with palpitations that began suddenly while the patient was working in the garden this morning. Patient denies extra stress or exertion. Patient states that the sensation scared her because she had a MI that resulted in CABG last June and she did not present in a typical manner. Patient denies chest pain, shortness of breath, dizziness, nausea/vomiting, cough, recent illness, fever/chills, or any other complaints. Dr. Katrinka Blazing is patient's cardiologist. Dr. Dorris Fetch performed the patient's CABG.  Past Medical History  Diagnosis Date  . Psoriasis   . Essential hypertension   . CAD (coronary artery disease)     a. ant-lat STEMI 6/16 >> LHC:  ostial to mid LAD 99% with complex thrombus, mid to dist LAD 99%, D2 80%, prox RCA 50%, mid RCA 50%, EF 35% with ant-apical AK >>  b. s/p CABG (L-LAD, S-Dx)  . Ischemic cardiomyopathy     a. echo 6/16:  EF 35%, mild LVH, periapical HK, Gr 2 DD;  b.  Echo 7/16:  Mild LVH, mild focal basal septal hypertrophy, EF 60-65%, no RWMA, trivial AI, MAC, mild increased PASP, trivial effusion  . HLD (hyperlipidemia)   . Gallstone   . Pneumonia   . UTI (lower urinary tract infection)    Past Surgical History  Procedure Laterality Date  . Tubal ligation    . Carpal tunnel release    . Tonsillectomy    . Coronary artery bypass graft    . Cardiac catheterization N/A 05/22/2015    Procedure: Left Heart Cath and Coronary Angiography;  Surgeon: Lyn Records, MD;  Location: [facility] INVASIVE CV LAB;  Service: Cardiovascular;  Laterality: N/A;  . Coronary artery bypass graft N/A 05/22/2015    Procedure:  CORONARY ARTERY BYPASS GRAFTING (CABG);  Surgeon: [name], MD;  Location: [facility] OR;  Service: Open Heart Surgery;  Laterality: N/A;  . Tee without cardioversion  05/22/2015    Procedure: TRANSESOPHAGEAL ECHOCARDIOGRAM (TEE);  Surgeon: [name], MD;  Location: [facility] OR;  Service: Open Heart Surgery;;   Family History  Problem Relation Age of Onset  . Heart attack Paternal Grandfather   . Heart disease Father   . Urolithiasis Father   . Pancreatic disease Mother   . Heart failure Mother   . Liver cancer Mother   . Heart block Sister   . Hypertension Sister   . Stroke Sister   . Prostate cancer Son    Social History  Substance Use Topics  . Smoking status: Former Smoker -- 0.50 packs/day for 50 years    Types: Cigarettes    Quit date: 05/21/2001  . Smokeless tobacco: Never Used  . Alcohol Use: No   OB History    No data available     Review of Systems  Constitutional: Negative for fever, chills and diaphoresis.  Respiratory: Negative for cough and shortness of breath.   Cardiovascular: Positive for palpitations. Negative for chest pain.  Gastrointestinal: Negative for nausea and vomiting.  Neurological: Negative for dizziness.  All other systems reviewed and are negative.     Allergies  Avelox; Lipitor; Codeine; Methotrexate derivatives; Sulfa antibiotics; Humira; Monosodium glutamate; and  Sorbitol  Home Medications   Prior to Admission medications   Medication Sig Start Date End Date Taking? Authorizing Provider  aspirin EC 81 MG tablet Take 1 tablet (81 mg total) by mouth daily. Patient taking differently: Take 81 mg by mouth daily with supper.  01/13/16  Yes Lyn RecordsHenry W Smith, MD  clobetasol cream (TEMOVATE) 0.05 % Apply 1 application topically 2 (two) times daily as needed (psoriasis).    Yes Historical Provider, MD  losartan (COZAAR) 25 MG tablet Take 1 tablet (25 mg total) by mouth daily. 06/08/15  Yes Scott Moishe Spice Weaver, PA-C  metoprolol (LOPRESSOR) 50  MG tablet Take 1.5 tablets (75 mg total) by mouth 2 (two) times daily. 06/08/15  Yes Scott T Weaver, PA-C   BP 153/97 mmHg  Pulse 70  Temp(Src) 97.8 F (36.6 C) (Oral)  Resp 20  Ht 5' 1.5" (1.562 m)  Wt 61.735 kg  BMI 25.30 kg/m2  SpO2 98% Physical Exam  Constitutional: She is oriented to person, place, and time. She appears well-developed and well-nourished. No distress.  HENT:  Head: Normocephalic and atraumatic.  Eyes: Conjunctivae are normal.  Neck: Neck supple.  Cardiovascular: Normal rate, regular rhythm, normal heart sounds and intact distal pulses.   Pulmonary/Chest: Effort normal and breath sounds normal. No respiratory distress. She exhibits no tenderness.  Abdominal: Soft. There is no tenderness. There is no guarding.  Musculoskeletal: She exhibits no edema or tenderness.  Lymphadenopathy:    She has no cervical adenopathy.  Neurological: She is alert and oriented to person, place, and time.  Skin: Skin is warm and dry. She is not diaphoretic.  Psychiatric: She has a normal mood and affect. Her behavior is normal.  Nursing note and vitals reviewed.   ED Course  Procedures (including critical care time) Labs Review Labs Reviewed  BASIC METABOLIC PANEL  CBC  D-DIMER, QUANTITATIVE (NOT [facility])  Rosezena SensorI-STAT TROPOININ, ED  Rosezena SensorI-STAT TROPOININ, ED    Imaging Review Dg Chest 2 View  04/24/2016  CLINICAL DATA:  Chest pain EXAM: CHEST  2 VIEW COMPARISON:  02/22/2016 FINDINGS: There is elevation of the left diaphragm. There is a calcified left upper lobe pulmonary nodule. There is no focal consolidation. There is no pleural effusion or pneumothorax. The heart and mediastinal contours are unremarkable. There is evidence of prior CABG. The osseous structures are unremarkable. IMPRESSION: No active cardiopulmonary disease. Electronically Signed   By: Elige KoHetal  Patel   On: 04/24/2016 15:35   I have personally reviewed and evaluated these images and lab results as part of my medical  decision-making.   EKG Interpretation   Date/Time:  Monday Apr 24 2016 14:58:24 EDT Ventricular Rate:  74 PR Interval:  178 QRS Duration: 90 QT Interval:  436 QTC Calculation: 483 R Axis:   42 Text Interpretation:  Normal sinus rhythm Possible Left atrial enlargement  Possible Anterior infarct , age undetermined Abnormal ECG TW changes seen  on previous ECG have resolved Confirmed by [facility] Center[facility]LOS[facility]AN MD, ERIN ([id number]) on  04/24/2016 4:06:08 PM       Medications  aspirin chewable tablet 243 mg (243 mg Oral Given 04/24/16 1617)  metoprolol tartrate (LOPRESSOR) tablet 75 mg (75 mg Oral Given 04/24/16 1915)   Orders Placed This Encounter  Procedures  . DG Chest 2 View  . Basic metabolic panel  . CBC  . D-dimer, quantitative (not at [facility])  . Cardiac monitoring  . Saline Lock IV, Maintain IV access  . Pulse oximetry, continuous  . I-stat troponin, ED  .  I-stat troponin, ED  . EKG 12-Lead  . ED EKG within 10 minutes    MDM   Final diagnoses:  Palpitations    Elzora Cullins presents with palpitations, chest tightness, shortness breath, and dizziness that began suddenly this morning.  Findings and plan of care discussed with Alvira Monday, MD. Dr. Dalene Seltzer personally evaluated and examined this patient.  Patient had a 2 vessel CABG in June 2016. Relatively normal echo in July 2016 with stage I diastolic dysfunction. This was a last echo performed on this patient. Pt states her current presentation is nothing like her presentation with her MI. Low suspicion for ACS. HEART score is 3, indicating low risk for a cardiac event. Wells criteria score is 0, indicating low risk for PE. Patient is nontoxic appearing, afebrile, not tachycardic, not tachypneic, not hypotensive, maintains adequate SPO2 on room air, and is in no apparent distress. Patient has no signs of sepsis or other serious or life-threatening condition. Delta troponins negative. Patient voiced concern about her hypertension. I  suspect this is because the patient's evening dose of metoprolol is pending. Patient advised to follow-up with her cardiologist as soon as possible on this matter. Return precautions discussed. Patient voiced understanding of these instructions, agrees to the plan, and is comfortable with discharge.   Filed Vitals:   04/24/16 1535 04/24/16 1600 04/24/16 1715 04/24/16 1745  BP: 153/97 146/84 160/68 173/95  Pulse: 70 71 71 71  Temp:      TempSrc:      Resp: 20 20 19 18   Height:      Weight:      SpO2: 98% 95% 95% 96%   Filed Vitals:   04/24/16 1745 04/24/16 1900 04/24/16 1915 04/24/16 1958  BP: 173/95 165/78 165/78 161/90  Pulse: 71 75  68  Temp:      TempSrc:      Resp: 18 21  19   Height:      Weight:      SpO2: 96% 97%  97%     Anselm Pancoast, PA-C 04/24/16 2005  Alvira Monday, MD 04/26/16 1229

## 2016-04-24 NOTE — ED Notes (Signed)
Patient being transported to Radiology Department at this time; transporter [facility] after procedure is completed [facility]

## 2016-04-24 NOTE — ED Notes (Signed)
Patient able to ambulate independently  

## 2016-04-24 NOTE — ED Notes (Signed)
MD at bedside. 

## 2016-06-11 ENCOUNTER — Other Ambulatory Visit: Payer: Self-pay | Admitting: Physician Assistant

## 2016-06-23 ENCOUNTER — Other Ambulatory Visit: Payer: Self-pay | Admitting: Physician Assistant

## 2016-06-23 DIAGNOSIS — I10 Essential (primary) hypertension: Secondary | ICD-10-CM

## 2016-07-03 ENCOUNTER — Emergency Department (HOSPITAL_COMMUNITY): Payer: Medicare Other

## 2016-07-03 ENCOUNTER — Emergency Department (HOSPITAL_COMMUNITY)
Admission: EM | Admit: 2016-07-03 | Discharge: 2016-07-03 | Disposition: A | Discharge status: Home / Self Care | Payer: Medicare Other | Attending: Emergency Medicine | Admitting: Emergency Medicine

## 2016-07-03 ENCOUNTER — Encounter (HOSPITAL_COMMUNITY): Payer: Self-pay | Admitting: Emergency Medicine

## 2016-07-03 DIAGNOSIS — Z87891 Personal history of nicotine dependence: Secondary | ICD-10-CM | POA: Diagnosis not present

## 2016-07-03 DIAGNOSIS — Z79899 Other long term (current) drug therapy: Secondary | ICD-10-CM | POA: Insufficient documentation

## 2016-07-03 DIAGNOSIS — Z7982 Long term (current) use of aspirin: Secondary | ICD-10-CM | POA: Diagnosis not present

## 2016-07-03 DIAGNOSIS — S22080A Wedge compression fracture of T11-T12 vertebra, initial encounter for closed fracture: Secondary | ICD-10-CM | POA: Diagnosis not present

## 2016-07-03 DIAGNOSIS — I252 Old myocardial infarction: Secondary | ICD-10-CM | POA: Diagnosis not present

## 2016-07-03 DIAGNOSIS — Y929 Unspecified place or not applicable: Secondary | ICD-10-CM | POA: Insufficient documentation

## 2016-07-03 DIAGNOSIS — Y999 Unspecified external cause status: Secondary | ICD-10-CM | POA: Insufficient documentation

## 2016-07-03 DIAGNOSIS — I2510 Atherosclerotic heart disease of native coronary artery without angina pectoris: Secondary | ICD-10-CM | POA: Insufficient documentation

## 2016-07-03 DIAGNOSIS — W010XXA Fall on same level from slipping, tripping and stumbling without subsequent striking against object, initial encounter: Secondary | ICD-10-CM | POA: Insufficient documentation

## 2016-07-03 DIAGNOSIS — I10 Essential (primary) hypertension: Secondary | ICD-10-CM | POA: Insufficient documentation

## 2016-07-03 DIAGNOSIS — S22000A Wedge compression fracture of unspecified thoracic vertebra, initial encounter for closed fracture: Secondary | ICD-10-CM

## 2016-07-03 DIAGNOSIS — S299XXA Unspecified injury of thorax, initial encounter: Secondary | ICD-10-CM | POA: Diagnosis present

## 2016-07-03 DIAGNOSIS — S63501A Unspecified sprain of right wrist, initial encounter: Secondary | ICD-10-CM | POA: Insufficient documentation

## 2016-07-03 DIAGNOSIS — Z951 Presence of aortocoronary bypass graft: Secondary | ICD-10-CM | POA: Diagnosis not present

## 2016-07-03 DIAGNOSIS — S20221A Contusion of right back wall of thorax, initial encounter: Secondary | ICD-10-CM

## 2016-07-03 DIAGNOSIS — S300XXA Contusion of lower back and pelvis, initial encounter: Secondary | ICD-10-CM | POA: Insufficient documentation

## 2016-07-03 DIAGNOSIS — Y9301 Activity, walking, marching and hiking: Secondary | ICD-10-CM | POA: Diagnosis not present

## 2016-07-03 LAB — URINALYSIS, ROUTINE W REFLEX MICROSCOPIC
Bilirubin Urine: NEGATIVE
Glucose, UA: NEGATIVE mg/dL
HGB URINE DIPSTICK: NEGATIVE
Ketones, ur: NEGATIVE mg/dL
LEUKOCYTES UA: NEGATIVE
Nitrite: NEGATIVE
PROTEIN: NEGATIVE mg/dL
Specific Gravity, Urine: 1.016 (ref 1.005–1.030)
pH: 6.5 (ref 5.0–8.0)

## 2016-07-03 MED ORDER — TRAMADOL HCL 50 MG PO TABS
50.0000 mg | ORAL_TABLET | Freq: Once | ORAL | Status: AC
  Administered 2016-07-03: 50 mg via ORAL
  Filled 2016-07-03: qty 1

## 2016-07-03 MED ORDER — TRAMADOL HCL 50 MG PO TABS: 50.0000 mg | ORAL_TABLET | Freq: Four times a day (QID) | ORAL | 0 refills | Status: DC | PRN

## 2016-07-03 NOTE — Progress Notes (Signed)
Orthopedic Tech Progress Note Patient Details:  Dana Palmer 03-Jun-1941 [id number]  Ortho Devices Type of Ortho Device: Arm sling, Thumb spica splint Ortho Device/Splint Interventions: Application   Saul FordyceJennifer C Sequita Wise 07/03/2016, 1:04 PM

## 2016-07-03 NOTE — ED Notes (Signed)
Pt states she has swelling over her R kidney. Rn notified.

## 2016-07-03 NOTE — ED Provider Notes (Signed)
[facility]-EM[facility]Y DEPT Provider Note   CSN: [id number] Arrival date & time: 07/03/16  7829  First Provider Contact:  First MD Initiated Contact with Patient 07/03/16 1015        History   Chief Complaint Chief Complaint  Patient presents with  . Fall  . Arm Injury  . Back Pain    HPI Dana Palmer is a 75 y.o. female.  Patient presents after a fall. She states she was walking outside and slipped on a wet step, falling backward onto the concrete. She fell onto her buttocks area. She did not hit her head. She denies any neck pain. She has some pain over her tailbone and her right upper back. She also is mostly complaining of pain to her right wrist. She denies any other injuries. No chest pain or shortness of breath. No nausea or vomiting. She has an abrasion to her right arm. She's not sure when her last tetanus shot was. Her daughter is attempting to contact her PCP to find out.      Past Medical History:  Diagnosis Date  . CAD (coronary artery disease)    a. ant-lat STEMI 6/16 >> LHC:  ostial to mid LAD 99% with complex thrombus, mid to dist LAD 99%, D2 80%, prox RCA 50%, mid RCA 50%, EF 35% with ant-apical AK >>  b. s/p CABG (L-LAD, S-Dx)  . Essential hypertension   . Gallstone   . HLD (hyperlipidemia)   . Ischemic cardiomyopathy    a. echo 6/16:  EF 35%, mild LVH, periapical HK, Gr 2 DD;  b.  Echo 7/16:  Mild LVH, mild focal basal septal hypertrophy, EF 60-65%, no RWMA, trivial AI, MAC, mild increased PASP, trivial effusion  . Pneumonia   . Psoriasis   . UTI (lower urinary tract infection)     Patient Active Problem List   Diagnosis Date Noted  . Elevated LFTs 08/26/2015  . 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 Abnormal liver function tests 08/12/2015  . 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 Coronary artery disease involving native coronary artery of native heart without angina pectoris 06/07/2015  . Ischemic cardiomyopathy 06/07/2015  . Essential hypertension  06/07/2015  . Hyperlipidemia 06/07/2015  . 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 STEMI (ST elevation myocardial infarction) (HCC) 05/22/2015  . Psoriasis 05/22/2015    Past Surgical History:  Procedure Laterality Date  . CARDIAC CATHETERIZATION N/A 05/22/2015   Procedure: Left Heart Cath and Coronary Angiography;  Surgeon: Lyn Records, MD;  [facility]AS[facility] CV LAB;  Service: Cardiovascular;  Laterality: N/A;  . CARPAL TUNNEL RELEASE    . CORONARY ARTERY BYPASS GRAFT    . CORONARY ARTERY BYPASS GRAFT N/A 05/22/2015   Procedure: CORONARY ARTERY BYPASS GRAFTING (CABG);  Surgeon: [name], MD;  Location: [facility] OR;  Service: Open Heart Surgery;  Laterality: N/A;  . TEE WITHOUT CARDIOVERSION  05/22/2015   Procedure: TRANSESOPHAGEAL ECHOCARDIOGRAM (TEE);  Surgeon: [name], MD;  Location: [facility] OR;  Service: Open Heart Surgery;;  . TONSILLECTOMY    . TUBAL LIGATION    . WRIST FRACTURE SURGERY Bilateral 11/26/2008   Open reduction    OB History    No data available       Home Medications    Prior to Admission medications   Medication Sig Start Date End Date Taking? Authorizing Provider  aspirin EC 81 MG tablet Take 1 tablet (81 mg total) by mouth daily. Patient taking differently: Take 81 mg by mouth daily with supper.  01/13/16  Yes Lyn RecordsHenry W Smith, MD  clobetasol cream (TEMOVATE) 0.05 % Apply 1 application topically 2 (two) times daily as needed (psoriasis).    Yes Historical Provider, MD  losartan (COZAAR) 25 MG tablet TAKE 1 TABLET (25 MG TOTAL) BY MOUTH DAILY. 06/12/16  Yes Scott T Alben SpittleWeaver, PA-C  metoprolol (LOPRESSOR) 50 MG tablet TAKE 1 & 1/2 TABLETS BY MOUTH TWICE DAILY Patient taking differently: TAKE 75 MG BY MOUTH TWICE DAILY 06/23/16  Yes Beatrice LecherScott T Weaver, PA-C  traMADol (ULTRAM) 50 MG tablet Take 1 tablet (50 mg total) by mouth every 6 (six) hours as needed. 07/03/16   Rolan BuccoMelanie Girtrude Enslin, MD    Family History Family History  Problem Relation Age of Onset  . Heart disease  Father   . Urolithiasis Father   . Pancreatic disease Mother   . Heart failure Mother   . Liver cancer Mother   . Heart attack Paternal Grandfather   . Heart block Sister   . Hypertension Sister   . Stroke Sister   . Prostate cancer Son     Social History Social History  Substance Use Topics  . Smoking status: Former Smoker    Packs/day: 0.50    Years: 50.00    Types: Cigarettes    Quit date: 05/21/2001  . Smokeless tobacco: Never Used  . Alcohol use No     Allergies   Avelox [moxifloxacin]; Humira [adalimumab]; Lipitor [atorvastatin]; Codeine; Methotrexate derivatives; Sulfa antibiotics; Monosodium glutamate; and Sorbitol   Review of Systems Review of Systems  Constitutional: Negative for chills, diaphoresis, fatigue and fever.  HENT: Negative for congestion, rhinorrhea and sneezing.   Eyes: Negative.   Respiratory: Negative for cough, chest tightness and shortness of breath.   Cardiovascular: Negative for chest pain and leg swelling.  Gastrointestinal: Negative for abdominal pain, blood in stool, diarrhea, nausea and vomiting.  Genitourinary: Negative for difficulty urinating, flank pain, frequency and hematuria.  Musculoskeletal: Positive for arthralgias and back pain.  Skin: Positive for wound. Negative for rash.  Neurological: Negative for dizziness, speech difficulty, weakness, numbness and headaches.     Physical Exam Updated Vital Signs BP 168/92 (BP Location: Left Arm)   Pulse 77   Temp 98 F (36.7 C)   Resp 20   SpO2 96%   Physical Exam  Constitutional: She is oriented to person, place, and time. She appears well-developed and well-nourished.  HENT:  Head: Normocephalic and atraumatic.  Eyes: Pupils are equal, round, and reactive to light.  Neck: Normal range of motion. Neck supple.  No pain along the cervical thoracic or lumbosacral spine. There is some tenderness to the lower sacrum and coccyx area.  Cardiovascular: Normal rate, regular rhythm  and normal heart sounds.   Pulmonary/Chest: Effort normal and breath sounds normal. No respiratory distress. She has no wheezes. She has no rales. She exhibits no tenderness.  Abdominal: Soft. Bowel sounds are normal. There is no tenderness. There is no rebound and no guarding.  Musculoskeletal: Normal range of motion. She exhibits no edema.  Positive deformity and tenderness to the right wrist. There is no pain to the elbow or shoulder. She has been intact pulses. She has normal sensation and motor function in the hand. There is a small abrasion just distal to the right elbow. There is no other pain on palpation or range of motion extremities.  Lymphadenopathy:    She has no cervical adenopathy.  Neurological: She is alert and oriented to person, place, and time.  Skin: Skin is  warm and dry. No rash noted.  Psychiatric: She has a normal mood and affect.     ED Treatments / Results  Labs (all labs ordered are listed, but only abnormal results are displayed) Labs Reviewed  URINALYSIS, ROUTINE W REFLEX MICROSCOPIC (NOT AT [facility])    EKG  EKG Interpretation None       Radiology Dg Ribs Unilateral W/chest Right  Result Date: 07/03/2016 CLINICAL DATA:  75 year old female fell backwards today with rib right wrist and tail bone pain. Initial encounter. EXAM: RIGHT RIBS AND CHEST - 3+ VIEW COMPARISON:  Chest radiographs 04/24/2016 and earlier. FINDINGS: Chronic elevation of the left hemidiaphragm is stable. Stable cardiac size and mediastinal contours. Left lung granuloma unchanged since 05/23/2015. No pneumothorax or pleural effusion. No pulmonary edema or acute pulmonary opacity identified. Sequelae of CABG. Right lower rib marker placed at the tip of the right eleventh rib. The twelfth ribs are hypoplastic or absent. Lower rib detail is suboptimal due to osteopenia. No displaced lower rib fracture identified. However, there is evidence of a superior endplate compression fracture of what appears  to be the T12 level (best seen on image 3). This appears to be new or increased since May. IMPRESSION: 1. Suspicion of acute T12 compression fracture. Thoracic spine radiographs may confirm. But if specific therapy such as vertebroplasty is desired, Lumbar MRI or Nuclear Medicine Whole-body Bone Scan would best evaluate further. 2. Osteopenia.  No displaced right rib fracture identified. 3.  No acute cardiopulmonary abnormality. Electronically Signed   By: Odessa Fleming M.D.   On: 07/03/2016 11:31   Dg Thoracic Spine 2 View  Result Date: 07/03/2016 CLINICAL DATA:  RIGHT thoracic spine pain and swelling, fell today off a wooden deck, back pain EXAM: THORACIC SPINE 2 VIEWS COMPARISON:  Chest radiographs 04/24/2016 ; CT abdomen and pelvis 07/23/2015 FINDINGS: Hypoplastic twelfth repair. Diffuse osseous demineralization. Dextroconvex thoracic scoliosis. Mild scattered disc space narrowing. Subtle superior endplate compression deformity of T12 vertebral body with minimal anterior height loss, new since 07/23/2015. No additional fracture, subluxation or bone destruction. Calcified granuloma LEFT mid LEFT lung. Post CABG. Visualized ribs grossly intact. IMPRESSION: Subtle superior endplate compression fracture of T12 vertebral body with minimal anterior height loss new since 07/23/2015. Osseous demineralization with mild scattered degenerative disc disease changes and dextroconvex thoracic scoliosis. Electronically Signed   By: Ulyses Southward M.D.   On: 07/03/2016 13:16   Dg Sacrum/coccyx  Result Date: 07/03/2016 CLINICAL DATA:  Status post fall today with a coccyx injury and pain. Initial encounter. EXAM: SACRUM AND COCCYX - 2+ VIEW COMPARISON:  None. FINDINGS: There is no evidence of fracture or other focal bone lesions. Degenerative disc disease L5-S1 noted. IMPRESSION: No acute abnormality. Electronically Signed   By: Drusilla Kanner M.D.   On: 07/03/2016 11:25   Dg Wrist Complete Right  Result Date:  07/03/2016 CLINICAL DATA:  75 year old female fell backwards today with rib right wrist and tail bone pain. Initial encounter. EXAM: RIGHT WRIST - COMPLETE 3+ VIEW COMPARISON:  None. FINDINGS: Previous distal right radius ORIF. Hardware appears intact. No acute fracture of the distal right radius or ulna identified. Radiocarpal joint space loss. Scaphoid intact. Carpal bone alignment within normal limits. Metacarpals appear intact. IMPRESSION: Prior posttraumatic and postoperative changes at the right wrist with no definite acute fracture or dislocation identified. Electronically Signed   By: Odessa Fleming M.D.   On: 07/03/2016 11:25    Procedures Procedures (including critical care time)  Medications Ordered in ED Medications  traMADol (ULTRAM) tablet 50 mg (50 mg Oral Given 07/03/16 1325)     Initial Impression / Assessment and Plan / ED Course  I have reviewed the triage vital signs and the nursing notes.  Pertinent labs & imaging results that were available during my care of the patient were reviewed by me and considered in my medical decision making (see chart for details).  Clinical Course    Patient presents after a fall. She has no head injury. No indication for further imaging studies of her head or cervical spine. She had swelling deformity to the right wrist. X-rays don't reveal any new fractures. She was placed in a thumb spica splint for comfort and support. She has some pain to her coccyx but no acute injuries are noted. There is no bony tenderness to her lumbar spine or thoracic spine. She does have some tenderness over her right mid back at the lower rib cage. No signs of external trauma are noted. X-rays of her right ribs don't reveal any acute injuries but she did have a questionable compression fracture of her thoracic spine. Dedicated thoracic films were done which show a subtle compression deformity of T12. She doesn't appear to have any point tenderness to this area. She was given  tramadol for symptomatic relief. She feels like her tetanus shot is up-to-date but she is can call her doctor tomorrow to verify this. She does not wish to get a tetanus immunization today. She was given referral to hand surgery to follow-up about her wrist. She was advised to follow-up with her primary care doctor for other symptoms are not improving.  Final Clinical Impressions(s) / ED Diagnoses   Final diagnoses:  Thoracic compression fracture, closed, initial encounter (HCC)  Wrist sprain, right, initial encounter  Back contusion, right, initial encounter    New Prescriptions New Prescriptions   TRAMADOL (ULTRAM) 50 MG TABLET    Take 1 tablet (50 mg total) by mouth every 6 (six) hours as needed.     Rolan Bucco, MD 07/03/16 1340

## 2016-07-03 NOTE — ED Triage Notes (Signed)
Pt c/o slip and fell backwards landing on cement today causing pain and injury to back, right wrist and elbow.

## 2016-07-12 IMAGING — CR DG CHEST 1V PORT
1 series · 1 of 1 positions shown · non-contrast
Comparison: 05/23/2015

CLINICAL DATA: Procedure Code CORONARY ARTERY BYPASS GRAFTING
(CABG) (N/A Chest) TRANSESOPHAGEAL ECHOCARDIOGRAM (SUAMINO) [id number] CPT
(R) Diagnosis Codes CAD

EXAM:
PORTABLE CHEST - 1 VIEW

[AP]
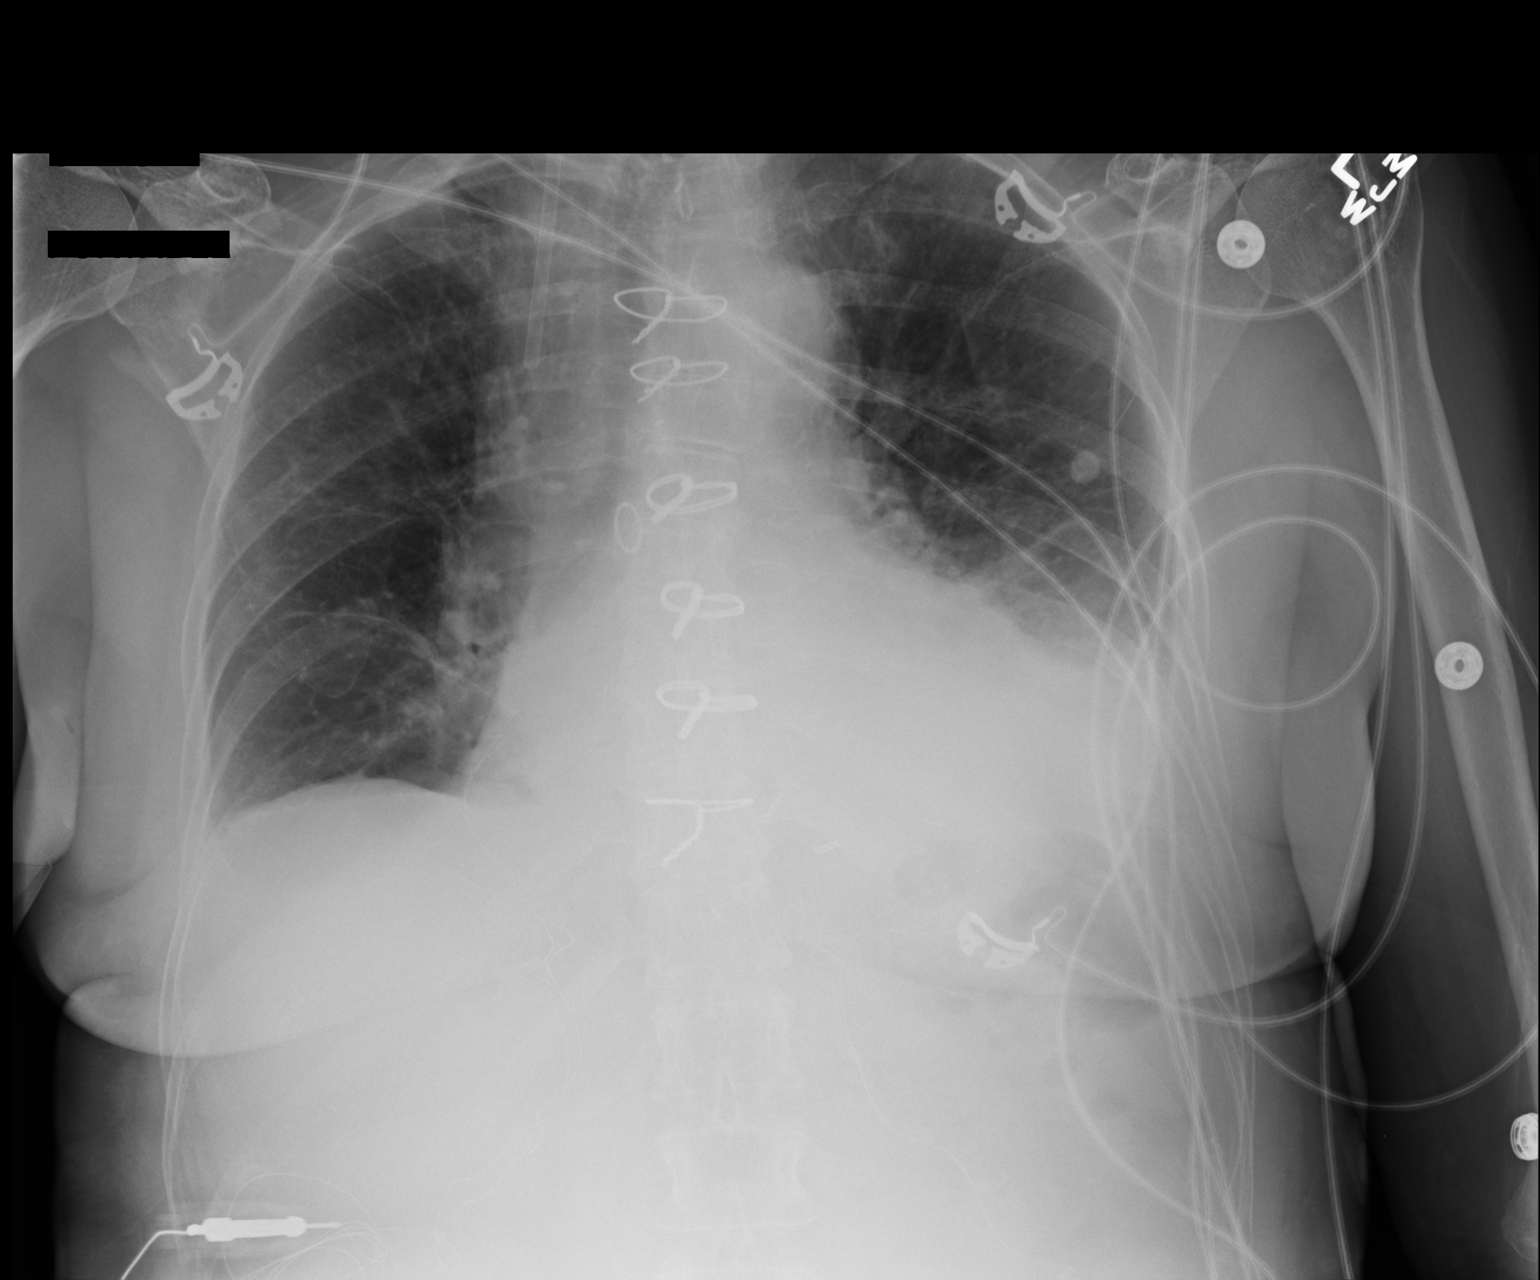

[1 of 1 positions shown; findings below may reference images not displayed]

FINDINGS: Sternotomy wires overlie normal cardiac silhouette. Interval
extubation. There is persistent left basilar atelectasis small
effusion. Interval removal of left chest tube and mediastinal drain
without apparent pneumothorax. Retraction Swan-Ganz catheter. No
pulmonary edema.
IMPRESSION: 1. Interval extubation without complication.
2. Removal left chest tube without apparent pneumothorax.
3. Persistent left basilar atelectasis and effusion.

## 2016-07-13 IMAGING — CR DG CHEST 2V
2 series · 2 of 2 positions shown · non-contrast
Comparison: 05/24/2015

CLINICAL DATA: Shortness of Breath

EXAM:
CHEST - 2 VIEW

[chest pa]
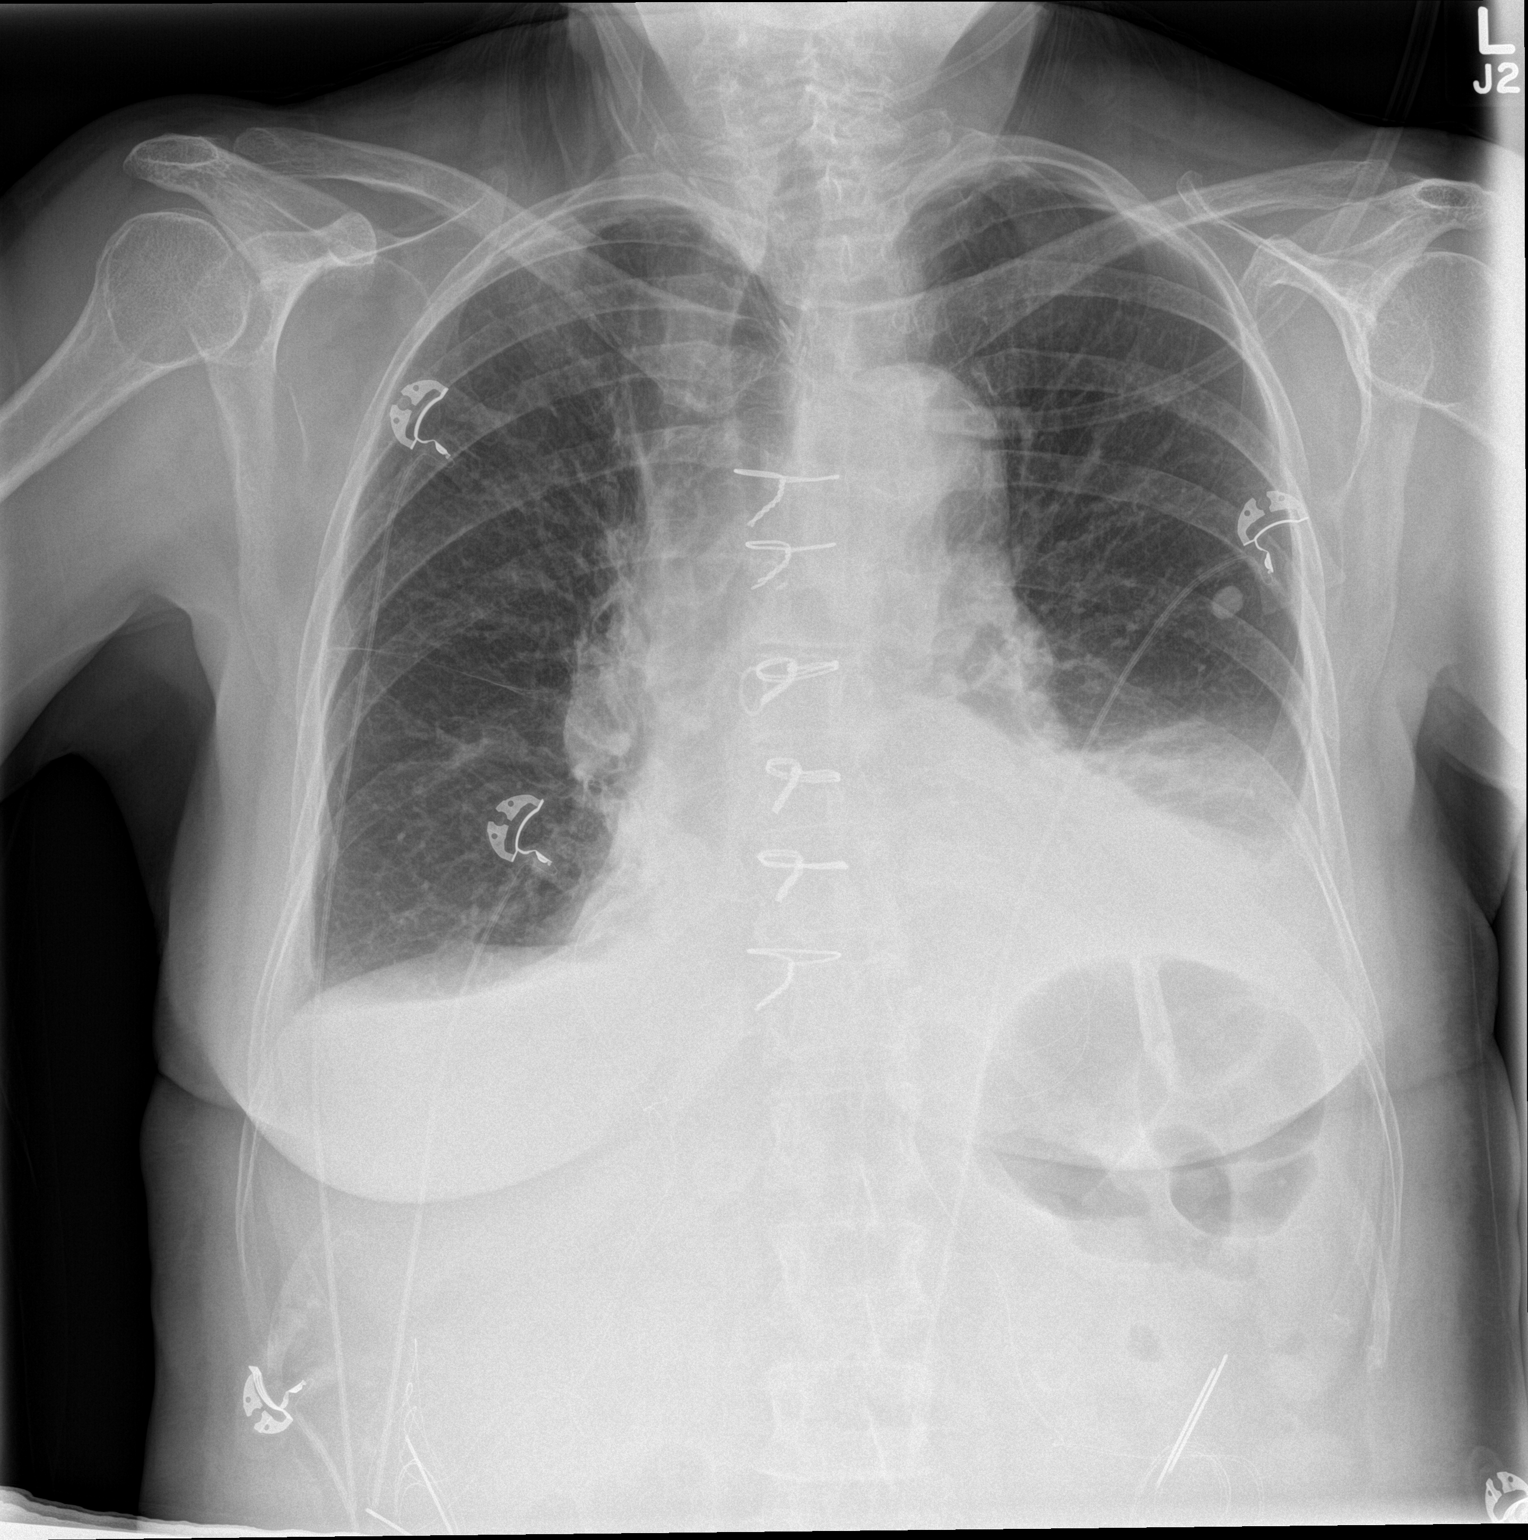

[chest lat]
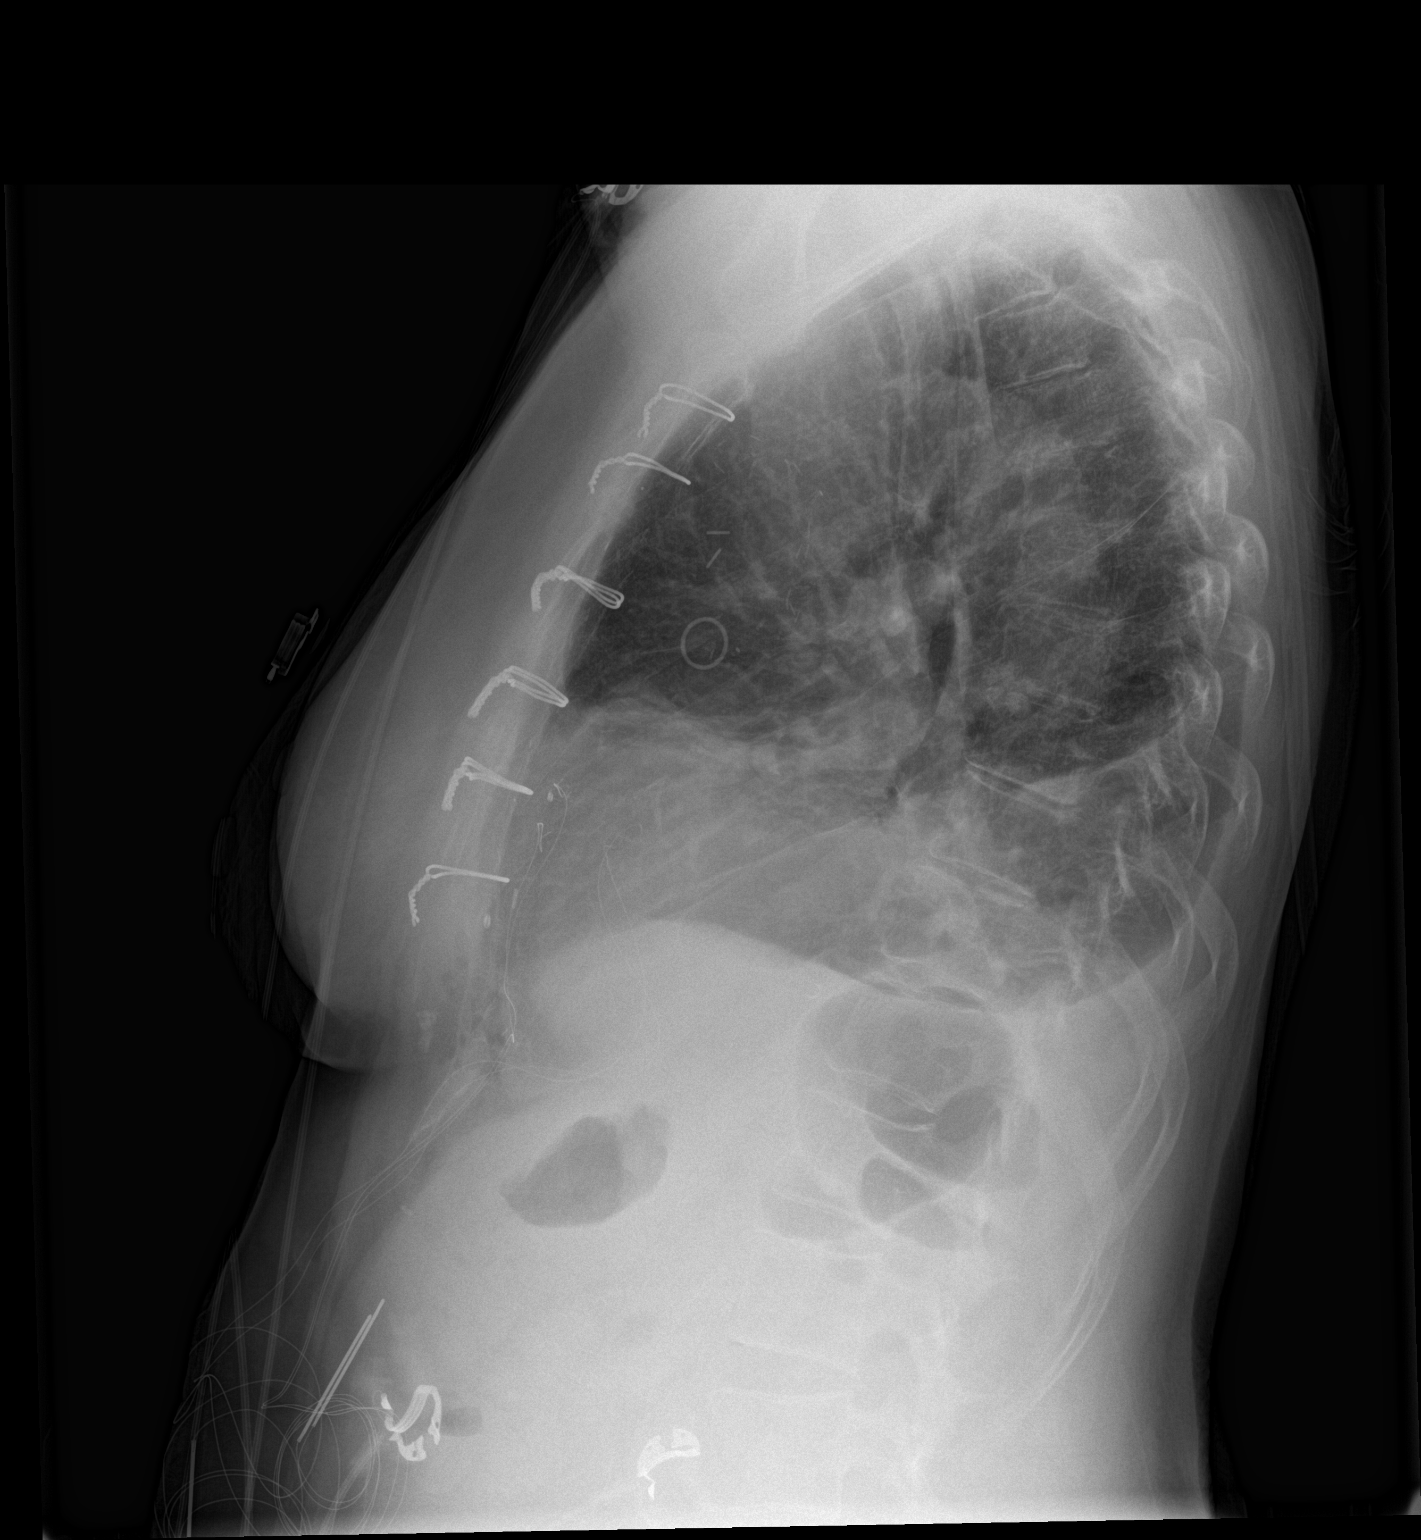

[2 of 2 positions shown; findings below may reference images not displayed]

FINDINGS: Cardiac shadow is stable. Persistent left basilar atelectasis and
effusion are seen. No pneumothorax is noted. A calcified granuloma
is again noted in the left mid lung. Small right pleural effusion is
stable.
IMPRESSION: Bilateral pleural effusions stable from the prior exam. Left basilar
atelectasis is noted.

## 2016-08-18 ENCOUNTER — Ambulatory Visit: Payer: Medicare Other | Admitting: Interventional Cardiology

## 2016-08-31 ENCOUNTER — Encounter: Payer: Self-pay | Admitting: Interventional Cardiology

## 2016-08-31 ENCOUNTER — Ambulatory Visit (INDEPENDENT_AMBULATORY_CARE_PROVIDER_SITE_OTHER): Payer: Medicare Other | Admitting: Interventional Cardiology

## 2016-08-31 VITALS — BP 130/84 | HR 70 | Ht 60.5 in | Wt 138.1 lb

## 2016-08-31 DIAGNOSIS — I10 Essential (primary) hypertension: Secondary | ICD-10-CM | POA: Diagnosis not present

## 2016-08-31 DIAGNOSIS — Z951 Presence of aortocoronary bypass graft: Secondary | ICD-10-CM

## 2016-08-31 DIAGNOSIS — I255 Ischemic cardiomyopathy: Secondary | ICD-10-CM | POA: Diagnosis not present

## 2016-08-31 DIAGNOSIS — I252 Old myocardial infarction: Secondary | ICD-10-CM

## 2016-08-31 MED ORDER — METOPROLOL TARTRATE 50 MG PO TABS: ORAL_TABLET | ORAL | 3 refills | Status: DC

## 2016-08-31 NOTE — Progress Notes (Signed)
Cardiology Office Note    Date:  08/31/2016   ID:  Dana Palmer, DOB 21-May-1941, MRN [id number]  PCP:  Willow OraANDY,CAMILLE L, MD  Cardiologist: Lesleigh NoeHenry W Cassadee Vanzandt III, MD   Chief Complaint  Patient presents with  . Coronary Artery Disease    History of Present Illness:  Dana DuverneyMarti Learn is a 75 y.o. female who presents for CAD, CABGWith LIMA to LAD and SVG to diagonal 2016, acute systolic heart failure resolved to normal greater than 60% 2016, and hyperlipidemia. No prior history of hypertension but on therapy blood pressures are upper normal.   No major complaints. Says her energy levels have decreased. We have documented an increase in LV function from 35% acutely during infarction to 60-65% post bypass surgery on beta blocker and ARB therapy. She denies angina. She has not needed nitroglycerin. She denies shortness of breath. Prolonged palpitations.     Past Medical History:  Diagnosis Date  . CAD (coronary artery disease)    a. ant-lat STEMI 6/16 >> LHC:  ostial to mid LAD 99% with complex thrombus, mid to dist LAD 99%, D2 80%, prox RCA 50%, mid RCA 50%, EF 35% with ant-apical AK >>  b. s/p CABG (L-LAD, S-Dx)  . Essential hypertension   . Gallstone   . HLD (hyperlipidemia)   . Ischemic cardiomyopathy    a. echo 6/16:  EF 35%, mild LVH, periapical HK, Gr 2 DD;  b.  Echo 7/16:  Mild LVH, mild focal basal septal hypertrophy, EF 60-65%, no RWMA, trivial AI, MAC, mild increased PASP, trivial effusion  . Pneumonia   . Psoriasis   . UTI (lower urinary tract infection)     Past Surgical History:  Procedure Laterality Date  . CARDIAC CATHETERIZATION N/A 05/22/2015   Procedure: Left Heart Cath and Coronary Angiography;  Surgeon: Lyn RecordsHenry W Landon Bassford, MD;  [facility];  Service: Cardiovascular;  Laterality: N/A;  . CARPAL TUNNEL RELEASE    . CORONARY ARTERY BYPASS GRAFT    . CORONARY ARTERY BYPASS GRAFT N/A 05/22/2015   Procedure: CORONARY ARTERY BYPASS GRAFTING (CABG);  Surgeon: Loreli SlotSteven  C Hendrickson, MD;  [facility];  Service: Open Heart Surgery;  Laterality: N/A;  . TEE WITHOUT CARDIOVERSION  05/22/2015   Procedure: TRANSESOPHAGEAL ECHOCARDIOGRAM (TEE);  Surgeon: Loreli SlotSteven C Hendrickson, MD;  [facility];  Service: Open Heart Surgery;;  . TONSILLECTOMY    . TUBAL LIGATION    . WRIST FRACTURE SURGERY Bilateral 11/26/2008   Open reduction    Current Medications: Outpatient Medications Prior to Visit  Medication Sig Dispense Refill  . clobetasol cream (TEMOVATE) 0.05 % Apply 1 application topically 2 (two) times daily as needed (psoriasis).     Marland Kitchen. losartan (COZAAR) 25 MG tablet TAKE 1 TABLET (25 MG TOTAL) BY MOUTH DAILY. 90 tablet 1  . aspirin EC 81 MG tablet Take 1 tablet (81 mg total) by mouth daily. (Patient not taking: Reported on 08/31/2016)    . metoprolol (LOPRESSOR) 50 MG tablet TAKE 1 & 1/2 TABLETS BY MOUTH TWICE DAILY (Patient not taking: Reported on 08/31/2016) 90 tablet 6  . traMADol (ULTRAM) 50 MG tablet Take 1 tablet (50 mg total) by mouth every 6 (six) hours as needed. (Patient not taking: Reported on 08/31/2016) 15 tablet 0   No facility-administered medications prior to visit.      Allergies:   Avelox [moxifloxacin]; Humira [adalimumab]; Lipitor [atorvastatin]; Ultram [tramadol hcl]; Codeine; Methotrexate derivatives; Sulfa antibiotics; Monosodium glutamate; and Sorbitol   Social History  Social History  . Marital status: Married    Spouse name: N/A  . Number of children: 3  . Years of education: N/A   Occupational History  . retired Engineer, civil (consulting)    Social History Main Topics  . Smoking status: Former Smoker    Packs/day: 0.50    Years: 50.00    Types: Cigarettes    Quit date: 05/21/2001  . Smokeless tobacco: Never Used  . Alcohol use No  . Drug use: No  . Sexual activity: Not Asked   Other Topics Concern  . None   Social History Narrative   Lives with her daughter, takes care of her own ADLs and iADLs     Family History:  The patient's  family history includes Heart attack in her paternal grandfather; Heart block in her sister; Heart disease in her father; Heart failure in her mother; Hypertension in her sister; Liver cancer in her mother; Pancreatic disease in her mother; Prostate cancer in her son; Stroke in her sister; Urolithiasis in her father.   ROS:   Please see the history of present illness.    Occasional diarrhea. Concerned about bruising. Occasional skipped heartbeats. Compression fracture 12th thoracic vertebra. Easy bruising. Diarrhea.  All other systems reviewed and are negative.   PHYSICAL EXAM:   VS:  BP 130/84   Pulse 70   Ht 5' 0.5" (1.537 m)   Wt 138 lb 1.9 oz (62.7 kg)   BMI 26.53 kg/m    GEN: Well nourished, well developed, in no acute distress  HEENT: normal  Neck: no JVD, carotid bruits, or masses Cardiac: RRR; no murmurs, rubs, or gallops,no edema  Respiratory:  clear to auscultation bilaterally, normal work of breathing GI: soft, nontender, nondistended, + BS MS: no deformity or atrophy  Skin: warm and dry, no rash Neuro:  Alert and Oriented x 3, Strength and sensation are intact Psych: euthymic mood, full affect  Wt Readings from Last 3 Encounters:  08/31/16 138 lb 1.9 oz (62.7 kg)  04/24/16 136 lb 1.6 oz (61.7 kg)  02/22/16 131 lb 8 oz (59.6 kg)      Studies/Labs Reviewed:   EKG:  EKG  Not performed.  Recent Labs: 12/02/2015: ALT 15 04/24/2016: BUN 19; Creatinine, Ser 0.88; Hemoglobin 12.6; Platelets 238; Potassium 3.7; Sodium 141   Lipid Panel    Component Value Date/Time   CHOL 129 07/20/2015 0732   TRIG 70.0 07/20/2015 0732   HDL 27.70 (L) 07/20/2015 0732   CHOLHDL 5 07/20/2015 0732   VLDL 14.0 07/20/2015 0732   LDLCALC 88 07/20/2015 0732    Additional studies/ records that were reviewed today include:   Echocardiogram in 06/08/2015 Study Conclusions   - Left ventricle: The cavity size was normal. Wall thickness was   increased in a pattern of mild LVH. There was  mild focal basal   hypertrophy of the septum. Systolic function was normal. The   estimated ejection fraction was in the range of 60% to 65%. Wall   motion was normal; there were no regional wall motion   abnormalities. Doppler parameters are consistent with abnormal   left ventricular relaxation (grade 1 diastolic dysfunction). - Aortic valve: There was trivial regurgitation. - Mitral valve: Calcified annulus. - Pulmonary arteries: Systolic pressure was mildly increased. - Pericardium, extracardiac: A trivial pericardial effusion was   identified.   Impressions:   - Normal LV function; grade 1 diastolic dysfunction; trace AI; mild   TR; midly elevated pulmonary pressure.  ASSESSMENT:    1. S/P CABG x 2   2. Essential hypertension   3. Ischemic cardiomyopathy   4.  Remote Anterior Stemi      PLAN:  In order of problems listed above:  1. No symptoms to suggest angina. Overall prognosis is great now that LV function is returning to normal. She feels is some medication is likely contributing to diarrhea. 2. Blood pressure is under good control. I will try to trim her medication regimen if possible. We will start by decreasing metoprolol to 50 mg twice a day for 2 weeks then to 25 mg twice a day. I will see her back in 3 months and if blood pressure is stable and there is no significant increase in heart rate or arrhythmia development, we will likely discontinue the beta blocker. We may need to intensify the dose of ARB therapy for blood pressure control. Hopefully fatigue and diarrhea will resolve with the medication change. 3. EF increased from 35% to 65% after revascularization. 4. No recurrence of symptom.    Medication Adjustments/Labs and Tests Ordered: Current medicines are reviewed at length with the patient today.  Concerns regarding medicines are outlined above.  Medication changes, Labs and Tests ordered today are listed in the Patient Instructions below. There  are no Patient Instructions on file for this visit.   Signed, Lesleigh Noe, MD  08/31/2016 9:45 AM    [facility] [address] [place], [place], [place]  [address] Phone: [phone]; Fax: [phone]

## 2016-08-31 NOTE — Patient Instructions (Signed)
Medication Instructions:  1) DECREASE Metoprolol Tartrate to 50mg  twice daily for two weeks and then decrease it to 25mg  twice daily.  Labwork: None  Testing/Procedures: None  Follow-Up: Your physician recommends that you schedule a follow-up appointment in: 3 months with Dr. Katrinka BlazingSmith.    Any Other Special Instructions Will Be Listed Below (If Applicable).     If you need a refill on your cardiac medications before your next appointment, please call your pharmacy.

## 2016-12-04 ENCOUNTER — Ambulatory Visit: Payer: Medicare Other | Admitting: Interventional Cardiology

## 2016-12-10 ENCOUNTER — Other Ambulatory Visit: Payer: Self-pay | Admitting: Physician Assistant

## 2017-02-11 NOTE — Progress Notes (Signed)
Cardiology Office Note    Date:  02/12/2017   ID:  Dana Palmer, DOB [birth year], MRN [id number]  PCP:  Leamon Arnt, MD  Cardiologist: Sinclair Grooms, MD   Chief Complaint  Patient presents with  . Coronary Artery Disease    History of Present Illness:  Dana Palmer is a 76 y.o. female who presents for CAD, CABG with LIMA to LAD and SVG to diagonal 2111, acute systolic heart failure resolved to normal EF greater than 60% 2016, hypertension, and hyperlipidemia.   Feels well. No dyspnea or chest discomfort. Beta blocker side effects are markedly improved. Also states that she feels lethargic when she takes losartan. She denies palpitations, syncope, chest pain, shortness of breath, and edema.  Past Medical History:  Diagnosis Date  . CAD (coronary artery disease)    a. ant-lat STEMI 6/16 >> LHC:  ostial to mid LAD 99% with complex thrombus, mid to dist LAD 99%, D2 80%, prox RCA 50%, mid RCA 50%, EF 35% with ant-apical AK >>  b. s/p CABG (L-LAD, S-Dx)  . Essential hypertension   . Gallstone   . HLD (hyperlipidemia)   . Ischemic cardiomyopathy    a. echo 6/16:  EF 35%, mild LVH, periapical HK, Gr 2 DD;  b.  Echo 7/16:  Mild LVH, mild focal basal septal hypertrophy, EF 60-65%, no RWMA, trivial AI, MAC, mild increased PASP, trivial effusion  . Pneumonia   . Psoriasis   . UTI (lower urinary tract infection)     Past Surgical History:  Procedure Laterality Date  . CARDIAC CATHETERIZATION N/A 05/22/2015   Procedure: Left Heart Cath and Coronary Angiography;  Surgeon: Belva Crome, MD;  Location: [facility];  Service: Cardiovascular;  Laterality: N/A;  . CARPAL TUNNEL RELEASE    . CORONARY ARTERY BYPASS GRAFT    . CORONARY ARTERY BYPASS GRAFT N/A 05/22/2015   Procedure: CORONARY ARTERY BYPASS GRAFTING (CABG);  Surgeon: Melrose Nakayama, MD;  Location: [facility];  Service: Open Heart Surgery;  Laterality: N/A;  . TEE WITHOUT CARDIOVERSION  05/22/2015   Procedure:  TRANSESOPHAGEAL ECHOCARDIOGRAM (TEE);  Surgeon: Melrose Nakayama, MD;  Location: [facility];  Service: Open Heart Surgery;;  . TONSILLECTOMY    . TUBAL LIGATION    . WRIST FRACTURE SURGERY Bilateral 11/26/2008   Open reduction    Current Medications: Outpatient Medications Prior to Visit  Medication Sig Dispense Refill  . aspirin EC 81 MG tablet Take 81 mg by mouth at bedtime.    . clobetasol cream (TEMOVATE) 5.52 % Apply 1 application topically 2 (two) times daily as needed (psoriasis).     Marland Kitchen losartan (COZAAR) 25 MG tablet TAKE 1 TABLET (25 MG TOTAL) BY MOUTH DAILY. 90 tablet 2  . metoprolol (LOPRESSOR) 50 MG tablet Take 57m twice daily for two weeks and then decrease to 265mtwice daily. (Patient not taking: Reported on 02/12/2017) 180 tablet 3   No facility-administered medications prior to visit.      Allergies:   Avelox [moxifloxacin]; Humira [adalimumab]; Lipitor [atorvastatin]; Ultram [tramadol hcl]; Codeine; Methotrexate derivatives; Sulfa antibiotics; Monosodium glutamate; and Sorbitol   Social History   Social History  . Marital status: Married    Spouse name: N/A  . Number of children: 3  . Years of education: N/A   Occupational History  . 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 Smoking status: Former Smoker    Packs/day: 0.50    Years: 50.00  Types: Cigarettes    Quit date: 05/21/2001  . Smokeless tobacco: Never Used  . Alcohol use No  . Drug use: No  . Sexual activity: Not Asked   Other Topics Concern  . None   Social History Narrative   Lives with her daughter, takes care of her own ADLs and iADLs     Family History:  The patient's family history includes Heart attack in her paternal grandfather; Heart block in her sister; Heart disease in her father; Heart failure in her mother; Hypertension in her sister; Liver cancer in her mother; Pancreatic disease in her mother; Prostate cancer in her son; Stroke in her sister; Urolithiasis in her father.    ROS:   Please see the history of present illness.    none  All other systems reviewed and are negative.   PHYSICAL EXAM:   VS:  BP 136/80 (BP Location: Right Arm)   Pulse 77   Ht _0  (1.575 m)   Wt 138 lb (62.6 kg)   BMI 25.24 kg/m    GEN: Well nourished, well developed, in no acute distress  HEENT: normal  Neck: no JVD, carotid bruits, or masses Cardiac: RRR; no murmurs, rubs, or gallops,no edema  Respiratory:  clear to auscultation bilaterally, normal work of breathing GI: soft, nontender, nondistended, + BS MS: no deformity or atrophy  Skin: warm and dry, no rash Neuro:  Alert and Oriented x 3, Strength and sensation are intact Psych: euthymic mood, full affect  Wt Readings from Last 3 Encounters:  02/12/17 138 lb (62.6 kg)  08/31/16 138 lb 1.9 oz (62.7 kg)  04/24/16 136 lb 1.6 oz (61.7 kg)      Studies/Labs Reviewed:   EKG:  EKG  Normal sinus rhythm with normal overall appearance.  Recent Labs: 04/24/2016: BUN 19; Creatinine, Ser 0.88; Hemoglobin 12.6; Platelets 238; Potassium 3.7; Sodium 141   Lipid Panel    Component Value Date/Time   CHOL 129 07/20/2015 0732   TRIG 70.0 07/20/2015 0732   HDL 27.70 (L) 07/20/2015 0732   CHOLHDL 5 07/20/2015 0732   VLDL 14.0 07/20/2015 0732   LDLCALC 88 07/20/2015 0732    Additional studies/ records that were reviewed today include:  None    ASSESSMENT:    1. 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   2. Essential hypertension   3. Other hyperlipidemia   4.  Remote Anterior Stemi   5. Chronic diastolic heart failure (HCC)      PLAN:  In order of problems listed above:  1. Stable without complaints compatible with angina. Continue active lifestyle. Notify us if complaints. 2. Target blood pressure less than 140/90 mmHg. Not sure we can safely discontinue beta blocker therapy without concomitant increase in BP. She feels better on lower dose metoprolol. Fecal incontinence and fatigue improved.  We'll plan to decrease metoprolol to 25 mg once daily and after 7-10 days discontinue this therapy. Increase losartan to 50 mg daily. Blood pressure clinic in one month with basic metabolic panel. 3. LDL target less than 70. 4. Prior documentation demonstrates an LV function has returned to normal. Because of prior presentation and some anterior scar, she now should be rifling classified as chronic diastolic heart failure. 5. No evidence of volume overload. If this ever becomes an issue, add low-dose HCTZ to losartan   Clinical follow-up with Dr. Tamala Julian in one year. We'll see earlier than one year if blood pressure is not controlled with above adjustments. Medication Adjustments/Labs and  Tests Ordered: Current medicines are reviewed at length with the patient today.  Concerns regarding medicines are outlined above.  Medication changes, Labs and Tests ordered today are listed in the Patient Instructions below. Patient Instructions  Medication Instructions:  1) DECREASE Metoprolol to 81m once daily for 7-10 days and then discontinue. 2) INCREASE Losartan to 543monce daily.  Labwork: Your physician recommends that you return for lab work at time of your Hypertension Clinic appointment (BMET)   Testing/Procedures: None  Follow-Up: Your physician recommends that you schedule a follow-up appointment in: 1 month with our Hypertension Clinic.   Your physician wants you to follow-up in: 1 year with Dr. SmTamala JulianYou will receive a reminder letter in the mail two months in advance. If you don't receive a letter, please call our office to schedule the follow-up appointment.    Any Other Special Instructions Will Be Listed Below (If Applicable).     If you need a refill on your cardiac medications before your next appointment, please call your pharmacy.      Signed, HeSinclair GroomsMD  02/12/2017 2:06 PM    [facility] [address]   [id number]: [phone]: [phone]

## 2017-02-12 ENCOUNTER — Ambulatory Visit (INDEPENDENT_AMBULATORY_CARE_PROVIDER_SITE_OTHER): Payer: Medicare Other | Admitting: Interventional Cardiology

## 2017-02-12 ENCOUNTER — Encounter: Payer: Self-pay | Admitting: Interventional Cardiology

## 2017-02-12 VITALS — BP 136/80 | HR 77 | Ht 62.0 in | Wt 138.0 lb

## 2017-02-12 DIAGNOSIS — I252 Old myocardial infarction: Secondary | ICD-10-CM

## 2017-02-12 DIAGNOSIS — I5032 Chronic diastolic (congestive) heart failure: Secondary | ICD-10-CM | POA: Diagnosis not present

## 2017-02-12 DIAGNOSIS — I25810 Atherosclerosis of coronary artery bypass graft(s) without angina pectoris: Secondary | ICD-10-CM

## 2017-02-12 DIAGNOSIS — I10 Essential (primary) hypertension: Secondary | ICD-10-CM

## 2017-02-12 DIAGNOSIS — E7849 Other hyperlipidemia: Secondary | ICD-10-CM

## 2017-02-12 DIAGNOSIS — E784 Other hyperlipidemia: Secondary | ICD-10-CM | POA: Diagnosis not present

## 2017-02-12 HISTORY — DX: Chronic diastolic (congestive) heart failure: I50.32

## 2017-02-12 MED ORDER — LOSARTAN POTASSIUM 50 MG PO TABS
50.0000 mg | ORAL_TABLET | Freq: Every day | ORAL | 3 refills | Status: DC
Start: 2017-02-12 — End: 2018-03-12

## 2017-02-12 NOTE — Patient Instructions (Signed)
Medication Instructions:  1) DECREASE Metoprolol to 25mg  once daily for 7-10 days and then discontinue. 2) INCREASE Losartan to 50mg  once daily.  Labwork: Your physician recommends that you return for lab work at time of your Hypertension Clinic appointment (BMET)   Testing/Procedures: None  Follow-Up: Your physician recommends that you schedule a follow-up appointment in: 1 month with our Hypertension Clinic.   Your physician wants you to follow-up in: 1 year with Dr. Katrinka BlazingSmith. You will receive a reminder letter in the mail two months in advance. If you don't receive a letter, please call our office to schedule the follow-up appointment.    Any Other Special Instructions Will Be Listed Below (If Applicable).     If you need a refill on your cardiac medications before your next appointment, please call your pharmacy.

## 2017-03-19 ENCOUNTER — Ambulatory Visit (INDEPENDENT_AMBULATORY_CARE_PROVIDER_SITE_OTHER): Payer: Medicare Other | Admitting: Pharmacist

## 2017-03-19 ENCOUNTER — Other Ambulatory Visit: Payer: Self-pay | Admitting: Interventional Cardiology

## 2017-03-19 ENCOUNTER — Other Ambulatory Visit: Payer: Medicare Other

## 2017-03-19 VITALS — BP 132/70 | HR 89

## 2017-03-19 DIAGNOSIS — E785 Hyperlipidemia, unspecified: Secondary | ICD-10-CM | POA: Diagnosis not present

## 2017-03-19 DIAGNOSIS — I10 Essential (primary) hypertension: Secondary | ICD-10-CM

## 2017-03-19 NOTE — Progress Notes (Signed)
Patient ID: Dana Palmer                 DOB: 08-Apr-1941                      MRN: [id number]     HPI: Dana Palmer is a 76 y.o. female referred by Dr. Tamala Julian to HTN clinic. PMH is significant for CAD s/p CABG with LIMA to LAD and SVG to diagonal in 2016, HTN, and HLD. Pt was seen in clinic 1 month ago. She was experiencing side effects of beta blocker therapy (fecal incontinence and fatigue). She was advised to discontinue metoprolol and increase her losartan to 43m daily. She presents today for BP follow up and BMET.  Pt reports feeling much better since discontinuing her metoprolol. She reports adherence with losartan and took her dose this morning. Denies dizziness, blurred vision, headache, or falls. She limits sodium in her diet but does drink coffee throughout the day. She checks her BP at home a few times a week. Most of her systolic readings run in the 120s. The highest she has seen them is around 140.  She would like uKoreato check her cholesterol and LFTs today as well.  Current HTN meds: losartan 521mdaily Previously tried: metoprolol - fecal incontinence and fatigue BP goal: <140/9064m  Family History: The patient's family history includes Heart attack in her paternal grandfather; Heart block in her sister; Heart disease in her father; Heart failure in her mother; Hypertension in her sister; Liver cancer in her mother; Pancreatic disease in her mother; Prostate cancer in her son; Stroke in her sister; Urolithiasis in her father.   Social History: Former smoker 1/2 PPD for 50 years, quit in 2002. Denies alcohol and illicit drug use. Retired nurMarine scientistDiet: Granola or cereal for breakfast, occasionally has eggs. Salad, peanut butter, or yogurt with fruit/nuts for lunch. Dinner - chicken, fish, beef, or pork - lean cuts, does not fry food. Cooks most food at home. Does not cook with salt. Drinks a few cups of coffee each day.   Exercise: Stays active with housework and playing with her  grandkids.  Home BP readings:   Wt Readings from Last 3 Encounters:  02/12/17 138 lb (62.6 kg)  08/31/16 138 lb 1.9 oz (62.7 kg)  04/24/16 136 lb 1.6 oz (61.7 kg)   BP Readings from Last 3 Encounters:  02/12/17 136/80  08/31/16 130/84  07/03/16 161/95   Pulse Readings from Last 3 Encounters:  02/12/17 77  08/31/16 70  07/03/16 76    Renal function: CrCl cannot be calculated (Patient's most recent lab result is older than the maximum 21 days allowed.).  Past Medical History:  Diagnosis Date  . CAD (coronary artery disease)    a. ant-lat STEMI 6/16 >> LHC:  ostial to mid LAD 99% with complex thrombus, mid to dist LAD 99%, D2 80%, prox RCA 50%, mid RCA 50%, EF 35% with ant-apical AK >>  b. s/p CABG (L-LAD, S-Dx)  . Essential hypertension   . Gallstone   . HLD (hyperlipidemia)   . Ischemic cardiomyopathy    a. echo 6/16:  EF 35%, mild LVH, periapical HK, Gr 2 DD;  b.  Echo 7/16:  Mild LVH, mild focal basal septal hypertrophy, EF 60-65%, no RWMA, trivial AI, MAC, mild increased PASP, trivial effusion  . Pneumonia   . Psoriasis   . UTI (lower urinary tract infection)     Current Outpatient Prescriptions on File  Prior to Visit  Medication Sig Dispense Refill  . aspirin EC 81 MG tablet Take 81 mg by mouth at bedtime.    . clobetasol cream (TEMOVATE) 4.62 % Apply 1 application topically 2 (two) times daily as needed (psoriasis).     Marland Kitchen losartan (COZAAR) 50 MG tablet Take 1 tablet (50 mg total) by mouth daily. 90 tablet 3   No current facility-administered medications on file prior to visit.     Allergies  Allergen Reactions  . Avelox [Moxifloxacin] Other (See Comments)    Irregular heart rate, mucoid stools, SOB  . Humira [Adalimumab] Other (See Comments)    Stopped working/ possible cardiac reaction  . Lipitor [Atorvastatin] Other (See Comments)    Turned urine brown - abnormal labs  . Ultram [Tramadol Hcl] Nausea Only  . Codeine Nausea And Vomiting and Rash  .  Methotrexate Derivatives Nausea And Vomiting  . Sulfa Antibiotics Nausea Only and Rash  . Monosodium Glutamate Other (See Comments)    headache  . Sorbitol Diarrhea     Assessment/Plan:  1. Hypertension - BP at goal < 140/74mHg and patient is feeling much better since discontinuing metoprolol. Will continue losartan 532mdaily, checking BMET today with recent dose increase. Advised pt to call clinic with any BP related concerns, she will continue to monitor at home.  2. Hyperlipidemia - Pt requested a lipid panel check today. Added a lipid panel, direct LDL since pt is not fasting, and LFTs given previous history of elevated LFTs. She is currently not taking any medication for her cholesterol and reports that her LFTs were very high when she previously took Lipitor. LDL goal < 70 given hx of ASCVD. May be a candidate for PCSK9i pending lab results.   Dana Palmer E. Catalia Massett, PharmD, CPP, [id number]. [address]Gr[place]aca[place] [id number]: [phone]: [phone]/23/2018 2:53 PM

## 2017-03-19 NOTE — Patient Instructions (Signed)
Continue taking losartan  daily for your blood pressure and call clinic with any concerns

## 2017-03-20 LAB — HEPATIC FUNCTION PANEL
ALBUMIN: 4 g/dL (ref 3.5–4.8)
ALK PHOS: 78 IU/L (ref 39–117)
ALT: 17 IU/L (ref 0–32)
AST: 20 IU/L (ref 0–40)
BILIRUBIN, DIRECT: 0.08 mg/dL (ref 0.00–0.40)
Bilirubin Total: 0.3 mg/dL (ref 0.0–1.2)
TOTAL PROTEIN: 7 g/dL (ref 6.0–8.5)

## 2017-03-20 LAB — BASIC METABOLIC PANEL
BUN / CREAT RATIO: 21 (ref 12–28)
BUN: 16 mg/dL (ref 8–27)
CALCIUM: 9.2 mg/dL (ref 8.7–10.3)
CHLORIDE: 100 mmol/L (ref 96–106)
CO2: 27 mmol/L (ref 18–29)
Creatinine, Ser: 0.77 mg/dL (ref 0.57–1.00)
GFR calc non Af Amer: 76 mL/min/{1.73_m2} (ref >59)
GFR, EST AFRICAN AMERICAN: 87 mL/min/{1.73_m2} (ref >59)
GLUCOSE: 110 mg/dL — AB (ref 65–99)
Potassium: 4.3 mmol/L (ref 3.5–5.2)
Sodium: 140 mmol/L (ref 134–144)

## 2017-03-20 LAB — LIPID PANEL
CHOLESTEROL TOTAL: 222 mg/dL — AB (ref 100–199)
Chol/HDL Ratio: 3 ratio (ref 0.0–4.4)
HDL: 75 mg/dL (ref >39)
LDL Calculated: 113 mg/dL — ABNORMAL HIGH (ref 0–99)
Triglycerides: 172 mg/dL — ABNORMAL HIGH (ref 0–149)
VLDL Cholesterol Cal: 34 mg/dL (ref 5–40)

## 2017-03-20 LAB — LDL CHOLESTEROL, DIRECT: LDL Direct: 124 mg/dL — ABNORMAL HIGH (ref 0–99)

## 2017-03-22 ENCOUNTER — Telehealth: Payer: Self-pay | Admitting: Pharmacist

## 2017-03-22 MED ORDER — EZETIMIBE 10 MG PO TABS: 10.0000 mg | ORAL_TABLET | Freq: Every day | ORAL | 11 refills | Status: DC

## 2017-03-22 NOTE — Telephone Encounter (Signed)
BMET, lipid panel, and LFTs drawn after pharmacist visit on 4/23. Issue with Epic resulting labs, results will be scanned in.  BMET stable since increasing losartan to '50mg'$  daily, pt aware to continue.  LDL 113 above goal < 70 due to hx of CAD s/p CABG. Pt previously had elevated LFTs on Lipitor '40mg'$  daily in 2016 - ALT increased to 186 and alk phos increased to 900. However, pt had an abdominal scan which revealed a gallstone. She has not tried statin therapy since. Discussed with Dr Tamala Julian and he is ok with rechallenging with lipid lowering therapy with close monitoring. Presented pt with option of starting Crestor '5mg'$  daily but she is very hesitant to take another statin. She is agreeable to trying Zetia '10mg'$  daily. Rx sent in. Will recheck LFTs in 2 weeks (pt going out of town after that for a few weeks).

## 2017-03-27 ENCOUNTER — Emergency Department (HOSPITAL_COMMUNITY): Payer: Medicare Other

## 2017-03-27 ENCOUNTER — Emergency Department (HOSPITAL_COMMUNITY)
Admission: EM | Admit: 2017-03-27 | Discharge: 2017-03-28 | Disposition: A | Discharge status: Home / Self Care | Payer: Medicare Other | Attending: Emergency Medicine | Admitting: Emergency Medicine

## 2017-03-27 ENCOUNTER — Encounter (HOSPITAL_COMMUNITY): Payer: Self-pay | Admitting: Emergency Medicine

## 2017-03-27 DIAGNOSIS — Z87891 Personal history of nicotine dependence: Secondary | ICD-10-CM | POA: Insufficient documentation

## 2017-03-27 DIAGNOSIS — Z951 Presence of aortocoronary bypass graft: Secondary | ICD-10-CM | POA: Diagnosis not present

## 2017-03-27 DIAGNOSIS — R079 Chest pain, unspecified: Secondary | ICD-10-CM | POA: Insufficient documentation

## 2017-03-27 DIAGNOSIS — Z7982 Long term (current) use of aspirin: Secondary | ICD-10-CM | POA: Diagnosis not present

## 2017-03-27 DIAGNOSIS — M546 Pain in thoracic spine: Secondary | ICD-10-CM | POA: Diagnosis not present

## 2017-03-27 DIAGNOSIS — I2510 Atherosclerotic heart disease of native coronary artery without angina pectoris: Secondary | ICD-10-CM | POA: Diagnosis not present

## 2017-03-27 DIAGNOSIS — I5032 Chronic diastolic (congestive) heart failure: Secondary | ICD-10-CM | POA: Diagnosis not present

## 2017-03-27 DIAGNOSIS — I110 Hypertensive heart disease with heart failure: Secondary | ICD-10-CM | POA: Diagnosis not present

## 2017-03-27 LAB — I-STAT CHEM 8, ED
BUN: 17 mg/dL (ref 6–20)
CALCIUM ION: 1.05 mmol/L — AB (ref 1.15–1.40)
CHLORIDE: 106 mmol/L (ref 101–111)
Creatinine, Ser: 0.6 mg/dL (ref 0.44–1.00)
GLUCOSE: 99 mg/dL (ref 65–99)
HCT: 37 % (ref 36.0–46.0)
Hemoglobin: 12.6 g/dL (ref 12.0–15.0)
Potassium: 4.4 mmol/L (ref 3.5–5.1)
SODIUM: 138 mmol/L (ref 135–145)
TCO2: 30 mmol/L (ref 0–100)

## 2017-03-27 LAB — I-STAT TROPONIN, ED
TROPONIN I, POC: 0 ng/mL (ref 0.00–0.08)
Troponin i, poc: 0 ng/mL (ref 0.00–0.08)

## 2017-03-27 LAB — CBC WITH DIFFERENTIAL/PLATELET
Basophils Absolute: 0 10*3/uL (ref 0.0–0.1)
Basophils Relative: 0 %
EOS ABS: 0.1 10*3/uL (ref 0.0–0.7)
Eosinophils Relative: 1 %
HEMATOCRIT: 40.8 % (ref 36.0–46.0)
HEMOGLOBIN: 12.9 g/dL (ref 12.0–15.0)
LYMPHS ABS: 1.5 10*3/uL (ref 0.7–4.0)
Lymphocytes Relative: 26 %
MCH: 26.6 pg (ref 26.0–34.0)
MCHC: 31.6 g/dL (ref 30.0–36.0)
MCV: 84.1 fL (ref 78.0–100.0)
Monocytes Absolute: 0.5 10*3/uL (ref 0.1–1.0)
Monocytes Relative: 8 %
NEUTROS ABS: 3.8 10*3/uL (ref 1.7–7.7)
NEUTROS PCT: 65 %
Platelets: 245 10*3/uL (ref 150–400)
RBC: 4.85 MIL/uL (ref 3.87–5.11)
RDW: 12.8 % (ref 11.5–15.5)
WBC: 5.8 10*3/uL (ref 4.0–10.5)

## 2017-03-27 LAB — COMPREHENSIVE METABOLIC PANEL
ALBUMIN: 3.7 g/dL (ref 3.5–5.0)
ALK PHOS: 72 U/L (ref 38–126)
ALT: 22 U/L (ref 14–54)
AST: 33 U/L (ref 15–41)
Anion gap: 9 (ref 5–15)
BUN: 13 mg/dL (ref 6–20)
CALCIUM: 9.1 mg/dL (ref 8.9–10.3)
CHLORIDE: 104 mmol/L (ref 101–111)
CO2: 26 mmol/L (ref 22–32)
CREATININE: 0.79 mg/dL (ref 0.44–1.00)
GFR calc Af Amer: >60 mL/min (ref >60)
GFR calc non Af Amer: >60 mL/min (ref >60)
GLUCOSE: 104 mg/dL — AB (ref 65–99)
Potassium: 4 mmol/L (ref 3.5–5.1)
SODIUM: 139 mmol/L (ref 135–145)
Total Bilirubin: 0.5 mg/dL (ref 0.3–1.2)
Total Protein: 7.3 g/dL (ref 6.5–8.1)

## 2017-03-27 LAB — LIPID PANEL
CHOL/HDL RATIO: 2.8 ratio
CHOLESTEROL: 221 mg/dL — AB (ref 0–200)
HDL: 79 mg/dL (ref >40)
LDL CALC: 130 mg/dL — AB (ref 0–99)
Triglycerides: 62 mg/dL (ref <150)
VLDL: 12 mg/dL (ref 0–40)

## 2017-03-27 LAB — TROPONIN I: Troponin I: <0.03 ng/mL (ref <0.03)

## 2017-03-27 LAB — PROTIME-INR
INR: 1
PROTHROMBIN TIME: 13.2 s (ref 11.4–15.2)

## 2017-03-27 LAB — APTT: aPTT: 30 seconds (ref 24–36)

## 2017-03-27 MED ORDER — HEPARIN SODIUM (PORCINE) 5000 UNIT/ML IJ SOLN
60.0000 [IU]/kg | Freq: Once | INTRAMUSCULAR | Status: AC
  Administered 2017-03-27: 3550 [IU] via INTRAVENOUS
  Filled 2017-03-27: qty 1

## 2017-03-27 MED ORDER — ONDANSETRON HCL 4 MG/2ML IJ SOLN
4.0000 mg | Freq: Once | INTRAMUSCULAR | Status: DC
  Filled 2017-03-27: qty 2

## 2017-03-27 MED ORDER — IOPAMIDOL (ISOVUE-370) INJECTION 76%
INTRAVENOUS | Status: AC
  Administered 2017-03-27: 100 mL
  Filled 2017-03-27: qty 100

## 2017-03-27 MED ORDER — NITROGLYCERIN 0.4 MG SL SUBL
0.4000 mg | SUBLINGUAL_TABLET | SUBLINGUAL | Status: DC | PRN
  Administered 2017-03-27 (×2): 0.4 mg via SUBLINGUAL
  Filled 2017-03-27: qty 1

## 2017-03-27 MED ORDER — HYDROMORPHONE HCL 1 MG/ML IJ SOLN
0.5000 mg | Freq: Once | INTRAMUSCULAR | Status: AC
  Administered 2017-03-27: 0.5 mg via INTRAVENOUS
  Filled 2017-03-27: qty 1

## 2017-03-27 MED ORDER — ASPIRIN 81 MG PO CHEW
324.0000 mg | CHEWABLE_TABLET | Freq: Once | ORAL | Status: AC
  Administered 2017-03-27: 324 mg via ORAL
  Filled 2017-03-27: qty 4

## 2017-03-27 MED ORDER — MORPHINE SULFATE (PF) 4 MG/ML IV SOLN
2.0000 mg | Freq: Once | INTRAVENOUS | Status: AC
  Administered 2017-03-27: 2 mg via INTRAVENOUS
  Filled 2017-03-27: qty 1

## 2017-03-27 MED ORDER — ONDANSETRON HCL 4 MG/2ML IJ SOLN
4.0000 mg | Freq: Once | INTRAMUSCULAR | Status: AC
  Administered 2017-03-27: 4 mg via INTRAVENOUS
  Filled 2017-03-27: qty 2

## 2017-03-27 NOTE — ED Provider Notes (Signed)
[facility] DEPT Provider Note   CSN: [id number] Arrival date & time: 03/27/17  1936     History   Chief Complaint Chief Complaint  Patient presents with  . Chest Pain    patient has had prior MI couple years ago with CABG    HPI Dana Palmer is a 76 y.o. female.  76 yo F with a chief complaint of left-sided back pain. This radiates down her shoulder and down the left arm. She had similar pain in that left arm when she had had an MI in the past required bypass surgery. She denies chest pain shortness of breath diaphoresis. She is having some nausea but denies vomiting. Denies lower extremity edema. Denies exertional symptoms. Initially started after she had mowed the lawn. She thought she had pulled a muscle and ibuprofen without improvement. Will give this morning with significant worsening of her pain.    The history is provided by the patient.  Chest Pain   Associated symptoms include back pain and nausea. Pertinent negatives include no dizziness, no fever, no headaches, no palpitations, no shortness of breath and no vomiting.  Illness  This is a new problem. The current episode started 2 days ago. The problem occurs constantly. The problem has been gradually worsening. Pertinent negatives include no chest pain, no headaches and no shortness of breath. Nothing aggravates the symptoms. Nothing relieves the symptoms. She has tried nothing for the symptoms. The treatment provided no relief.    Past Medical History:  Diagnosis Date  . CAD (coronary artery disease)    a. ant-lat STEMI 6/16 >> LHC:  ostial to mid LAD 99% with complex thrombus, mid to dist LAD 99%, D2 80%, prox RCA 50%, mid RCA 50%, EF 35% with ant-apical AK >>  b. s/p CABG (L-LAD, S-Dx)  . Essential hypertension   . Gallstone   . HLD (hyperlipidemia)   . Ischemic cardiomyopathy    a. echo 6/16:  EF 35%, mild LVH, periapical HK, Gr 2 DD;  b.  Echo 7/16:  Mild LVH, mild focal basal septal hypertrophy, EF 60-65%, no  RWMA, trivial AI, MAC, mild increased PASP, trivial effusion  . Pneumonia   . Psoriasis   . UTI (lower urinary tract infection)     Patient Active Problem List   Diagnosis Date Noted  . Chronic diastolic heart failure ([place]) 02/12/2017  . Elevated LFTs 08/26/2015  . 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 Essential hypertension 06/07/2015  . Hyperlipidemia 06/07/2015  .  Remote Anterior Stemi 05/22/2015  . Psoriasis 05/22/2015    Past Surgical History:  Procedure Laterality Date  . CARDIAC CATHETERIZATION N/A 05/22/2015   Procedure: Left Heart Cath and Coronary Angiography;  Surgeon: Belva Crome, MD;  Location: [facility];  Service: Cardiovascular;  Laterality: N/A;  . CARPAL TUNNEL RELEASE    . CORONARY ARTERY BYPASS GRAFT    . CORONARY ARTERY BYPASS GRAFT N/A 05/22/2015   Procedure: CORONARY ARTERY BYPASS GRAFTING (CABG);  Surgeon: Melrose Nakayama, MD;  Location: [facility];  Service: Open Heart Surgery;  Laterality: N/A;  . TEE WITHOUT CARDIOVERSION  05/22/2015   Procedure: TRANSESOPHAGEAL ECHOCARDIOGRAM (TEE);  Surgeon: Melrose Nakayama, MD;  Location: [facility];  Service: Open Heart Surgery;;  . TONSILLECTOMY    . TUBAL LIGATION    . WRIST FRACTURE SURGERY Bilateral 11/26/2008   Open reduction    OB History  No data available       Home Medications    Prior to Admission medications   Medication Sig Start Date End Date Taking? Authorizing Provider  aspirin EC 81 MG tablet Take 81 mg by mouth at bedtime.    Historical Provider, MD  clobetasol cream (TEMOVATE) 0.62 % Apply 1 application topically 2 (two) times daily as needed (psoriasis).     Historical Provider, MD  ezetimibe (ZETIA) 10 MG tablet Take 1 tablet (10 mg total) by mouth daily. 03/22/17 06/20/17  Belva Crome, MD  losartan (COZAAR) 50 MG tablet Take 1 tablet (50 mg total) by mouth daily. 02/12/17  05/13/17  Belva Crome, MD  morphine (MSIR) 15 MG tablet Take 1 tablet (15 mg total) by mouth every 4 (four) hours as needed for severe pain. 03/28/17   Deno Etienne, DO    Family History Family History  Problem Relation Age of Onset  . Heart disease Father   . Urolithiasis Father   . Pancreatic disease Mother   . Heart failure Mother   . Liver cancer Mother   . Heart attack Paternal Grandfather   . Heart block Sister   . Hypertension Sister   . Stroke Sister   . Prostate cancer Son     Social History Social History  Substance Use Topics  . Smoking status: Former Smoker    Packs/day: 0.50    Years: 50.00    Types: Cigarettes    Quit date: 05/21/2001  . Smokeless tobacco: Never Used  . Alcohol use No     Allergies   Avelox [moxifloxacin]; Humira [adalimumab]; Lipitor [atorvastatin]; Ultram [tramadol hcl]; Codeine; Methotrexate derivatives; Sulfa antibiotics; Statins; Monosodium glutamate; and Sorbitol   Review of Systems Review of Systems  Constitutional: Negative for chills and fever.  HENT: Negative for congestion and rhinorrhea.   Eyes: Negative for redness and visual disturbance.  Respiratory: Negative for shortness of breath and wheezing.   Cardiovascular: Negative for chest pain and palpitations.  Gastrointestinal: Positive for nausea. Negative for vomiting.  Genitourinary: Negative for dysuria and urgency.  Musculoskeletal: Positive for back pain. Negative for arthralgias and myalgias.  Skin: Negative for pallor and wound.  Neurological: Negative for dizziness and headaches.     Physical Exam Updated Vital Signs BP (!) 161/90   Pulse 79   Temp 98.6 F (37 C) (Oral)   Resp 12   Ht 5' 2"  (1.575 m)   Wt 130 lb (59 kg)   SpO2 99%   BMI 23.78 kg/m   Physical Exam  Constitutional: She is oriented to person, place, and time. She appears well-developed and well-nourished. No distress.  Appears uncomfortable  HENT:  Head: Normocephalic and atraumatic.    Eyes: EOM are normal. Pupils are equal, round, and reactive to light.  Neck: Normal range of motion. Neck supple.  Cardiovascular: Normal rate and regular rhythm.  Exam reveals no gallop and no friction rub.   No murmur heard. Pulses:      Radial pulses are 2+ on the right side, and 2+ on the left side.  Pulmonary/Chest: Effort normal. She has no wheezes. She has no rales.  Abdominal: Soft. She exhibits no distension. There is no tenderness.  Musculoskeletal: She exhibits tenderness (focal tenderness just medial to the scapula on the left). She exhibits no edema.  Neurological: She is alert and oriented to person, place, and time.  Skin: Skin is warm and dry. She is not diaphoretic.  Psychiatric: She has a normal mood and  affect. Her behavior is normal.  Nursing note and vitals reviewed.    ED Treatments / Results  Labs (all labs ordered are listed, but only abnormal results are displayed) Labs Reviewed  COMPREHENSIVE METABOLIC PANEL - Abnormal; Notable for the following:       Result Value   Glucose, Bld 104 (*)    All other components within normal limits  LIPID PANEL - Abnormal; Notable for the following:    Cholesterol 221 (*)    LDL Cholesterol 130 (*)    All other components within normal limits  I-STAT CHEM 8, ED - Abnormal; Notable for the following:    Calcium, Ion 1.05 (*)    All other components within normal limits  CBC WITH DIFFERENTIAL/PLATELET  PROTIME-INR  APTT  TROPONIN I  Randolm Idol, ED  I-STAT TROPOININ, ED    EKG  EKG Interpretation  Date/Time:  Tuesday Mar 27 2017 20:22:52 EDT Ventricular Rate:  93 PR Interval:    QRS Duration: 98 QT Interval:  370 QTC Calculation: 461 R Axis:   78 Text Interpretation:  Sinus rhythm No significant change since last tracing Confirmed by Bindu Docter MD, DANIEL [phone]) on 03/27/2017 8:25:33 PM       Radiology Dg Chest Portable 1 View  Result Date: 03/27/2017 CLINICAL DATA:  Stabbing pain behind the left  scapula EXAM: PORTABLE CHEST 1 VIEW COMPARISON:  07/03/2016 FINDINGS: Elevated left diaphragm as before. Post sternotomy changes. Left basilar atelectasis. No effusion. No pneumothorax. Normal heart size. Aortic atherosclerosis. Stable calcified left mid lung nodule. IMPRESSION: Elevated left diaphragm with basilar atelectasis, similar compared to prior. No definite acute interval changes. Electronically Signed   By: Donavan Foil M.D.   On: 03/27/2017 21:05   Ct Angio Chest/abd/pel For Dissection W And/or Wo Contrast  Result Date: 03/27/2017 CLINICAL DATA:  76 y/o  F; severe back pain radiating down the arm. EXAM: CT ANGIOGRAPHY CHEST, ABDOMEN AND PELVIS TECHNIQUE: Multidetector CT imaging through the chest, abdomen and pelvis was performed using the standard protocol during bolus administration of intravenous contrast. Multiplanar reconstructed images and MIPs were obtained and reviewed to evaluate the vascular anatomy. CONTRAST:  100 cc Isovue 370 COMPARISON:  07/23/2015 CT abdomen and pelvis. 07/03/2016 thoracic spine radiographs FINDINGS: CTA CHEST FINDINGS Cardiovascular: Preferential opacification of the thoracic aorta. No evidence of thoracic aortic aneurysm or dissection. Mild cardiomegaly. Status post CABG. No central or lobar pulmonary embolus. No pericardial effusion. Mediastinum/Nodes: No enlarged mediastinal, hilar, or axillary lymph nodes. Thyroid gland, trachea, and esophagus demonstrate no significant findings. Lungs/Pleura: Mild centrilobular emphysema of the lung apices. Multiple calcified granulomas of the lungs. Minor platelike atelectasis within the lung bases. No consolidation, pleural effusion, or pneumothorax. Musculoskeletal: No chest wall abnormality. No acute or significant osseous findings. Review of the MIP images confirms the above findings. CTA ABDOMEN AND PELVIS FINDINGS VASCULAR Aorta: Infrarenal abdominal aorta measures up to 2 cm. Moderate to severe calcific atherosclerosis of  the abdominal aorta. No evidence for dissection. Celiac: Patent without evidence of aneurysm, dissection, vasculitis or significant stenosis. SMA: Patent without evidence of aneurysm, dissection, vasculitis or significant stenosis. Renals: Both renal arteries are patent without evidence of aneurysm, dissection, vasculitis, fibromuscular dysplasia or significant stenosis. IMA: Patent without evidence of aneurysm, dissection, vasculitis or significant stenosis. Inflow: Patent without evidence of aneurysm, dissection, vasculitis or significant stenosis. Veins: No obvious venous abnormality within the limitations of this arterial phase study. Review of the MIP images confirms the above findings. NON-VASCULAR Hepatobiliary: No focal liver abnormality.  Cholelithiasis. No intra or extrahepatic biliary ductal dilatation. Pancreas: Unremarkable. No pancreatic ductal dilatation or surrounding inflammatory changes. Spleen: Normal in size without focal abnormality. Adrenals/Urinary Tract: Normal adrenal glands. Right kidney upper pole cyst measuring 4.2 cm. Right kidney interpolar nonobstructing caliceal stone measuring 4 mm. No hydronephrosis. Normal bladder. Stomach/Bowel: Stomach is within normal limits. Appendix appears normal. No evidence of bowel wall thickening, distention, or inflammatory changes. Lymphatic: No lymphadenopathy. Reproductive: Uterine calcifications without appreciable mass, likely small underlying myomas. Normal adnexa. Other: No abdominal wall hernia or abnormality. No abdominopelvic ascites. Musculoskeletal: L1 vertebral body moderate anterior compression deformity is new. Transitional S1 vertebral body. Stable grade 1 L4-5 anterolisthesis. Stable mild S-shaped curvature of the spine. Review of the MIP images confirms the above findings. IMPRESSION: 1. L1 vertebral body moderate anterior compression deformity with slight retropulsion of the superior endplate, new from 07/03/2016. 2. No evidence of  aortic dissection. Moderate aortic calcific atherosclerosis. 3. Mild centrilobular emphysema of the lungs. 4. Mild cardiomegaly.  Status post CABG. 5. Cholelithiasis. 6. Right kidney nonobstructing stone. Electronically Signed   By: Kristine Garbe M.D.   On: 03/27/2017 23:13    Procedures Procedures (including critical care time)  Medications Ordered in ED Medications  nitroGLYCERIN (NITROSTAT) SL tablet 0.4 mg (0.4 mg Sublingual Given 03/27/17 2050)  ondansetron (ZOFRAN) injection 4 mg (not administered)  aspirin chewable tablet 324 mg (324 mg Oral Given 03/27/17 2015)  heparin injection 3,550 Units (3,550 Units Intravenous Given 03/27/17 2015)  morphine 4 MG/ML injection 2 mg (2 mg Intravenous Given 03/27/17 2049)  ondansetron (ZOFRAN) injection 4 mg (4 mg Intravenous Given 03/27/17 2056)  HYDROmorphone (DILAUDID) injection 0.5 mg (0.5 mg Intravenous Given 03/27/17 2150)  iopamidol (ISOVUE-370) 76 % injection (100 mLs  Contrast Given 03/27/17 2233)     Initial Impression / Assessment and Plan / ED Course  I have reviewed the triage vital signs and the nursing notes.  Pertinent labs & imaging results that were available during my care of the patient were reviewed by me and considered in my medical decision making (see chart for details).     76 yo F With a chief complaint of back pain. This feels like her prior MI. At that time she stated that she did have some chest pain associated with it as well. Appears significantly uncomfortable on exam. Equal upper extremity pulses. Is markedly hypertensive.  Given nitroglycerin with no improvement of pain. Will give morphine. Reassess.  Continues to have severe pain, uncomfortable.  Given dilaudid, CT dissection study ordered.   Patient feeling much better on reassessment. Will check delta.  If normal d/c home.   Delta negative.   12:04 AM:  I have discussed the diagnosis/risks/treatment options with the patient and family and believe the pt to  be eligible for discharge home to follow-up with PCP. We also discussed returning to the ED immediately if new or worsening sx occur. We discussed the sx which are most concerning (e.g., sudden worsening pain, fever, inability to tolerate by mouth) that necessitate immediate return. Medications administered to the patient during their visit and any new prescriptions provided to the patient are listed below.  Medications given during this visit Medications  nitroGLYCERIN (NITROSTAT) SL tablet 0.4 mg (0.4 mg Sublingual Given 03/27/17 2050)  ondansetron (ZOFRAN) injection 4 mg (not administered)  aspirin chewable tablet 324 mg (324 mg Oral Given 03/27/17 2015)  heparin injection 3,550 Units (3,550 Units Intravenous Given 03/27/17 2015)  morphine 4 MG/ML injection 2 mg (2 mg  Intravenous Given 03/27/17 2049)  ondansetron [facility]) injection 4 mg (4 mg Intravenous Given 03/27/17 2056)  HYDROmorphone (DILAUDID) injection 0.5 mg (0.5 mg Intravenous Given 03/27/17 2150)  iopamidol (ISOVUE-370) 76 % injection (100 mLs  Contrast Given 03/27/17 2233)     The patient appears reasonably screen and/or stabilized for discharge and I doubt any other medical condition or other [facility] requiring further screening, evaluation, or treatment in the ED at this time prior to discharge.    Final Clinical Impressions(s) / ED Diagnoses   Final diagnoses:  Acute left-sided thoracic back pain    New Prescriptions New Prescriptions   MORPHINE (MSIR) 15 MG TABLET    Take 1 tablet (15 mg total) by mouth every 4 (four) hours as needed for severe pain.     Deno Etienne, DO 03/28/17 0004

## 2017-03-27 NOTE — ED Triage Notes (Signed)
Patient reports CP for 24h. Similar pain when she had her MI a few years ago. Stabbing pain behind left scapula radiating down left elbow. Unable to get comfortable.

## 2017-03-27 NOTE — ED Notes (Signed)
Back from ct.

## 2017-03-27 NOTE — ED Provider Notes (Signed)
Notified by Sharilyn Sites, PA, that order for heparin was placed under my name. I did not evaluate this patient nor did I place an order for heparin, verbally or electronically in EPIC. Notified Charge RN and Dr. Adela Lank, supervising attending.    Shaune Pollack, MD 03/27/17 2118

## 2017-03-28 MED ORDER — ONDANSETRON 4 MG PO TBDP: 4.0000 mg | ORAL_TABLET | Freq: Three times a day (TID) | ORAL | 0 refills | Status: DC | PRN

## 2017-03-28 MED ORDER — MORPHINE SULFATE 15 MG PO TABS: 15.0000 mg | ORAL_TABLET | ORAL | 0 refills | Status: DC | PRN

## 2017-03-28 NOTE — Discharge Instructions (Signed)
Take tylenol 1000mg(2 extra strength) four times a day.  ° °Then take the pain medicine if you feel like you need it. Narcotics do not help with the pain, they only make you care about it less.  You can become addicted to this, people may break into your house to steal it.  It will constipate you.  If you drive under the influence of this medicine you can get a DUI.   ° °

## 2017-04-06 ENCOUNTER — Telehealth: Payer: Self-pay | Admitting: Pharmacist

## 2017-04-06 ENCOUNTER — Other Ambulatory Visit: Payer: Medicare Other | Admitting: *Deleted

## 2017-04-06 DIAGNOSIS — R945 Abnormal results of liver function studies: Secondary | ICD-10-CM

## 2017-04-06 DIAGNOSIS — I10 Essential (primary) hypertension: Secondary | ICD-10-CM

## 2017-04-06 DIAGNOSIS — R7989 Other specified abnormal findings of blood chemistry: Secondary | ICD-10-CM

## 2017-04-06 DIAGNOSIS — E7800 Pure hypercholesterolemia, unspecified: Secondary | ICD-10-CM

## 2017-04-06 NOTE — Addendum Note (Signed)
Addended by: Tonita PhoenixBOWDEN, Henna Derderian K on: 04/06/2017 03:04 PM   Modules accepted: Orders

## 2017-04-06 NOTE — Telephone Encounter (Signed)
Pt here for lab test and asked to speak to pharmacist. She reports that she fractured her back and has been taking high dose APAP. She is concerned about her liver function. She was scheduled for a hepatic panel today to follow up with start of Zetia. She reports that she did not start Zetia yet due to all her back issues. Advised that hepatic panel would reveal liver injury due to APAP since she has not started Zetia. She states understanding. She also reports that her pain has been very intense and her medications are not lasting the full dosing schedule. Advised she follow up with prescribing provider (she has a follow up scheduled for Monday with Ortho). She states understanding and appreciation. She will call when she decides to start on Zetia.

## 2017-04-07 LAB — HEPATIC FUNCTION PANEL
ALBUMIN: 3.7 g/dL (ref 3.5–4.8)
ALT: 172 IU/L — ABNORMAL HIGH (ref 0–32)
AST: 106 IU/L — ABNORMAL HIGH (ref 0–40)
Alkaline Phosphatase: 156 IU/L — ABNORMAL HIGH (ref 39–117)
BILIRUBIN TOTAL: 0.5 mg/dL (ref 0.0–1.2)
Bilirubin, Direct: 0.19 mg/dL (ref 0.00–0.40)
TOTAL PROTEIN: 6.9 g/dL (ref 6.0–8.5)

## 2017-06-01 ENCOUNTER — Encounter: Payer: Self-pay | Admitting: Neurology

## 2017-06-01 ENCOUNTER — Ambulatory Visit (INDEPENDENT_AMBULATORY_CARE_PROVIDER_SITE_OTHER): Payer: Medicare Other | Admitting: Neurology

## 2017-06-01 VITALS — BP 130/79 | HR 88 | Ht 62.0 in | Wt 131.4 lb

## 2017-06-01 DIAGNOSIS — R9089 Other abnormal findings on diagnostic imaging of central nervous system: Secondary | ICD-10-CM | POA: Diagnosis not present

## 2017-06-01 DIAGNOSIS — G911 Obstructive hydrocephalus: Secondary | ICD-10-CM

## 2017-06-01 DIAGNOSIS — G319 Degenerative disease of nervous system, unspecified: Secondary | ICD-10-CM | POA: Diagnosis not present

## 2017-06-01 DIAGNOSIS — E538 Deficiency of other specified B group vitamins: Secondary | ICD-10-CM

## 2017-06-01 NOTE — Patient Instructions (Signed)
Remember to drink plenty of fluid, eat healthy meals and do not skip any meals. Try to eat protein with a every meal and eat a healthy snack such as fruit or nuts in between meals. Try to keep a regular sleep-wake schedule and try to exercise daily, particularly in the form of walking, 20-30 minutes a day, if you can.   As far as diagnostic testing: MRI brain, lab  I would like to see you back in one year, sooner if we need to. Please call us with any interim questions, concerns, problems, updates or refill requests.   Our phone number is [phone]. We also have an after hours call service for urgent matters and there is a physician on-call for urgent questions. For any emergencies you know to call 911 or go to the nearest emergency room

## 2017-06-01 NOTE — Progress Notes (Signed)
[facility]    Provider:  Dr Jaynee Eagles Referring Provider: Melina Schools, MD, Suella Broad M.D. Primary Care Physician:  Leamon Arnt, MD  CC:  Abnormality on MRI brain  HPI:  Dana Palmer is a 76 y.o. female here as a referral from Dr. Jonni Sanger for abnormality on MRI brain. She is here with daughter who also provides information. Patient denies any cognitive issues. She doesn't have any problems recalling information that she needs, she drives, not getting lost, She drove from [place] to [place] in July 4th traffic and did well, She lives alone and manages all her own ADLs and IADLs. Daughter notices some mild forgetfullness because she has lived with mom, little things nothing significant. No difficulty with paying bills or cooking or doing complicated tasks. Patient is slowing down on yardwork and housework but otherwise she is doing well. She has gotten "turned around" driving sometimes nothing concerning but finds her way per daughter. No changes in personality. No other focal neurologic deficits, associated symptoms, inciting events or modifiable factors.No FHx of dementia.   Reviewed notes, labs and imaging from outside physicians, which showed:   [facility] This is a 76 year old female who presented up for back pain. Symptoms started early May. Described this pain and aching. The cervical MRI demonstrated multilevel degenerative changes with mild to moderate foraminal stenosis and facet arthrosis as well as severe left foraminal stenosis at C6-C7 of about mild enlargement of the lateral and third ventricles possibly consistent with early onset dementia or gait disturbance. She was seen in the emergency room because of severe neck and radicular left arm pain. She was started on Lyrica. She was referred for injection therapy for left C7 radiculopathy. Recent AST and ALT was elevated and she stopped taking Tylenol. She also has cognitive issues, and MRI of the  brain possibly suggestive of dementia per report. Per report (I do not have the disc was met) mild enlargement of the lateral and third ventricles on the sagittal scout sequence disproportion to the size of the peripheral sulci in the occipital lobes.   Reviewed labs, CMP unremarkable with glucose 104, BUN 13 and creatinine 0.79 03/2017.  Review of Systems: Patient complains of symptoms per HPI as well as the following symptoms: no CP, no SOB. Pertinent negatives and positives per HPI. All others negative.   Social History   Social History  . Marital status: Married    Spouse name: N/A  . Number of children: 3  . Years of education: Assoc   Occupational History  . retired Marine scientist    Social History Main Topics  . Smoking status: Former Smoker    Packs/day: 0.50    Years: 50.00    Types: Cigarettes    Quit date: 05/21/2001  . Smokeless tobacco: Never Used  . Alcohol use No     Comment: 4x/yr  . Drug use: No  . Sexual activity: Not on file   Other Topics Concern  . Not on file   Social History Narrative   Lives with her daughter, takes care of her own ADLs and iADLs   Right-handed   Caffeine: 3 cups per day    Family History  Problem Relation Age of Onset  . Heart disease Father   . Urolithiasis Father   . Pancreatic disease Mother   . Heart failure Mother   . Liver cancer Mother   . Cancer Paternal Grandmother   . Heart attack Paternal Grandfather   . Heart block Sister   .  Rheum arthritis Sister   . Hypertension Sister   . Stroke Sister   . Prostate cancer Son   . Arthritis Son        Psoriatic    Past Medical History:  Diagnosis Date  . CAD (coronary artery disease)    a. ant-lat STEMI 6/16 >> LHC:  ostial to mid LAD 99% with complex thrombus, mid to dist LAD 99%, D2 80%, prox RCA 50%, mid RCA 50%, EF 35% with ant-apical AK >>  b. s/p CABG (L-LAD, S-Dx)  . Essential hypertension   . Gallstone   . HLD (hyperlipidemia)   . Ischemic cardiomyopathy    a.  echo 6/16:  EF 35%, mild LVH, periapical HK, Gr 2 DD;  b.  Echo 7/16:  Mild LVH, mild focal basal septal hypertrophy, EF 60-65%, no RWMA, trivial AI, MAC, mild increased PASP, trivial effusion  . Osteoporosis   . Pneumonia   . Psoriasis   . UTI (lower urinary tract infection)     Past Surgical History:  Procedure Laterality Date  . CARDIAC CATHETERIZATION N/A 05/22/2015   Procedure: Left Heart Cath and Coronary Angiography;  Surgeon: Belva Crome, MD;  Location: [place] [place] [facility];  Service: Cardiovascular;  Laterality: N/A;  . CARPAL TUNNEL RELEASE    . CORONARY ARTERY BYPASS GRAFT    . CORONARY ARTERY BYPASS GRAFT N/A 05/22/2015   Procedure: CORONARY ARTERY BYPASS GRAFTING (CABG);  Surgeon: Melrose Nakayama, MD;  Location: [place];  Service: Open Heart Surgery;  Laterality: N/A;  . TEE WITHOUT CARDIOVERSION  05/22/2015   Procedure: TRANSESOPHAGEAL ECHOCARDIOGRAM (TEE);  Surgeon: Melrose Nakayama, MD;  Location: [facility];  Service: Open Heart Surgery;;  . TONSILLECTOMY    . TUBAL LIGATION  1971  . WRIST FRACTURE SURGERY Bilateral 11/26/2008   Open reduction    Current Outpatient Prescriptions  Medication Sig Dispense Refill  . aspirin EC 81 MG tablet Take 81 mg by mouth at bedtime.    . calcipotriene (DOVONOX) 0.005 % ointment Apply topically 2 (two) times daily as needed.    . clobetasol cream (TEMOVATE) 0.34 % Apply 1 application topically 2 (two) times daily as needed (psoriasis).     . ezetimibe (ZETIA) 10 MG tablet Take 1 tablet (10 mg total) by mouth daily. 30 tablet 11  . losartan (COZAAR) 50 MG tablet Take 1 tablet (50 mg total) by mouth daily. 90 tablet 3   No current facility-administered medications for this visit.     Allergies as of 06/01/2017 - Review Complete 06/01/2017  Allergen Reaction Noted  . Avelox [moxifloxacin] Shortness Of Breath and Other (See Comments) 08/24/2012  . Humira [adalimumab] Other (See Comments) 04/24/2016  . Lipitor [atorvastatin] Other  (See Comments) 08/12/2015  . Ultram [tramadol hcl] Nausea Only 08/31/2016  . Codeine Nausea And Vomiting and Rash 08/24/2012  . Methotrexate derivatives Nausea And Vomiting 05/23/2015  . Sulfa antibiotics Nausea Only and Rash 08/24/2012  . Statins Other (See Comments) 03/27/2017  . Monosodium glutamate Other (See Comments) 04/24/2016  . Sorbitol Diarrhea 04/24/2016    Vitals: BP 130/79   Pulse 88   Ht _0  (1.575 m)   Wt 131 lb 6.4 oz (59.6 kg)   BMI 24.03 kg/m  Last Weight:  Wt Readings from Last 1 Encounters:  06/01/17 131 lb 6.4 oz (59.6 kg)   Last Height:   Ht Readings from Last 1 Encounters:  06/01/17 _1  (1.575 m)   Physical exam: Exam: Gen: NAD, conversant, well nourised,  obese, well groomed                     CV: RRR, +SEM. No Carotid Bruits. No peripheral edema, warm, nontender Eyes: Conjunctivae clear without exudates or hemorrhage  Neuro: Detailed Neurologic Exam  Speech:    Speech is normal; fluent and spontaneous with normal comprehension.  Cognition:    The patient is oriented to person, place, and time;     recent and remote memory intact;     language fluent;     normal attention, concentration,     fund of knowledge Cranial Nerves:    The pupils are equal, round, and reactive to light. The fundi are normal and spontaneous venous pulsations are present. Visual fields are full to finger confrontation. Extraocular movements are intact. Trigeminal sensation is intact and the muscles of mastication are normal. The face is symmetric. The palate elevates in the midline. Hearing intact. Voice is normal. Shoulder shrug is normal. The tongue has normal motion without fasciculations.   Coordination:    Normal finger to nose and heel to shin. Normal rapid alternating movements.   Gait:    Heel-toe and tandem gait are normal.   Motor Observation:    No asymmetry, no atrophy, and no involuntary movements noted. Tone:    Normal muscle tone.    Posture:     Posture is normal. normal erect    Strength:    Strength is V/V in the upper and lower limbs.      Sensation: intact to LT     Reflex Exam:  DTR's:    Deep tendon reflexes in the upper and lower extremities are normal bilaterally.   Toes:    The toes are downgoing bilaterally.   Clonus:    Clonus is absent.       Assessment/Plan:  This is a lovely 76 year old patient who is here with her daughter regarding abnormal brain imaging that was seen on an MRI of the cervical spine.Brain enlargement of the lateral and third ventricles possibly consistent with early onset dementia or gait disturbance. Neurologic exam is unremarkable, patient denies cognitive changes but daughter reports forgetfulness. This is likely an incidental finding however we'll order MRI of the brain to further investigate ventriculomegaly and atrophy out of proportion.   Orders Placed This Encounter  Procedures  . MR BRAIN WO CONTRAST  . B12 and Folate Panel  . Methylmalonic acid, serum     Sarina Ill, MD  [facility] [address] [place], [place] [address]  Phone [phone] Fax [phone]

## 2017-06-06 LAB — METHYLMALONIC ACID, SERUM: METHYLMALONIC ACID: 250 nmol/L (ref 0–378)

## 2017-06-06 LAB — B12 AND FOLATE PANEL
FOLATE: 14.1 ng/mL (ref >3.0)
Vitamin B-12: 383 pg/mL (ref 232–1245)

## 2017-06-07 ENCOUNTER — Ambulatory Visit
Admission: RE | Admit: 2017-06-07 | Discharge: 2017-06-07 | Disposition: A | Discharge status: Home / Self Care | Payer: Medicare Other | Source: Ambulatory Visit | Attending: Neurology | Admitting: Neurology

## 2017-06-07 DIAGNOSIS — G911 Obstructive hydrocephalus: Secondary | ICD-10-CM

## 2017-06-07 DIAGNOSIS — R9089 Other abnormal findings on diagnostic imaging of central nervous system: Secondary | ICD-10-CM

## 2017-06-07 DIAGNOSIS — G319 Degenerative disease of nervous system, unspecified: Secondary | ICD-10-CM

## 2017-06-11 ENCOUNTER — Telehealth: Payer: Self-pay

## 2017-06-11 NOTE — Telephone Encounter (Signed)
-----   Message from Anson FretAntonia B Ahern, MD sent at 06/11/2017  5:31 PM EDT ----- MRI of the brain showed a normal amount of atrophy, nothing concerning for her age. There are white matter changes which is seen in normal aging but I'd like her to continue aspirin daily for prevention of strokes, watch her blood pressure and cholesterol. thanks

## 2017-06-11 NOTE — Telephone Encounter (Signed)
Called pt w/ MRI results and MD recommendations. Verbalized understanding and appreciation for call.

## 2017-07-25 ENCOUNTER — Ambulatory Visit: Payer: Self-pay | Admitting: Neurology

## 2017-10-16 ENCOUNTER — Other Ambulatory Visit: Payer: Self-pay

## 2017-10-16 ENCOUNTER — Encounter (HOSPITAL_COMMUNITY): Payer: Self-pay | Admitting: Emergency Medicine

## 2017-10-16 ENCOUNTER — Ambulatory Visit (HOSPITAL_COMMUNITY)
Admission: EM | Admit: 2017-10-16 | Discharge: 2017-10-16 | Disposition: A | Discharge status: Home / Self Care | Payer: Medicare Other | Attending: Family Medicine | Admitting: Family Medicine

## 2017-10-16 DIAGNOSIS — W57XXXA Bitten or stung by nonvenomous insect and other nonvenomous arthropods, initial encounter: Secondary | ICD-10-CM | POA: Diagnosis not present

## 2017-10-16 DIAGNOSIS — S20362A Insect bite (nonvenomous) of left front wall of thorax, initial encounter: Secondary | ICD-10-CM | POA: Diagnosis not present

## 2017-10-16 NOTE — ED Triage Notes (Signed)
Per pt she have a Tick head in the center of her chest. Per pt denies soreness, itchiness, per pt she's been taking care of daughter's dog and been doing a lot of outside work. Per pt she noticed it today at lunch time.

## 2017-10-16 NOTE — ED Provider Notes (Signed)
[facility]    CSN: [id number] Arrival date & time: 10/16/17  1607     History   Chief Complaint Chief Complaint  Patient presents with  . Insect Bite    HPI Dana Palmer is a 76 y.o. female.   Dana Palmer presents with concerns of a tick bite which she noticed today. She has been working in her yard and has been babysitting her daughters gold retriever yesterday afternoon and this morning. Around noon she noted a tick attached at her sternum. She attempted to remove it but the head broke off. She is concerned because the head is still in her skin. Without pain. The tick was not present longer than 24 hours. 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 Without rash or fevers.   ROS per HPI.       Past Medical History:  Diagnosis Date  . CAD (coronary artery disease)    a. ant-lat STEMI 6/16 >> LHC:  ostial to mid LAD 99% with complex thrombus, mid to dist LAD 99%, D2 80%, prox RCA 50%, mid RCA 50%, EF 35% with ant-apical AK >>  b. s/p CABG (L-LAD, S-Dx)  . Essential hypertension   . Gallstone   . HLD (hyperlipidemia)   . Ischemic cardiomyopathy    a. echo 6/16:  EF 35%, mild LVH, periapical HK, Gr 2 DD;  b.  Echo 7/16:  Mild LVH, mild focal basal septal hypertrophy, EF 60-65%, no RWMA, trivial AI, MAC, mild increased PASP, trivial effusion  . Osteoporosis   . Pneumonia   . Psoriasis   . UTI (lower urinary tract infection)     Patient Active Problem List   Diagnosis Date Noted  . Chronic diastolic heart failure ([place]) 02/12/2017  . Elevated LFTs 08/26/2015  . 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 Essential hypertension 06/07/2015  . Hyperlipidemia 06/07/2015  .  Remote Anterior Stemi 05/22/2015  . Psoriasis 05/22/2015    Past Surgical History:  Procedure Laterality Date  . CARDIAC CATHETERIZATION N/A 05/22/2015   Procedure: Left Heart Cath and Coronary Angiography;  Surgeon: Belva Crome, MD;  Location: [facility];  Service: Cardiovascular;  Laterality: N/A;  . CARPAL TUNNEL RELEASE    . CORONARY ARTERY BYPASS GRAFT    . CORONARY ARTERY BYPASS GRAFT N/A 05/22/2015   Procedure: CORONARY ARTERY BYPASS GRAFTING (CABG);  Surgeon: Melrose Nakayama, MD;  Location: [facility];  Service: Open Heart Surgery;  Laterality: N/A;  . TEE WITHOUT CARDIOVERSION  05/22/2015   Procedure: TRANSESOPHAGEAL ECHOCARDIOGRAM (TEE);  Surgeon: Melrose Nakayama, MD;  Location: [facility];  Service: Open Heart Surgery;;  . TONSILLECTOMY    . TUBAL LIGATION  1971  . WRIST FRACTURE SURGERY Bilateral 11/26/2008   Open reduction    OB History    No data available       Home Medications    Prior to Admission medications   Medication Sig Start Date End Date Taking? Authorizing Provider  aspirin EC 81 MG tablet Take 81 mg by mouth at bedtime.   Yes [provider]  calcipotriene (DOVONOX) 0.005 % ointment Apply topically 2 (two) times daily as needed.   Yes [provider]  clobetasol cream (TEMOVATE) 0.07 % Apply 1 application topically 2 (two) times daily as needed (psoriasis).    Yes [provider]  losartan (COZAAR) 50 MG tablet Take  1 tablet (50 mg total) by mouth daily. 02/12/17 10/16/17 Yes Belva Crome, MD  ezetimibe (ZETIA) 10 MG tablet Take 1 tablet (10 mg total) by mouth daily. 03/22/17 06/20/17  Belva Crome, MD    Family History Family History  Problem Relation Age of Onset  . Heart disease Father   . Urolithiasis Father   . Pancreatic disease Mother   . Heart failure Mother   . Liver cancer Mother   . Cancer Paternal Grandmother   . Heart attack Paternal Grandfather   . Heart block Sister   . Rheum arthritis Sister   . Hypertension Sister   . Stroke Sister   . Prostate cancer Son   . Arthritis Son        Psoriatic    Social History Social History   Tobacco Use    . Smoking status: Former Smoker    Packs/day: 0.50    Years: 50.00    Pack years: 25.00    Types: Cigarettes    Last attempt to quit: 05/21/2001    Years since quitting: 16.4  . Smokeless tobacco: Never Used  Substance Use Topics  . Alcohol use: No    Alcohol/week: 0.0 oz    Comment: 4x/yr  . Drug use: No     Allergies   Avelox [moxifloxacin]; Humira [adalimumab]; Lipitor [atorvastatin]; Ultram [tramadol hcl]; Codeine; Methotrexate derivatives; Sulfa antibiotics; Statins; Monosodium glutamate; and Sorbitol   Review of Systems Review of Systems   Physical Exam Triage Vital Signs ED Triage Vitals [10/16/17 1633]  Enc Vitals Group     BP (!) 167/94     Pulse      Resp 20     Temp 98.2 F (36.8 C)     Temp Source Oral     SpO2 96 %     Weight      Height      Head Circumference      Peak Flow      Pain Score 0     Pain Loc      Pain Edu?      Excl. in [place]?    No data found.  Updated Vital Signs BP (!) 167/94 (BP Location: Left Arm)   Temp 98.2 F (36.8 C) (Oral)   Resp 20   SpO2 96%   Visual Acuity Right Eye Distance:   Left Eye Distance:   Bilateral Distance:    Right Eye Near:   Left Eye Near:    Bilateral Near:     Physical Exam  Constitutional: She is oriented to person, place, and time. She appears well-developed and well-nourished. No distress.  Cardiovascular: Normal rate, regular rhythm and normal heart sounds.  Pulmonary/Chest: Effort normal and breath sounds normal.  Neurological: She is alert and oriented to person, place, and time.  Skin: Skin is warm and dry.        UC Treatments / Results  Labs (all labs ordered are listed, but only abnormal results are displayed) Labs Reviewed - No data to display  EKG  EKG Interpretation None       Radiology No results found.  Procedures Procedures (including critical care time)  Medications Ordered in UC Medications - No data to display   Initial Impression / Assessment and  Plan / UC Course  I have reviewed the triage vital signs and the nursing notes.  Pertinent labs & imaging results that were available during my care of the patient were reviewed by me and considered in  my medical decision making (see chart for details).     Majority of tick removed today, discussed with patient that the body will expel the very small piece which was unable to be removed. Return precautions provided.Patient verbalized understanding and agreeable to plan.    Final Clinical Impressions(s) / UC Diagnoses   Final diagnoses:  Tick bite, initial encounter    ED Discharge Orders    None       Controlled Substance Prescriptions [place] Controlled Substance Registry consulted? Not Applicable   Zigmund Gottron, NP 10/16/17 1657

## 2017-10-16 NOTE — Discharge Instructions (Signed)
Clean area with soap and water. The very small area of head remaining will most likely very easily be pushed out without any further treatment. Continue to monitor for rash, fevers, increased redness or pain. Return if experience these symptoms

## 2017-12-19 ENCOUNTER — Telehealth: Payer: Self-pay | Admitting: Interventional Cardiology

## 2017-12-19 DIAGNOSIS — M7989 Other specified soft tissue disorders: Secondary | ICD-10-CM

## 2017-12-19 NOTE — Telephone Encounter (Signed)
About 10 days ago pt was cleaning out drawers at her house.  Went to get up and states she caught her knee cap under the lip of the drawer handle and injured her leg.  States knee was swollen for several days but had started improving normally.  Pt drove to [place] and back in 3 days.  Since then she has began to have issues with her ankle and calf.  Pt concerned about a blood clot.  States over the last 48 hrs she has developed 1-2+ pitting edema in her left ankle and lower calf.  Improves at night with elevation.  Denies pain or redness.  Pt not currently on a fluid pill.  Advised I will send message to Dr. Katrinka BlazingSmith for review and advisement.

## 2017-12-19 NOTE — Telephone Encounter (Signed)
Bilateral lower extremity venous Doppler and duplex to rule out DVT

## 2017-12-19 NOTE — Telephone Encounter (Signed)
New message    Patient calling with concerns of blood clot, left lower extremity. Not SOB, no trouble walking.  Pt c/o swelling: STAT is pt has developed SOB within 24 hours  1) How much weight have you gained and in what time span? n/a  If swelling, where is the swelling located? Left lower 2) Are you currently taking a fluid pill? no  3) Are you currently SOB? no  4) Do you have a log of your daily weights (if so, list)? n/a  5) Have you gained 3 pounds in a day or 5 pounds in a week? n/a  6) Have you traveled recently? No

## 2017-12-20 ENCOUNTER — Ambulatory Visit (HOSPITAL_COMMUNITY)
Admission: RE | Admit: 2017-12-20 | Discharge: 2017-12-20 | Disposition: A | Discharge status: Home / Self Care | Payer: Medicare Other | Source: Ambulatory Visit | Attending: Cardiovascular Disease | Admitting: Cardiovascular Disease

## 2017-12-20 ENCOUNTER — Encounter (HOSPITAL_COMMUNITY): Payer: Self-pay

## 2017-12-20 DIAGNOSIS — M7989 Other specified soft tissue disorders: Secondary | ICD-10-CM | POA: Insufficient documentation

## 2017-12-20 NOTE — Progress Notes (Unsigned)
Today's left lower extremity venous duplex is negative for DVT. Preliminary results given to Dr. Katrinka BlazingSmith.

## 2017-12-20 NOTE — Telephone Encounter (Signed)
Spoke with pt and made her aware of Dr. Michaelle CopasSmith's recommendation.  Pt verbalized understanding and was in agreement with this plan.

## 2018-03-12 ENCOUNTER — Other Ambulatory Visit: Payer: Self-pay | Admitting: Interventional Cardiology

## 2018-04-10 ENCOUNTER — Encounter

## 2018-04-10 ENCOUNTER — Ambulatory Visit (INDEPENDENT_AMBULATORY_CARE_PROVIDER_SITE_OTHER): Payer: Medicare Other | Admitting: Interventional Cardiology

## 2018-04-10 ENCOUNTER — Encounter: Payer: Self-pay | Admitting: Interventional Cardiology

## 2018-04-10 VITALS — BP 122/74 | HR 101 | Ht 62.0 in | Wt 132.6 lb

## 2018-04-10 DIAGNOSIS — I10 Essential (primary) hypertension: Secondary | ICD-10-CM

## 2018-04-10 DIAGNOSIS — E7800 Pure hypercholesterolemia, unspecified: Secondary | ICD-10-CM

## 2018-04-10 DIAGNOSIS — I5032 Chronic diastolic (congestive) heart failure: Secondary | ICD-10-CM | POA: Diagnosis not present

## 2018-04-10 DIAGNOSIS — I25810 Atherosclerosis of coronary artery bypass graft(s) without angina pectoris: Secondary | ICD-10-CM

## 2018-04-10 NOTE — Progress Notes (Signed)
Cardiology Office Note    Date:  04/10/2018   ID:  Dana Palmer, DOB [birth year], MRN [id number]  PCP:  Patient, No Pcp Per  Cardiologist: [place] Grooms, MD   Chief Complaint  Patient presents with  . Coronary Artery Disease  . Hypertension  . Hyperlipidemia    History of Present Illness:  Dana Palmer is a 77 y.o. female who presents for CAD, CABG with LIMA to LAD and SVG to diagonal 5009, acute systolic heart failure resolved to normal EFgreater than 60% 2016, hypertension, and hyperlipidemia.  She is doing relatively well with the following exception: Multiple loose and mucus containing stools that she believes is related to losartan therapy.  This is gone on for greater than a year.  She also did not start Zetia as recommended because she felt it would aggravate the diarrhea.  She denies abdominal discomfort, blood in the urine or stool.  She otherwise has no complaints.    Past Medical History:  Diagnosis Date  . CAD (coronary artery disease)    a. ant-lat STEMI 6/16 >> LHC:  ostial to mid LAD 99% with complex thrombus, mid to dist LAD 99%, D2 80%, prox RCA 50%, mid RCA 50%, EF 35% with ant-apical AK >>  b. s/p CABG (L-LAD, S-Dx)  . Essential hypertension   . Gallstone   . HLD (hyperlipidemia)   . Ischemic cardiomyopathy    a. echo 6/16:  EF 35%, mild LVH, periapical HK, Gr 2 DD;  b.  Echo 7/16:  Mild LVH, mild focal basal septal hypertrophy, EF 60-65%, no RWMA, trivial AI, MAC, mild increased PASP, trivial effusion  . Osteoporosis   . Pneumonia   . Psoriasis   . UTI (lower urinary tract infection)     Past Surgical History:  Procedure Laterality Date  . CARDIAC CATHETERIZATION N/A 05/22/2015   Procedure: Left Heart Cath and Coronary Angiography;  Surgeon: Belva Crome, MD;  Location: [facility];  Service: Cardiovascular;  Laterality: N/A;  . CARPAL TUNNEL RELEASE    . CORONARY ARTERY BYPASS GRAFT    . CORONARY ARTERY BYPASS GRAFT N/A 05/22/2015   Procedure: CORONARY ARTERY BYPASS GRAFTING (CABG);  Surgeon: Melrose Nakayama, MD;  Location: [facility];  Service: Open Heart Surgery;  Laterality: N/A;  . TEE WITHOUT CARDIOVERSION  05/22/2015   Procedure: TRANSESOPHAGEAL ECHOCARDIOGRAM (TEE);  Surgeon: Melrose Nakayama, MD;  Location: [facility];  Service: Open Heart Surgery;;  . TONSILLECTOMY    . TUBAL LIGATION  1971  . WRIST FRACTURE SURGERY Bilateral 11/26/2008   Open reduction    Current Medications: Outpatient Medications Prior to Visit  Medication Sig Dispense Refill  . aspirin EC 81 MG tablet Take 81 mg by mouth at bedtime.    . calcipotriene (DOVONOX) 0.005 % cream Apply 1 application topically 2 (two) times daily as needed. (PSORIASIS)    . clobetasol cream (TEMOVATE) 3.81 % Apply 1 application topically 2 (two) times daily as needed (psoriasis).     Marland Kitchen losartan (COZAAR) 50 MG tablet TAKE 1 TABLET BY MOUTH EVERY DAY 90 tablet 0  . calcipotriene (DOVONOX) 0.005 % ointment Apply topically 2 (two) times daily as needed.    . ezetimibe (ZETIA) 10 MG tablet Take 1 tablet (10 mg total) by mouth daily. 30 tablet 11   No facility-administered medications prior to visit.      Allergies:   Avelox [moxifloxacin]; Humira [adalimumab]; Lipitor [atorvastatin]; Ultram [tramadol hcl]; Codeine; Methotrexate derivatives; Sulfa antibiotics; Statins; Monosodium  glutamate; and Sorbitol   Social History   Socioeconomic History  . Marital status: Married    Spouse name: Not on file  . Number of children: 3  . Years of education: Assoc  . Highest education level: Not on file  Occupational History  . Occupation: retired Marine scientist  Social Needs  . Financial resource strain: Not on file  . Food insecurity:    Worry: Not on file    Inability: Not on file  . Transportation needs:    Medical: Not on file    Non-medical: Not on file  Tobacco Use  . Smoking status: Former Smoker    Packs/day: 0.50    Years: 50.00    Pack years: 25.00    Types:  Cigarettes    Last attempt to quit: 05/21/2001    Years since quitting: 16.8  . Smokeless tobacco: Never Used  Substance and Sexual Activity  . Alcohol use: No    Alcohol/week: 0.0 oz    Comment: 4x/yr  . Drug use: No  . Sexual activity: Not on file  Lifestyle  . Physical activity:    Days per week: Not on file    Minutes per session: Not on file  . Stress: Not on file  Relationships  . Social connections:    Talks on phone: Not on file    Gets together: Not on file    Attends religious service: Not on file    Active member of club or organization: Not on file    Attends meetings of clubs or organizations: Not on file    Relationship status: Not on file  Other Topics Concern  . Not on file  Social History Narrative   Lives with her daughter, takes care of her own ADLs and iADLs   Right-handed   Caffeine: 3 cups per day     Family History:  The patient's family history includes Arthritis in her son; Cancer in her paternal grandmother; Heart attack in her paternal grandfather; Heart block in her sister; Heart disease in her father; Heart failure in her mother; Hypertension in her sister; Liver cancer in her mother; Pancreatic disease in her mother; Prostate cancer in her son; Rheum arthritis in her sister; Stroke in her sister; Urolithiasis in her father.   ROS:   Please see the history of present illness.    Currently under significant stress, diarrhea as mentioned, unexplained weight loss, excessive fatigue, and dizziness. All other systems reviewed and are negative.   PHYSICAL EXAM:   VS:  BP 122/74   Pulse (!) 101   Ht _0  (1.575 m)   Wt 132 lb 9.6 oz (60.1 kg)   BMI 24.25 kg/m    GEN: Well nourished, well developed, in no acute distress  HEENT: normal  Neck: no JVD, carotid bruits, or masses Cardiac: RRR; no murmurs, rubs, or gallops,no edema  Respiratory:  clear to auscultation bilaterally, normal work of breathing GI: soft, nontender, nondistended, + BS MS:  no deformity or atrophy  Skin: warm and dry, no rash Neuro:  Alert and Oriented x 3, Strength and sensation are intact Psych: euthymic mood, full affect  Wt Readings from Last 3 Encounters:  04/10/18 132 lb 9.6 oz (60.1 kg)  06/01/17 131 lb 6.4 oz (59.6 kg)  03/27/17 130 lb (59 kg)      Studies/Labs Reviewed:   EKG:  EKG sinus tachycardia 101.  Left atrial abnormality.  Nonspecific ST abnormality.  Recent Labs: No results found for requested labs  within last 8760 hours.   Lipid Panel    Component Value Date/Time   CHOL 221 (H) 03/27/2017 2012   CHOL 222 (H) 03/19/2017 0000   TRIG 62 03/27/2017 2012   HDL 79 03/27/2017 2012   HDL 75 03/19/2017 0000   CHOLHDL 2.8 03/27/2017 2012   VLDL 12 03/27/2017 2012   LDLCALC 130 (H) 03/27/2017 2012   LDLCALC 113 (H) 03/19/2017 0000   LDLDIRECT 124 (H) 03/19/2017 0000    Additional studies/ records that were reviewed today include:  None    ASSESSMENT:    1. 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   2. Essential hypertension   3. Chronic diastolic heart failure ([place])   4. Pure hypercholesterolemia      PLAN:  In order of problems listed above:  1. CAD without angina.  She received LIMA to LAD and is asymptomatic. 2. Current good blood pressure control on relatively low-dose losartan.  She feels that losartan is responsible for frequent mucus containing stools/diarrhea.  Discontinue losartan.  Follow-up in 1 month for both blood pressure check and query concerning course of frequent bowel movements.  If still having the GI problems she will need to be referred for consultation.  I have asked her to monitor her blood pressure using her home cuff and notify us if she has significant elevation above 160/90 mmHg. 3. No evidence of volume overload. 4. We have tried to treat significant LDL elevation last documented to be 130.  Statin therapy with atorvastatin was discontinued more than 1 year ago because of dark urine.   This was also in the setting where Tylenol and ibuprofen were felt to be causing toxicity.  She was prescribed Zetia 1 year ago and never started taking the medication.  No action is taken today because of the above dialogue concerning diarrhea and potential for medication side effect.  Therapy for lipids needs to be readdressed in 30 days depending upon the course of diarrhea.    Medication Adjustments/Labs and Tests Ordered: Current medicines are reviewed at length with the patient today.  Concerns regarding medicines are outlined above.  Medication changes, Labs and Tests ordered today are listed in the Patient Instructions below. Patient Instructions  Medication Instructions:  1) DISCONTINUE Losartan  Labwork: None  Testing/Procedures: None  Follow-Up: Your physician recommends that you schedule a follow-up appointment in: 1 month with a PA or NP.  Your physician wants you to follow-up in: 1 year with Dr. Tamala Julian.  You will receive a reminder letter in the mail two months in advance. If you don't receive a letter, please call our office to schedule the follow-up appointment.    Any Other Special Instructions Will Be Listed Below (If Applicable).     If you need a refill on your cardiac medications before your next appointment, please call your pharmacy.      Signed, [place] Grooms, MD  04/10/2018 4:26 PM    [facility] [place], [place], [place]  [address] Phone: [phone]; Fax: [phone]

## 2018-04-10 NOTE — Patient Instructions (Signed)
Medication Instructions:  1) DISCONTINUE Losartan  Labwork: None  Testing/Procedures: None  Follow-Up: Your physician recommends that you schedule a follow-up appointment in: 1 month with a PA or NP.  Your physician wants you to follow-up in: 1 year with Dr. Katrinka Blazing.  You will receive a reminder letter in the mail two months in advance. If you don't receive a letter, please call our office to schedule the follow-up appointment.    Any Other Special Instructions Will Be Listed Below (If Applicable).     If you need a refill on your cardiac medications before your next appointment, please call your pharmacy.

## 2018-05-13 ENCOUNTER — Ambulatory Visit (INDEPENDENT_AMBULATORY_CARE_PROVIDER_SITE_OTHER): Payer: Medicare Other | Admitting: Physician Assistant

## 2018-05-13 ENCOUNTER — Encounter: Payer: Self-pay | Admitting: Physician Assistant

## 2018-05-13 VITALS — BP 152/80 | HR 92 | Ht 62.0 in | Wt 128.0 lb

## 2018-05-13 DIAGNOSIS — I2510 Atherosclerotic heart disease of native coronary artery without angina pectoris: Secondary | ICD-10-CM

## 2018-05-13 DIAGNOSIS — E785 Hyperlipidemia, unspecified: Secondary | ICD-10-CM | POA: Diagnosis not present

## 2018-05-13 DIAGNOSIS — J069 Acute upper respiratory infection, unspecified: Secondary | ICD-10-CM | POA: Diagnosis not present

## 2018-05-13 DIAGNOSIS — I10 Essential (primary) hypertension: Secondary | ICD-10-CM | POA: Diagnosis not present

## 2018-05-13 MED ORDER — AMLODIPINE BESYLATE 5 MG PO TABS: 5.0000 mg | ORAL_TABLET | Freq: Every day | ORAL | 3 refills | Status: DC

## 2018-05-13 NOTE — Patient Instructions (Signed)
Medication Instructions:  1. STOP LOSARTAN   2. START NORVASC 5 MG 1 TABLET DAILY; RX HAS BEEN SENT IN   Labwork: 1. FASTING LIPID AND LIVER PANEL TO BE DONE IN 2 WEEKS   Testing/Procedures: NONE ORDERED TODAY  Follow-Up: 1. YOU ARE BEING REFERRED TO THE LIPID CLINIC AND HYPERTENSION CLINIC IN THE NEXT 3-4 WEEKS   Any Other Special Instructions Will Be Listed Below (If Applicable).     If you need a refill on your cardiac medications before your next appointment, please call your pharmacy.

## 2018-05-13 NOTE — Progress Notes (Signed)
Cardiology Office Note:    Date:  05/13/2018   ID:  Krista Blue, DOB 1941-06-12, MRN [id number]  PCP:  Patient, No Pcp Per  Cardiologist:  [place] Grooms, MD   Referring MD: No ref. provider found   Chief Complaint  Patient presents with  . Follow-up    BP, Cholesterol    History of Present Illness:    Dana Palmer is a 77 y.o. female with coronary artery disease status post anterolateral MI in July 2016 followed by emergent CABG. EF was 35% at the time of her surgery.  Follow-up echo demonstrated improved LV function with normal EF.  Last seen by Dr. Tamala Julian 04/10/2018.  Losartan was stopped secondary to ongoing diarrhea.  Of note, the patient was also off of her lipid therapy.  She has stopped taking atorvastatin due to discolored urine.  She also had not started to ezetimibe.  Dana Palmer returns for follow up.  She has been moving (down-sizing).  This has caused a lot of stress.  She has also developed a URI.  She has had a cough and congestion the last 2 days.  She denies chest pain, significant shortness of breath.  She denies syncope, paroxysmal nocturnal dyspnea, edema.  She has been taking Sudafed.  She was off of Losartan for about a week.  Her BP was in the 160s/100s.  She resumed the Losartan.  Her diarrhea was better off of the Losartan.  I reviewed her chart today.  She was taken off of Metoprolol in the past and notes indicate she felt better off of this.  Her LFTs were 5 x upper limits of normal (ALT 180) in 2016 on Lipitor.  She has never tried to take the Ezetimibe.    Prior CV studies:   The following studies were reviewed today:  Echo 06/08/2015 Mild LVH, mild focal basal septal hypertrophy, EF 60-65, normal wall motion, grade 1 diastolic dysfunction, trivial AI, MAC, mild increased PASP  Echo 05/23/15 Mild LVH, EF 35%, periapical HK, grade 2 diastolic dysfunction MAC  LHC 05/22/15 LAD: Ostial to mid 99%, mid to distal 99%, D2 80% RCA: Proximal 50%, mid 50% EF 35%  with anteroapical akinesis  Past Medical History:  Diagnosis Date  .  Remote Anterior Stemi 05/22/2015  . CAD (coronary artery disease)    a. ant-lat STEMI 6/16 >> LHC:  ostial to mid LAD 99% with complex thrombus, mid to dist LAD 99%, D2 80%, prox RCA 50%, mid RCA 50%, EF 35% with ant-apical AK >>  b. s/p CABG (L-LAD, S-Dx)  . Chronic diastolic heart failure ([place]) 02/12/2017  . Elevated LFTs 08/26/2015  . Essential hypertension   . Gallstone   . HLD (hyperlipidemia)   . Hyperlipidemia 06/07/2015  . Ischemic cardiomyopathy    a. echo 6/16:  EF 35%, mild LVH, periapical HK, Gr 2 DD;  b.  Echo 7/16:  Mild LVH, mild focal basal septal hypertrophy, EF 60-65%, no RWMA, trivial AI, MAC, mild increased PASP, trivial effusion  . Osteoporosis   . Pneumonia   . Psoriasis   . UTI (lower urinary tract infection)    Surgical Hx: The patient  has a past surgical history that includes Tubal ligation (1971); Carpal tunnel release; Tonsillectomy; Coronary artery bypass graft; Cardiac catheterization (N/A, 05/22/2015); Coronary artery bypass graft (N/A, 05/22/2015); TEE without cardioversion (05/22/2015); and Wrist fracture surgery (Bilateral, 11/26/2008).   Current Medications: Current Meds  Medication Sig  . aspirin EC 81 MG tablet Take 81 mg by  mouth at bedtime.  . calcipotriene (DOVONOX) 0.005 % cream Apply 1 application topically 2 (two) times daily as needed. (PSORIASIS)  . clobetasol cream (TEMOVATE) 6.29 % Apply 1 application topically 2 (two) times daily as needed (psoriasis).      Allergies:   Avelox [moxifloxacin]; Humira [adalimumab]; Lipitor [atorvastatin]; Ultram [tramadol hcl]; Codeine; Methotrexate derivatives; Sulfa antibiotics; Statins; Monosodium glutamate; and Sorbitol   Social History   Tobacco Use  . Smoking status: Former Smoker    Packs/day: 0.50    Years: 50.00    Pack years: 25.00    Types: Cigarettes    Last attempt to quit: 05/21/2001    Years since quitting: 16.9  .  Smokeless tobacco: Never Used  Substance Use Topics  . Alcohol use: No    Alcohol/week: 0.0 oz    Comment: 4x/yr  . Drug use: No     Family Hx: The patient's family history includes Arthritis in her son; Cancer in her paternal grandmother; Heart attack in her paternal grandfather; Heart block in her sister; Heart disease in her father; Heart failure in her mother; Hypertension in her sister; Liver cancer in her mother; Pancreatic disease in her mother; Prostate cancer in her son; Rheum arthritis in her sister; Stroke in her sister; Urolithiasis in her father.  ROS:   Please see the history of present illness.    Review of Systems  HENT: Positive for hearing loss.   Eyes: Positive for visual disturbance.  Respiratory: Positive for cough, shortness of breath and wheezing.   Gastrointestinal: Positive for diarrhea.   All other systems reviewed and are negative.   EKGs/Labs/Other Test Reviewed:    EKG:  EKG is  ordered today.  The ekg ordered today demonstrates normal sinus rhythm, heart rate 92, normal axis, QTC 464, similar to prior tracing 04/10/18  Recent Labs: No results found for requested labs within last 8760 hours.   Recent Lipid Panel Lab Results  Component Value Date/Time   CHOL 221 (H) 03/27/2017 08:12 PM   CHOL 222 (H) 03/19/2017 12:00 AM   TRIG 62 03/27/2017 08:12 PM   HDL 79 03/27/2017 08:12 PM   HDL 75 03/19/2017 12:00 AM   CHOLHDL 2.8 03/27/2017 08:12 PM   LDLCALC 130 (H) 03/27/2017 08:12 PM   LDLCALC 113 (H) 03/19/2017 12:00 AM   LDLDIRECT 124 (H) 03/19/2017 12:00 AM    Physical Exam:    VS:  BP (!) 152/80   Pulse 92   Ht 5' 2"  (1.575 m)   Wt 128 lb (58.1 kg)   SpO2 94%   BMI 23.41 kg/m     Wt Readings from Last 3 Encounters:  05/13/18 128 lb (58.1 kg)  04/10/18 132 lb 9.6 oz (60.1 kg)  06/01/17 131 lb 6.4 oz (59.6 kg)     Physical Exam  Constitutional: She is oriented to person, place, and time. She appears well-developed and well-nourished. No  distress.  HENT:  Head: Normocephalic and atraumatic.  Neck: Neck supple. No JVD present.  Cardiovascular: Normal rate, regular rhythm, S1 normal and S2 normal.  Murmur heard.  Low-pitched early systolic murmur is present with a grade of 2/6 at the upper right sternal border. Pulmonary/Chest: Effort normal. She has no rales.  Abdominal: Soft. There is no hepatomegaly.  Musculoskeletal: She exhibits no edema.  Neurological: She is alert and oriented to person, place, and time.  Skin: Skin is warm and dry.    ASSESSMENT & PLAN:    Essential hypertension  Blood pressure is  uncontrolled.  She had side effects in the past of beta-blockers.  She seems to have had issues with diarrhea while on angiotensin receptor blocker.  Her diarrheal symptoms improved off of the ARB.  However, she resumed the medication secondary to worsening blood pressure.  I have suggested trying amlodipine.  I did warn her of potential side effects of lower extremity swelling.  She will notify me if she develops any of these symptoms.  -DC losartan  -Start amlodipine 5 mg daily  -Follow-up in hypertension clinic in the next several weeks  Hyperlipidemia, unspecified hyperlipidemia type As noted, she had significantly elevated liver enzymes in 2016 on Lipitor.  She did not try ezetimibe.  She potentially could have had worsening diarrhea while on ezetimibe.  She may be a good candidate for PCSK9 inhibitor therapy.  I will refer her back to the CVRR clinic for further evaluation.  -Arrange lipids and LFTs  -Follow-up in CVRR clinic for blood pressure and lipids  Coronary artery disease involving native coronary artery of native heart without angina pectoris Status post MI followed by CABG in 2016.  Continue aspirin.  She denies angina.  URI We discussed symptomatic management.  I asked her to avoid decongestants as these will make her BP higher (ie - Sudafed, Mucinex-D, etc).  We discussed red flag symptoms (high fever,  cough with yellow or green sputum or blood, shortness of breath, chest pain).  She will seek care at urgent care if she develops worsening or red flag symptoms.    Dispo:  Return in about 11 months (around 04/13/2019) for Routine Follow Up w/ Dr. Tamala Julian.   Medication Adjustments/Labs and Tests Ordered: Current medicines are reviewed at length with the patient today.  Concerns regarding medicines are outlined above.  Tests Ordered: Orders Placed This Encounter  Procedures  . Lipid Profile  . Hepatic function panel  . AMB Referral to [facility]  . EKG 12-Lead   Medication Changes: Meds ordered this encounter  Medications  . amLODipine (NORVASC) 5 MG tablet    Sig: Take 1 tablet (5 mg total) by mouth daily.    Dispense:  90 tablet    Refill:  3    D/C LOSARTAN    Signed, Richardson Dopp, PA-C  05/13/2018 12:42 PM    [facility] [place], [place], [place]  [address] Phone: [phone]; Fax: [phone]

## 2018-05-16 IMAGING — CT CT ANGIO CHEST-ABD-PELV FOR DISSECTION W/ AND WO/W CM
2 of 7 series · 11 of 36 positions shown, 15 images · IV contrast (isovue)
Comparison: 07/23/2015 CT abdomen and pelvis. 07/03/2016 thoracic
spine radiographs

CLINICAL DATA: 76 y/o  F; severe back pain radiating down the arm.

EXAM:
CT ANGIOGRAPHY CHEST, ABDOMEN AND PELVIS
TECHNIQUE: Multidetector CT imaging through the chest, abdomen and pelvis was
performed using the standard protocol during bolus administration of
intravenous contrast. Multiplanar reconstructed images and MIPs were
obtained and reviewed to evaluate the vascular anatomy.
CONTRAST:  100 cc Isovue 370

[Series 8: dissection 2mm · axial · 0.73mm/px · z∈[+818,+1318]mm · 10 of 286 slices shown, 13 images]
[im 18/286  mediastinal]
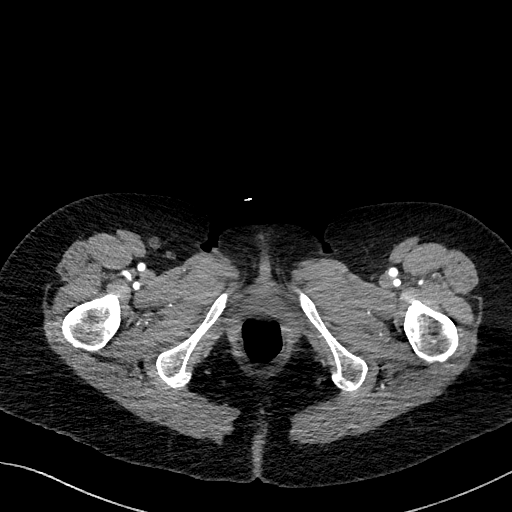
[im 18/286  bone]
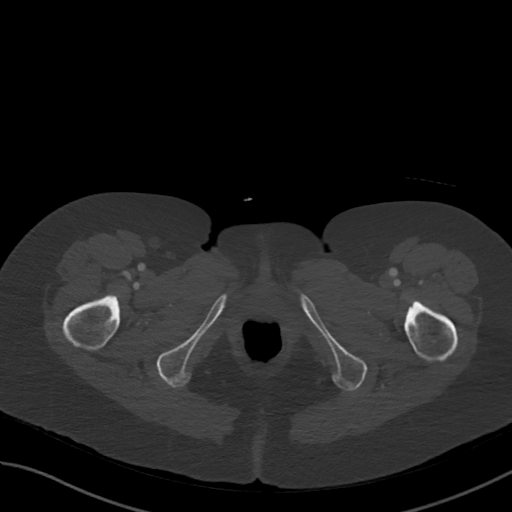
[im 54/286  mediastinal]
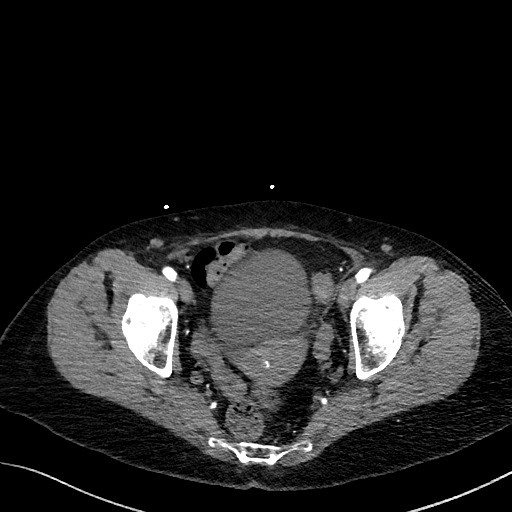
[im 90/286  mediastinal]
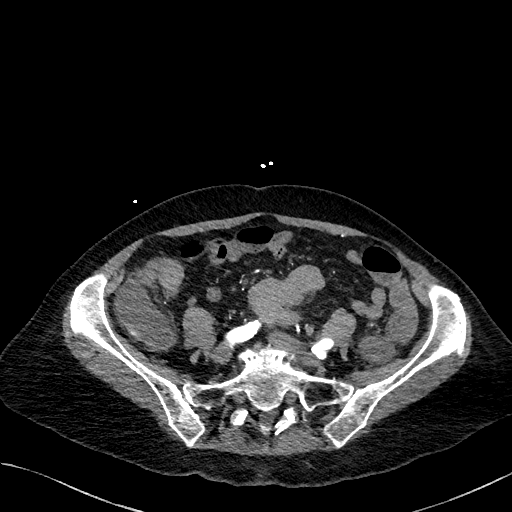
[im 125/286  mediastinal]
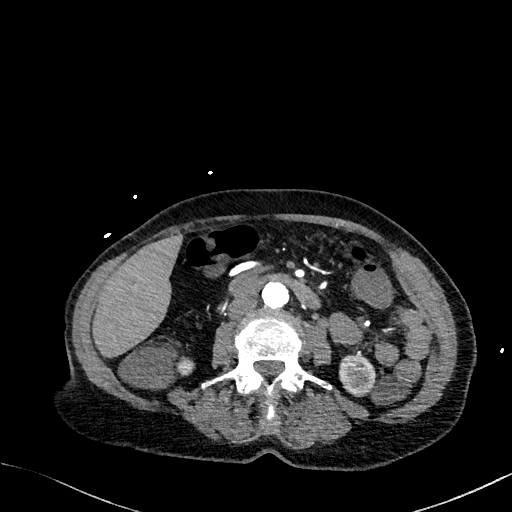
[im 161/286  mediastinal]
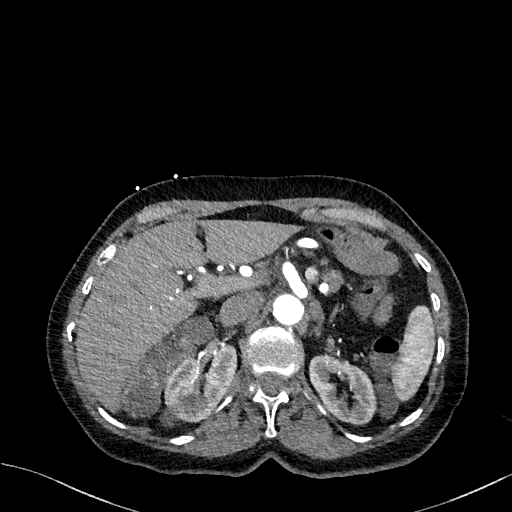
[im 196/286  mediastinal]
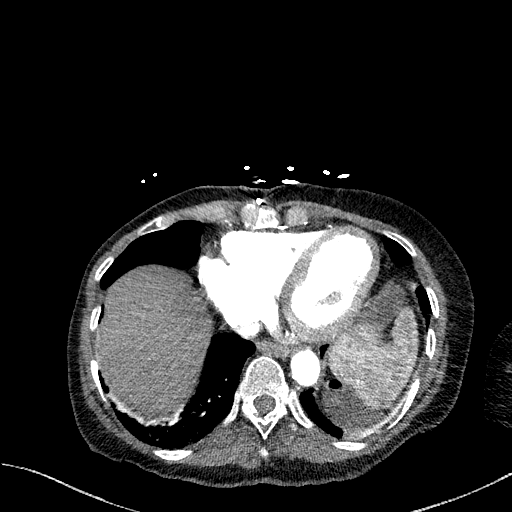
[im 214/286  lung]
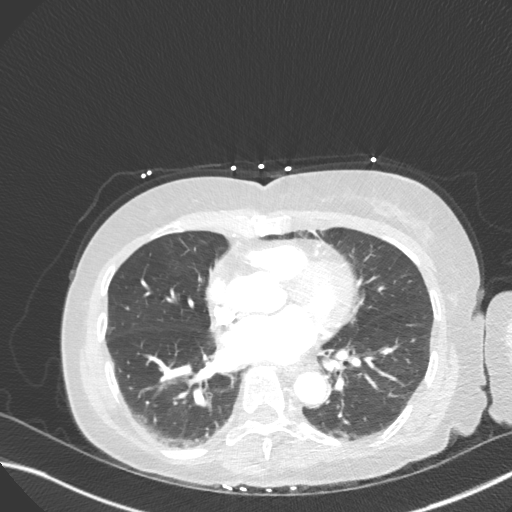
[im 232/286  mediastinal]
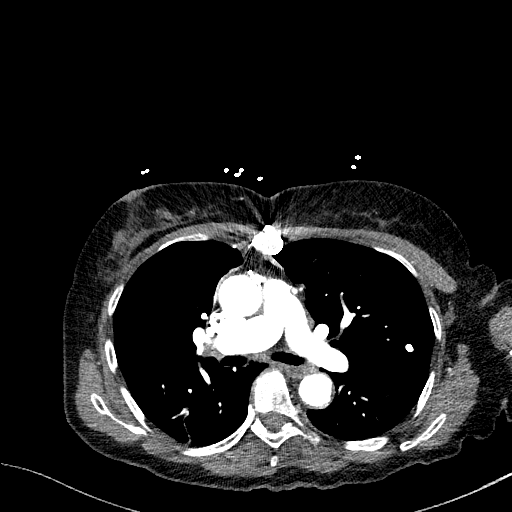
[im 232/286  lung]
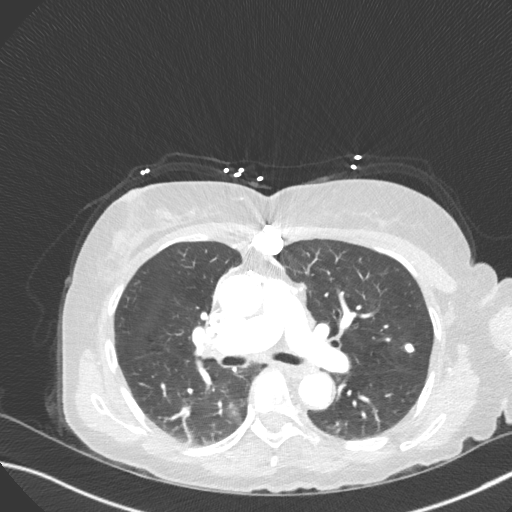
[im 250/286  lung]
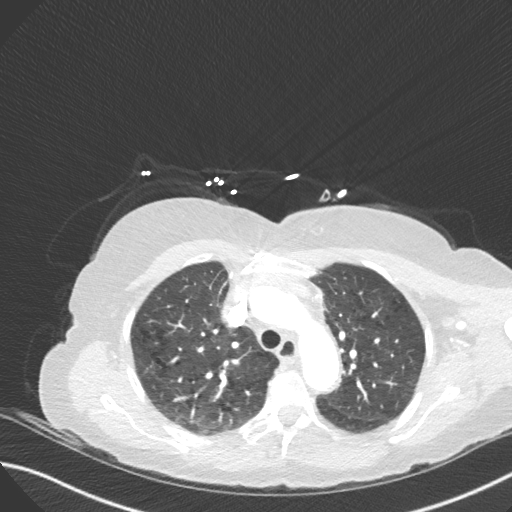
[im 268/286  mediastinal]
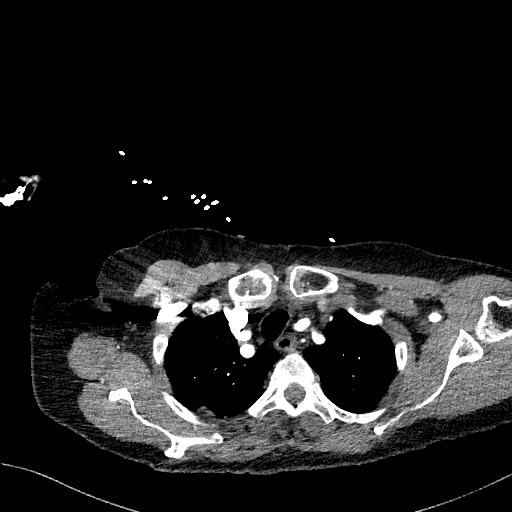
[im 268/286  lung]
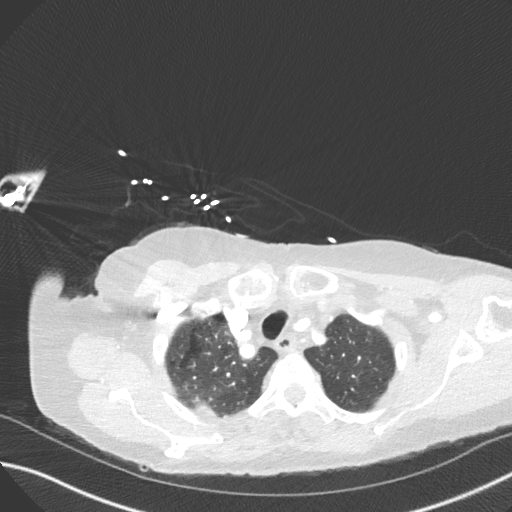

[Series 11: dissection 2mm cor · coronal · 0.64mm/px · 1 of 106 slices shown, 2 images]
[im 53/106  mediastinal]
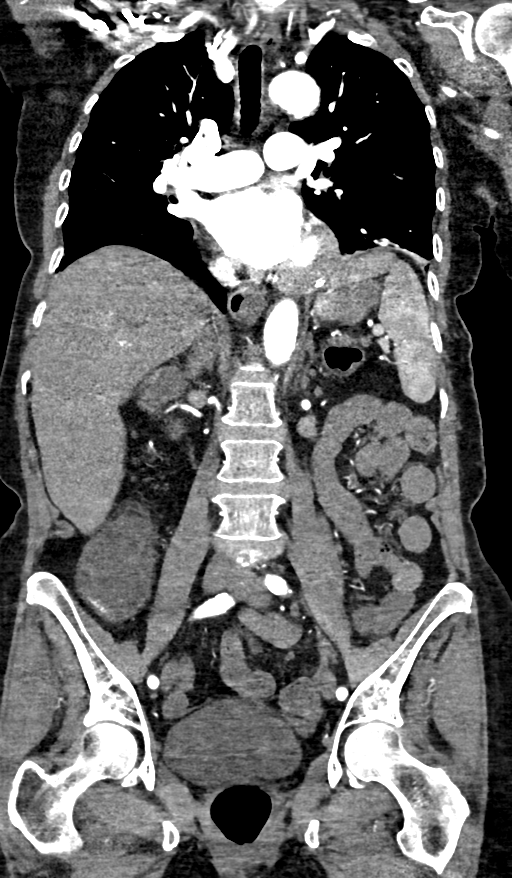
[im 53/106  bone]
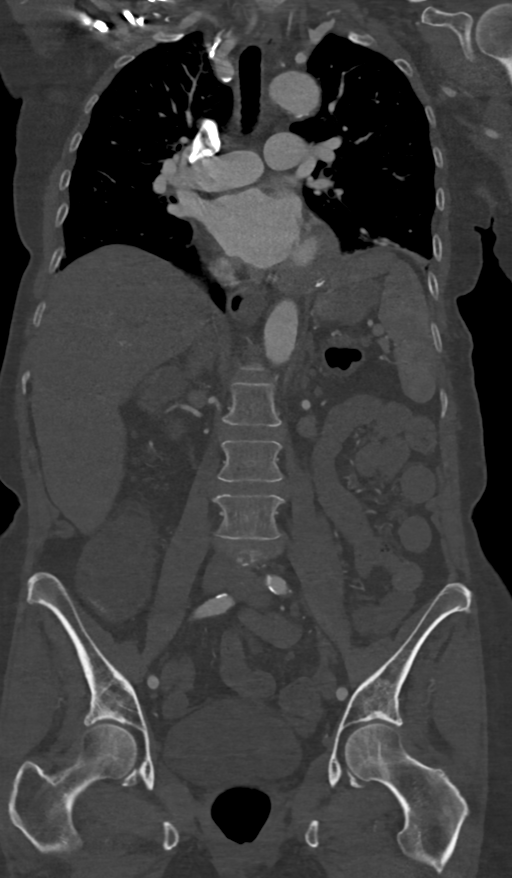

[11 of 36 positions shown; findings below may reference images not displayed]

FINDINGS: CTA CHEST FINDINGS

Cardiovascular: Preferential opacification of the thoracic aorta. No
evidence of thoracic aortic aneurysm or dissection. Mild
cardiomegaly. Status post CABG. No central or lobar pulmonary
embolus. No pericardial effusion.

Mediastinum/Nodes: No enlarged mediastinal, hilar, or axillary lymph
nodes. Thyroid gland, trachea, and esophagus demonstrate no
significant findings.

Lungs/Pleura: Mild centrilobular emphysema of the lung apices.
Multiple calcified granulomas of the lungs. Minor platelike
atelectasis within the lung bases. No consolidation, pleural
effusion, or pneumothorax.

Musculoskeletal: No chest wall abnormality. No acute or significant
osseous findings.

Review of the MIP images confirms the above findings.

CTA ABDOMEN AND PELVIS FINDINGS

VASCULAR

Aorta: Infrarenal abdominal aorta measures up to 2 cm. Moderate to
severe calcific atherosclerosis of the abdominal aorta. No evidence
for dissection.

Celiac: Patent without evidence of aneurysm, dissection, vasculitis
or significant stenosis.

SMA: Patent without evidence of aneurysm, dissection, vasculitis or
significant stenosis.

Renals: Both renal arteries are patent without evidence of aneurysm,
dissection, vasculitis, fibromuscular dysplasia or significant
stenosis.

IMA: Patent without evidence of aneurysm, dissection, vasculitis or
significant stenosis.

Inflow: Patent without evidence of aneurysm, dissection, vasculitis
or significant stenosis.

Veins: No obvious venous abnormality within the limitations of this
arterial phase study.

Review of the MIP images confirms the above findings.

NON-VASCULAR

Hepatobiliary: No focal liver abnormality. Cholelithiasis. No intra
or extrahepatic biliary ductal dilatation.

Pancreas: Unremarkable. No pancreatic ductal dilatation or
surrounding inflammatory changes.

Spleen: Normal in size without focal abnormality.

Adrenals/Urinary Tract: Normal adrenal glands. Right kidney upper
pole cyst measuring 4.2 cm. Right kidney interpolar nonobstructing
caliceal stone measuring 4 mm. No hydronephrosis. Normal bladder.

Stomach/Bowel: Stomach is within normal limits. Appendix appears
normal. No evidence of bowel wall thickening, distention, or
inflammatory changes.

Lymphatic: No lymphadenopathy.

Reproductive: Uterine calcifications without appreciable mass,
likely small underlying myomas. Normal adnexa.

Other: No abdominal wall hernia or abnormality. No abdominopelvic
ascites.

Musculoskeletal: L1 vertebral body moderate anterior compression
deformity is new. Transitional S1 vertebral body. Stable grade 1
L4-5 anterolisthesis. Stable mild S-shaped curvature of the spine.

Review of the MIP images confirms the above findings.
IMPRESSION: 1. L1 vertebral body moderate anterior compression deformity with
slight retropulsion of the superior endplate, new from 07/03/2016.
2. No evidence of aortic dissection. Moderate aortic calcific
atherosclerosis.
3. Mild centrilobular emphysema of the lungs.
4. Mild cardiomegaly.  Status post CABG.
5. Cholelithiasis.
6. Right kidney nonobstructing stone.

By: Tc Coskun Misiedjan M.D.

## 2018-05-27 ENCOUNTER — Other Ambulatory Visit: Payer: Medicare Other | Admitting: *Deleted

## 2018-05-27 DIAGNOSIS — E785 Hyperlipidemia, unspecified: Secondary | ICD-10-CM

## 2018-05-27 LAB — LIPID PANEL
CHOL/HDL RATIO: 3.1 ratio (ref 0.0–4.4)
Cholesterol, Total: 206 mg/dL — ABNORMAL HIGH (ref 100–199)
HDL: 66 mg/dL (ref >39)
LDL CALC: 123 mg/dL — AB (ref 0–99)
Triglycerides: 85 mg/dL (ref 0–149)
VLDL CHOLESTEROL CAL: 17 mg/dL (ref 5–40)

## 2018-05-27 LAB — HEPATIC FUNCTION PANEL
ALBUMIN: 3.8 g/dL (ref 3.5–4.8)
ALT: 12 IU/L (ref 0–32)
AST: 18 IU/L (ref 0–40)
Alkaline Phosphatase: 76 IU/L (ref 39–117)
Bilirubin Total: 0.4 mg/dL (ref 0.0–1.2)
Bilirubin, Direct: 0.11 mg/dL (ref 0.00–0.40)
TOTAL PROTEIN: 6.9 g/dL (ref 6.0–8.5)

## 2018-06-04 ENCOUNTER — Ambulatory Visit (INDEPENDENT_AMBULATORY_CARE_PROVIDER_SITE_OTHER): Payer: Medicare Other | Admitting: Pharmacist

## 2018-06-04 VITALS — BP 132/68 | HR 78

## 2018-06-04 DIAGNOSIS — I10 Essential (primary) hypertension: Secondary | ICD-10-CM

## 2018-06-04 DIAGNOSIS — E7800 Pure hypercholesterolemia, unspecified: Secondary | ICD-10-CM

## 2018-06-04 NOTE — Progress Notes (Signed)
Patient ID: Dana Palmer                 DOB: 05/15/1941                      MRN: [id number]     HPI: Dana Palmer is a 77 y.o. female patient of Dr. Tamala Julian who presents today for hypertension follow up and lipid management. PMH significant for CAD s/p CABG with LIMA to LAD and SVG to diagonal in 2016, HTN, and HLD. She has previously been seen by Fuller [place], PharmD for BP management. She has previously been unable to tolerate statin medications due to altered liver enzymes - ALT increased to 186 and alk phos increased to 900. She never stated zetia due to worry that this would contribute to diarrhea. At her most recent visit with Dr. Tamala Julian her losartan was discontinued due to diarrhea/mucusy stools. She was started on amlodipine.    She presents today for discussion of cholesterol and BP follow up. She reports that she is doing much better on amlodipine than losartan. She states her loose stools are much more tolerable. Her pressure was elevated at last visit due to taking pseudoephedrine for cold. She is no longer using this. She just got back from trip to [place] to check on sister.  She does endorse some dizziness since starting amlodipine. This is not orthostatic mediated and she is unsure if related to blood pressure as usually in evening. She does endorse some LLE yesterday after 12 hours in the car driving back from [place]. Her legs are not swollen this morning.   Current HTN meds:  Amlodipine 47m daily - started about a week ago. Takes in the AM.  Current Lipid Medications: none  Previously tried: lipitor 475mdaily 2016 (turned urine brown and abnormal labs), beta blocker - metoprolol (fecal incontinence and fatigue), zetia 1073maily (never started due to diarrhea), losartan (loose stools)  BP goal: <130/80  Family History: Heart attack in her paternal grandfather; Heart block in her sister; Heart disease in her father; Heart failure in her mother; Hypertension in her sister; Liver cancer in her  mother; Pancreatic disease in her mother; Prostate cancer in her son; Stroke in her sister; Urolithiasis in her father.  Social History: Former smoker 1/2 PPD for 50 years, quit in 2002. Denies alcohol and illicit drug use. Retired nurMarine scientistDiet: Granola or cereal for breakfast, occasionally has eggs. Salad, peanut butter, or yogurt with fruit/nuts for lunch. Dinner - chicken, fish, beef, or pork - lean cuts, does not fry food. Cooks most food at home. Does not cook with salt. Drinks a few cups of coffee each day.   Exercise: Stays active with housework and playing with her grandkids.  Home BP readings: 153/70s upon last check a few days ago with sister's cuff.   Labs:  05/27/18: TC 206, TG 85, HDL 66, LDL 123 (no therapy)  Wt Readings from Last 3 Encounters:  05/13/18 128 lb (58.1 kg)  04/10/18 132 lb 9.6 oz (60.1 kg)  06/01/17 131 lb 6.4 oz (59.6 kg)   BP Readings from Last 3 Encounters:  06/04/18 132/68  05/13/18 (!) 152/80  04/10/18 122/74   Pulse Readings from Last 3 Encounters:  06/04/18 78  05/13/18 92  04/10/18 (!) 101    Renal function: CrCl cannot be calculated (Patient's most recent lab result is older than the maximum 21 days allowed.).  Past Medical History:  Diagnosis Date  .  Remote Anterior Stemi 05/22/2015  . CAD (coronary artery disease)    a. ant-lat STEMI 6/16 >> LHC:  ostial to mid LAD 99% with complex thrombus, mid to dist LAD 99%, D2 80%, prox RCA 50%, mid RCA 50%, EF 35% with ant-apical AK >>  b. s/p CABG (L-LAD, S-Dx)  . Chronic diastolic heart failure ([place]) 02/12/2017  . Elevated LFTs 08/26/2015  . Essential hypertension   . Gallstone   . HLD (hyperlipidemia)   . Hyperlipidemia 06/07/2015  . Ischemic cardiomyopathy    a. echo 6/16:  EF 35%, mild LVH, periapical HK, Gr 2 DD;  b.  Echo 7/16:  Mild LVH, mild focal basal septal hypertrophy, EF 60-65%, no RWMA, trivial AI, MAC, mild increased PASP, trivial effusion  . Osteoporosis   . Pneumonia   .  Psoriasis   . UTI (lower urinary tract infection)     Current Outpatient Medications on File Prior to Visit  Medication Sig Dispense Refill  . amLODipine (NORVASC) 5 MG tablet Take 1 tablet (5 mg total) by mouth daily. 90 tablet 3  . aspirin EC 81 MG tablet Take 81 mg by mouth at bedtime.    . calcipotriene (DOVONOX) 0.005 % cream Apply 1 application topically 2 (two) times daily as needed. (PSORIASIS)    . clobetasol cream (TEMOVATE) 9.44 % Apply 1 application topically 2 (two) times daily as needed (psoriasis).      No current facility-administered medications on file prior to visit.     Allergies  Allergen Reactions  . Avelox [Moxifloxacin] Shortness Of Breath and Other (See Comments)    Irregular heart rate and mucoid stools also  . Humira [Adalimumab] Other (See Comments)    Stopped working/ possible cardiac reaction  . Lipitor [Atorvastatin] Other (See Comments)    Turned urine brown - abnormal labs  . Ultram [Tramadol Hcl] Nausea Only  . Codeine Nausea And Vomiting and Rash  . Methotrexate Derivatives Nausea And Vomiting  . Sulfa Antibiotics Nausea Only and Rash  . Statins Other (See Comments)    Urine turn very dark brown and her labs came back alarming (per the patient)  . Monosodium Glutamate Other (See Comments)    headache  . Sorbitol Diarrhea    Blood pressure 132/68, pulse 78, SpO2 96 %.   Assessment/Plan: Hypertension: BP borderline at goal. Will continue amlodipine 42m daily. Advised to check pressures daily and call if pressure remains consistently above 130/80. Also advised to call with additional issues with swelling.  Hyperlipidemia: LDL not at goal. She had elevated LFTs on statin therapy and does not wish to rechallenge with statin therapy. She is also hesitant about ezetimibe due to GI issues. In addition there is no evidence for ezetimibe monotherapy with CV risk reduction. We will submit for Praluent 747mevery 14 days. Discussed injection technique,  risk/benefit, and side effect profile. We will submit for coverage through patient assistance once approved through insurance.   Thank you, [facility]el[facility]  06/04/2018 10:45 AM

## 2018-06-04 NOTE — Patient Instructions (Signed)
BLOOD PRESSURE  CONTINUE amlodipine 5mg  daily  Your blood pressure goal is less than 130/80  Check your blood pressure at home daily (if able) and keep record of the readings.  Exercise as you're able, try to walk approximately 30 minutes per day.  Keep salt intake to a minimum, especially watch canned and prepared boxed foods.  Eat more fresh fruits and vegetables and fewer canned items.  Avoid eating in fast food restaurants.    HOW TO TAKE YOUR BLOOD PRESSURE: . Rest 5 minutes before taking your blood pressure. .  Don't smoke or drink caffeinated beverages for at least 30 minutes before. . Take your blood pressure before (not after) you eat. . Sit comfortably with your back supported and both feet on the floor (don't cross your legs). . Elevate your arm to heart level on a table or a desk. . Use the proper sized cuff. It should fit smoothly and snugly around your bare upper arm. There should be enough room to slip a fingertip under the cuff. The bottom edge of the cuff should be 1 inch above the crease of the elbow. . Ideally, take 3 measurements at one sitting and record the average.      Cholesterol  We will send for coverage of Praluent injection 75mg  every 14 days through your insurance. We will then process the patient assistance paperwork for coverage. If you hear from the patient assistance about coverage please call our office. [phone].    Cholesterol is a fat. Your body needs a small amount of cholesterol. Cholesterol (plaque) may build up in your blood vessels (arteries). That makes you more likely to have a heart attack or stroke. You cannot feel your cholesterol level. Having a blood test is the only way to find out if your level is high. Keep your test results. Work with your doctor to keep your cholesterol at a good level. What do the results mean?  Total cholesterol is how much cholesterol is in your blood.  LDL is bad cholesterol. This is the type that can  build up. Try to have low LDL.  HDL is good cholesterol. It cleans your blood vessels and carries LDL away. Try to have high HDL.  Triglycerides are fat that the body can store or burn for energy. What are good levels of cholesterol?  Total cholesterol below 200.  LDL below 100 is good for people who have health risks. LDL below 70 is good for people who have very high risks.  HDL above 40 is good. It is best to have HDL of 60 or higher.  Triglycerides below 150. How can I lower my cholesterol? Diet Follow your diet program as told by your doctor.  Choose fish, white meat chicken, or [place] that is roasted or baked. Try not to eat red meat, fried foods, sausage, or lunch meats.  Eat lots of fresh fruits and vegetables.  Choose whole grains, beans, pasta, potatoes, and cereals.  Choose olive oil, corn oil, or canola oil. Only use small amounts.  Try not to eat butter, mayonnaise, shortening, or palm kernel oils.  Try not to eat foods with trans fats.  Choose low-fat or nonfat dairy foods. ? Drink skim or nonfat milk. ? Eat low-fat or nonfat yogurt and cheeses. ? Try not to drink whole milk or cream. ? Try not to eat ice cream, egg yolks, or full-fat cheeses.  Healthy desserts include angel food cake, ginger snaps, animal crackers, hard candy, popsicles, and low-fat or  nonfat frozen yogurt. Try not to eat pastries, cakes, pies, and cookies.  Exercise Follow your exercise program as told by your doctor.  Be more active. Try gardening, walking, and taking the stairs.  Ask your doctor about ways that you can be more active.  Medicine  Take over-the-counter and prescription medicines only as told by your doctor. This information is not intended to replace advice given to you by your health care provider. Make sure you discuss any questions you have with your health care provider. Document Released: 02/09/2009 Document Revised: 06/14/2016 Document Reviewed:  05/25/2016 Elsevier Interactive Patient Education  [employer].

## 2018-06-06 ENCOUNTER — Telehealth: Payer: Self-pay | Admitting: Pharmacist

## 2018-06-06 NOTE — Telephone Encounter (Signed)
Pt approved through insurance for Praluent 75mg . Await Dr. Michaelle CopasSmith's signature for patient assistance paperwork so that can be sent to PASS program.   Once signed will send for patient assistance.

## 2018-06-10 ENCOUNTER — Other Ambulatory Visit: Payer: Self-pay | Admitting: Interventional Cardiology

## 2018-06-10 NOTE — Telephone Encounter (Signed)
Patient Instructions by Tarri FullerFiato, Carol M, CMA at 05/13/2018 12:15 PM   Author: Tarri FullerFiato, Carol M, CMA Author Type: Certified Medical Assistant Filed: 05/13/2018 12:29 PM  Note Status: Signed Cosign: Cosign Not Required Encounter Date: 05/13/2018  Editor: Tarri FullerFiato, Carol M, CMA (Certified Medical Assistant)    Medication Instructions:  1. STOP LOSARTAN   2. START NORVASC 5 MG 1 TABLET DAILY; RX HAS BEEN SENT IN

## 2018-06-13 NOTE — Telephone Encounter (Signed)
This has been faxed. Await decision.

## 2018-06-17 NOTE — Telephone Encounter (Signed)
Pt approved for free medication through PASS program.   Spoke with patient and made aware of approval. She will contact PASS program for shipment. She is aware to call once she has started medication so that labs can be ordered.   She also reports that she has had some swelling on her lower legs over the weekend. She is unsure if this is related to the medication or the heat or her being on her feet more with moving. Advised that could be a combination of things. She states the swelling is not overly uncomfortable and does go away if she elevates feet/ after sleeping. Advised to monitor and call if still having issues next Monday. She states understanding.

## 2018-07-10 ENCOUNTER — Telehealth: Payer: Self-pay | Admitting: Pharmacist

## 2018-07-10 DIAGNOSIS — E7800 Pure hypercholesterolemia, unspecified: Secondary | ICD-10-CM

## 2018-07-10 NOTE — Telephone Encounter (Signed)
Spoke with patient about her Praluent injections. She recently had cataract surgery and was waiting to finish her eyedrops before starting the injections. She sees her eye surgeon this Friday (07/12/18) and plans to start the injections that day once he clears her. She knows that she needs follow-up labs and plans to call on Friday after her appointment with her surgeon to schedule a lab visit.

## 2018-07-17 NOTE — Addendum Note (Signed)
Addended by: Levin BaconAUTEN, Westlynn Fifer M on: 07/17/2018 12:52 PM   Modules accepted: Orders

## 2018-07-17 NOTE — Telephone Encounter (Signed)
Pt called to report that she started the Praluent this morning. Labs scheduled for after her fourth dose of the medication and the medication was added to her profile.

## 2018-07-31 NOTE — Telephone Encounter (Signed)
Pt called about Praluent. She started another antibiotic eye drop (cipro). Advised ok with Praluent. She also questions if issue with healing. Advised that while Praluent is MAB would not anticipate any effect on healing. She states understanding.

## 2018-09-02 ENCOUNTER — Other Ambulatory Visit: Payer: Medicare Other | Admitting: *Deleted

## 2018-09-02 DIAGNOSIS — E7800 Pure hypercholesterolemia, unspecified: Secondary | ICD-10-CM

## 2018-09-02 LAB — HEPATIC FUNCTION PANEL
ALBUMIN: 4.1 g/dL (ref 3.5–4.8)
ALT: 11 IU/L (ref 0–32)
AST: 18 IU/L (ref 0–40)
Alkaline Phosphatase: 79 IU/L (ref 39–117)
BILIRUBIN TOTAL: 0.4 mg/dL (ref 0.0–1.2)
BILIRUBIN, DIRECT: 0.11 mg/dL (ref 0.00–0.40)
TOTAL PROTEIN: 7.1 g/dL (ref 6.0–8.5)

## 2018-09-02 LAB — LIPID PANEL
CHOL/HDL RATIO: 2 ratio (ref 0.0–4.4)
Cholesterol, Total: 139 mg/dL (ref 100–199)
HDL: 71 mg/dL (ref >39)
LDL Calculated: 53 mg/dL (ref 0–99)
Triglycerides: 75 mg/dL (ref 0–149)
VLDL Cholesterol Cal: 15 mg/dL (ref 5–40)

## 2018-09-05 ENCOUNTER — Telehealth: Payer: Self-pay | Admitting: Pharmacist

## 2018-09-05 NOTE — Telephone Encounter (Signed)
-----   Message from Tarri Fuller, CMA sent at 09/04/2018  4:29 PM EDT ----- Regarding: Pralulent refill  Hi Ladies,  So I was helping Dana Palmer with her results and s/w pt today. Pt states to me that she had taken the Pralulent out of the refrigerator. While waiting for Pralulent to reach room temp she fell asleep and woke up a few hours later. She states she accidentally injected into her thumb and immediately pulled the needle out. She states she then did take her injection like she was supposed to however now she is going to be short one Pralulent and will need a refill. I advised pt I will have Pharm D call her . Pt thanked me for my help.  Thank you Okey Regal

## 2018-09-05 NOTE — Telephone Encounter (Signed)
Spoke with patient who will come to get a replacement pen on Tuesday.

## 2018-10-28 ENCOUNTER — Telehealth: Payer: Self-pay | Admitting: Pharmacist

## 2018-10-28 NOTE — Telephone Encounter (Signed)
Pt called about reauthorization through pass program for Praluent. She will come to office today to fill out her part of the application.

## 2018-10-29 ENCOUNTER — Telehealth: Payer: Self-pay | Admitting: Interventional Cardiology

## 2018-10-29 NOTE — Telephone Encounter (Signed)
New Message          Patient is calling to give you some information for some paper work that you are filling out for her. Pls call and advise

## 2018-10-30 NOTE — Telephone Encounter (Signed)
Spoke with patient and income info provided for paperwork for PASS program. Will complete application and fax once complete. Pt aware.

## 2018-11-15 ENCOUNTER — Telehealth: Payer: Self-pay | Admitting: Pharmacist

## 2018-11-15 NOTE — Telephone Encounter (Signed)
Called pt and left message on voicemail to call us back Letting pt know that we have submitted everything to the safety net [employer] and are just waiting to hear back. There is nothing further she needs to do at this time

## 2018-12-11 ENCOUNTER — Telehealth: Payer: Self-pay | Admitting: *Deleted

## 2018-12-12 NOTE — Telephone Encounter (Signed)
   [facility] Pre-operative Risk Assessment    Request for surgical clearance:  1. What type of surgery is being performed? DENTAL SURGERY; 4 TEETH BEING EXTRACTED   2. When is this surgery scheduled? TBD   3. What type of clearance is required (medical clearance vs. Pharmacy clearance to hold med vs. Both)? MEDICAL  4. Are there any medications that need to be held prior to surgery and how long?PER DR. Maudie Mercury NO NEED TO HOLD ASA   5. Practice name and name of physician performing surgery? [facility] ; DR. Sheppard Coil KIM  6. What is your office phone number [phone]    7.   What is your office fax number [phone]  8.   Anesthesia type (None, local, MAC, general) ? LOCAL   Dana Palmer 12/12/2018, 10:04 AM  _________________________________________________________________   (provider comments below)

## 2018-12-18 NOTE — Telephone Encounter (Signed)
   Primary Cardiologist: Lesleigh Noe, MD  Chart reviewed and patient interviewed over the phone today as part of pre-operative protocol coverage. Based on ACC/AHA guidelines, Dana Palmer would be at acceptable risk for the planned procedure without further cardiovascular testing.   No need for SBE prophylaxis.   I will route this recommendation to the requesting party via Epic fax function and remove from pre-op pool.  Please call with questions.  Corine Shelter, PA-C 12/18/2018, 8:45 AM

## 2019-03-21 ENCOUNTER — Telehealth: Payer: Self-pay | Admitting: Interventional Cardiology

## 2019-03-21 NOTE — Telephone Encounter (Signed)
Spoke with pt and she is agreeable to change 5/4 appt to a virtual visit. She is willing to try Doximity method and if that fails switch over to phone visit.  Advised pt someone would call her next week to go over some details with her.

## 2019-03-28 ENCOUNTER — Telehealth: Payer: Self-pay

## 2019-03-28 NOTE — Telephone Encounter (Signed)
YOUR CARDIOLOGY TEAM HAS ARRANGED FOR AN E-VISIT FOR YOUR APPOINTMENT - PLEASE REVIEW IMPORTANT INFORMATION BELOW SEVERAL DAYS PRIOR TO YOUR APPOINTMENT  Due to the recent COVID-19 pandemic, we are transitioning in-person office visits to tele-medicine visits in an effort to decrease unnecessary exposure to our patients, their families, and staff. These visits are billed to your insurance just like a normal visit is. We also encourage you to sign up for MyChart if you have not already done so. You will need a smartphone if possible. For patients that do not have this, we can still complete the visit using a regular telephone but do prefer a smartphone to enable video when possible. You may have a family member that lives with you that can help. If possible, we also ask that you have a blood pressure cuff and scale at home to measure your blood pressure, heart rate and weight prior to your scheduled appointment. Patients with clinical needs that need an in-person evaluation and testing will still be able to come to the office if absolutely necessary. If you have any questions, feel free to call our office.     YOUR PROVIDER WILL BE USING THE FOLLOWING PLATFORM TO COMPLETE YOUR VISIT: Doximity  . IF USING MYCHART - How to Download the MyChart App to Your SmartPhone   - If Apple, go to App Store and type in MyChart in the search bar and download the app. If Android, ask patient to go to Google Play Store and type in MyChart in the search bar and download the app. The app is free but as with any other app downloads, your phone may require you to verify saved payment information or Apple/Android password.  - You will need to then log into the app with your MyChart username and password, and select [facility] as your healthcare provider to link the account.  - When it is time for your visit, go to the MyChart app, find appointments, and click Begin Video Visit. Be sure to Select Allow for your device to  access the Microphone and Camera for your visit. You will then be connected, and your provider will be with you shortly.  **If you have any issues connecting or need assistance, please contact MyChart service desk [phone] ([phone])**  **If using a computer, in order to ensure the best quality for your visit, you will need to use either of the following Internet Browsers: Google Chrome or Microsoft Edge**  . IF USING DOXIMITY or [email or web address].ME - The staff will give you instructions on receiving your link to join the meeting the day of your visit.      2-3 DAYS BEFORE YOUR APPOINTMENT  You will receive a telephone call from one of our [facility] team members - your caller ID may say "Unknown caller." If this is a video visit, we will walk you through how to get the video launched on your phone. We will remind you check your blood pressure, heart rate and weight prior to your scheduled appointment. If you have an Apple Watch or Kardia, please upload any pertinent ECG strips the day before or morning of your appointment to MyChart. Our staff will also make sure you have reviewed the consent and agree to move forward with your scheduled tele-health visit.     THE DAY OF YOUR APPOINTMENT  Approximately 15 minutes prior to your scheduled appointment, you will receive a telephone call from one of Heart[facility] team - your caller ID may say "Unknown caller."    Our staff will confirm medications, vital signs for the day and any symptoms you may be experiencing. Please have this information available prior to the time of visit start. It may also be helpful for you to have a pad of paper and pen handy for any instructions given during your visit. They will also walk you through joining the smartphone meeting if this is a video visit.    CONSENT FOR TELE-HEALTH VISIT - PLEASE REVIEW  I hereby voluntarily request, consent and authorize [facility] and its employed or contracted physicians, physician  assistants, nurse practitioners or other licensed health care professionals (the Practitioner), to provide me with telemedicine health care services (the "Services") as deemed necessary by the treating Practitioner. I acknowledge and consent to receive the Services by the Practitioner via telemedicine. I understand that the telemedicine visit will involve communicating with the Practitioner through live audiovisual communication technology and the disclosure of certain medical information by electronic transmission. I acknowledge that I have been given the opportunity to request an in-person assessment or other available alternative prior to the telemedicine visit and am voluntarily participating in the telemedicine visit.  I understand that I have the right to withhold or withdraw my consent to the use of telemedicine in the course of my care at any time, without affecting my right to future care or treatment, and that the Practitioner or I may terminate the telemedicine visit at any time. I understand that I have the right to inspect all information obtained and/or recorded in the course of the telemedicine visit and may receive copies of available information for a reasonable fee.  I understand that some of the potential risks of receiving the Services via telemedicine include:  . Delay or interruption in medical evaluation due to technological equipment failure or disruption; . Information transmitted may not be sufficient (e.g. poor resolution of images) to allow for appropriate medical decision making by the Practitioner; and/or  . In rare instances, security protocols could fail, causing a breach of personal health information.  Furthermore, I acknowledge that it is my responsibility to provide information about my medical history, conditions and care that is complete and accurate to the best of my ability. I acknowledge that Practitioner's advice, recommendations, and/or decision may be based on  factors not within their control, such as incomplete or inaccurate data provided by me or distortions of diagnostic images or specimens that may result from electronic transmissions. I understand that the practice of medicine is not an exact science and that Practitioner makes no warranties or guarantees regarding treatment outcomes. I acknowledge that I will receive a copy of this consent concurrently upon execution via email to the email address I last provided but may also request a printed copy by calling the office of [facility].    I understand that my insurance will be billed for this visit.   I have read or had this consent read to me. . I understand the contents of this consent, which adequately explains the benefits and risks of the Services being provided via telemedicine.  . I have been provided ample opportunity to ask questions regarding this consent and the Services and have had my questions answered to my satisfaction. . I give my informed consent for the services to be provided through the use of telemedicine in my medical care  By participating in this telemedicine visit I agree to the above.  

## 2019-03-30 NOTE — Progress Notes (Signed)
Virtual Visit via Video Note   This visit type was conducted due to national recommendations for restrictions regarding the COVID-19 Pandemic (e.g. social distancing) in an effort to limit this patient's exposure and mitigate transmission in our community.  Due to her co-morbid illnesses, this patient is at least at moderate risk for complications without adequate follow up.  This format is felt to be most appropriate for this patient at this time.  All issues noted in this document were discussed and addressed.  A limited physical exam was performed with this format.  Please refer to the patient's chart for her consent to telehealth for [facility].   Date:  03/30/2019   ID:  Dana Palmer, DOB [birth year], MRN [id number]  Patient Location: Home Provider Location: Office  PCP:  Patient, No Pcp Per  Cardiologist:  Sinclair Grooms, MD  Electrophysiologist:  None   Evaluation Performed:  Follow-Up Visit  Chief Complaint:  CAD/Lipids  History of Present Illness:    Dana Palmer is a 78 y.o. female with CAD, CABGwith LIMA to LAD and SVG to diagonal 0630, acute systolic heart failure resolved to normalEFgreater than 60% 2016,hypertension,and hyperlipidemia.  Feels well and is working in her garden which is both physically and psychologically helpful.  She denies angina.  She had difficulty getting Praluent in the winter but is now back on therapy since March.  Praluent does work with LDL cholesterol less than 60 when checked in January.  No shortness of breath, skin reaction to injections, headache, dyspnea, or significant complaints.  The patient does not have symptoms concerning for COVID-19 infection (fever, chills, cough, or new shortness of breath).    Past Medical History:  Diagnosis Date  .  Remote Anterior Stemi 05/22/2015  . CAD (coronary artery disease)    a. ant-lat STEMI 6/16 >> LHC:  ostial to mid LAD 99% with complex thrombus, mid to dist LAD 99%, D2 80%, prox RCA  50%, mid RCA 50%, EF 35% with ant-apical AK >>  b. s/p CABG (L-LAD, S-Dx)  . Chronic diastolic heart failure ([place]) 02/12/2017  . Elevated LFTs 08/26/2015  . Essential hypertension   . Gallstone   . HLD (hyperlipidemia)   . Hyperlipidemia 06/07/2015  . Ischemic cardiomyopathy    a. echo 6/16:  EF 35%, mild LVH, periapical HK, Gr 2 DD;  b.  Echo 7/16:  Mild LVH, mild focal basal septal hypertrophy, EF 60-65%, no RWMA, trivial AI, MAC, mild increased PASP, trivial effusion  . Osteoporosis   . Pneumonia   . Psoriasis   . UTI (lower urinary tract infection)    Past Surgical History:  Procedure Laterality Date  . CARDIAC CATHETERIZATION N/A 05/22/2015   Procedure: Left Heart Cath and Coronary Angiography;  Surgeon: Belva Crome, MD;  Location: [place] [facility];  Service: Cardiovascular;  Laterality: N/A;  . CARPAL TUNNEL RELEASE    . CORONARY ARTERY BYPASS GRAFT    . CORONARY ARTERY BYPASS GRAFT N/A 05/22/2015   Procedure: CORONARY ARTERY BYPASS GRAFTING (CABG);  Surgeon: Melrose Nakayama, MD;  Location: [place];  Service: Open Heart Surgery;  Laterality: N/A;  . TEE WITHOUT CARDIOVERSION  05/22/2015   Procedure: TRANSESOPHAGEAL ECHOCARDIOGRAM (TEE);  Surgeon: Melrose Nakayama, MD;  Location: [place];  Service: Open Heart Surgery;;  . TONSILLECTOMY    . TUBAL LIGATION  1971  . WRIST FRACTURE SURGERY Bilateral 11/26/2008   Open reduction     No outpatient medications have been marked as taking  for the 03/31/19 encounter (Appointment) with Belva Crome, MD.     Allergies:   Avelox [moxifloxacin]; Humira [adalimumab]; Lipitor [atorvastatin]; Ultram [tramadol hcl]; Codeine; Methotrexate derivatives; Sulfa antibiotics; Statins; Monosodium glutamate; and Sorbitol   Social History   Tobacco Use  . Smoking status: Former Smoker    Packs/day: 0.50    Years: 50.00    Pack years: 25.00    Types: Cigarettes    Last attempt to quit: 05/21/2001    Years since quitting: 17.8  . Smokeless  tobacco: Never Used  Substance Use Topics  . Alcohol use: No    Alcohol/week: 0.0 standard drinks    Comment: 4x/yr  . Drug use: No     Family Hx: The patient's family history includes Arthritis in her son; Cancer in her paternal grandmother; Heart attack in her paternal grandfather; Heart block in her sister; Heart disease in her father; Heart failure in her mother; Hypertension in her sister; Liver cancer in her mother; Pancreatic disease in her mother; Prostate cancer in her son; Rheum arthritis in her sister; Stroke in her sister; Urolithiasis in her father.  ROS:   Please see the history of present illness.    No specific complaints. All other systems reviewed and are negative.   Prior CV studies:   The following studies were reviewed today:  No new data  Labs/Other Tests and Data Reviewed:    EKG:  No ECG reviewed.  Recent Labs: 09/02/2018: ALT 11   Recent Lipid Panel Lab Results  Component Value Date/Time   CHOL 139 09/02/2018 09:53 AM   TRIG 75 09/02/2018 09:53 AM   HDL 71 09/02/2018 09:53 AM   CHOLHDL 2.0 09/02/2018 09:53 AM   CHOLHDL 2.8 03/27/2017 08:12 PM   LDLCALC 53 09/02/2018 09:53 AM   LDLDIRECT 124 (H) 03/19/2017 12:00 AM    Wt Readings from Last 3 Encounters:  05/13/18 128 lb (58.1 kg)  04/10/18 132 lb 9.6 oz (60.1 kg)  06/01/17 131 lb 6.4 oz (59.6 kg)     Objective:    Vital Signs:  There were no vitals taken for this visit.   VITAL SIGNS:  reviewed GEN:  no acute distress RESPIRATORY:  normal respiratory effort, symmetric expansion CARDIOVASCULAR:  no peripheral edema NEURO:  alert and oriented x 3, no obvious focal deficit  ASSESSMENT & PLAN:    1. Chronic diastolic heart failure ([place])   2. 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   3. Hyperlipidemia, unspecified hyperlipidemia type   4. Essential hypertension   5. 2019 novel coronavirus disease (COVID-19)    PLAN:  1. No clinical evidence of volume overload.   Diastolic heart failure was a problem only acutely upon presentation with LAD thrombotic lesion. 2. Secondary risk prevention discussed in detail.  Laboratory testing including lipids will be done in June. 3. Target LDL less than 70 4. Target blood pressure less than 130/80 mmHg discussed  Overall education and awareness concerning primary/secondary risk prevention was discussed in detail: LDL less than 70, hemoglobin A1c less than 7, blood pressure target less than 130/80 mmHg, >150 minutes of moderate aerobic activity per week, avoidance of smoking, weight control (via diet and exercise), and continued surveillance/management of/for obstructive sleep apnea.   COVID-19 Education: The signs and symptoms of COVID-19 were discussed with the patient and how to seek care for testing (follow up with PCP or arrange E-visit).  The importance of social distancing was discussed today.  Time:   Today, I have  spent 15 minutes with the patient with telehealth technology discussing the above problems.     Medication Adjustments/Labs and Tests Ordered: Current medicines are reviewed at length with the patient today.  Concerns regarding medicines are outlined above.   Tests Ordered: No orders of the defined types were placed in this encounter.   Medication Changes: No orders of the defined types were placed in this encounter.   Disposition:  Follow up in 1 year(s)  Signed, Sinclair Grooms, MD  03/30/2019 6:20 PM    [facility]

## 2019-03-31 ENCOUNTER — Encounter: Payer: Self-pay | Admitting: Interventional Cardiology

## 2019-03-31 ENCOUNTER — Telehealth (INDEPENDENT_AMBULATORY_CARE_PROVIDER_SITE_OTHER): Payer: Medicare Other | Admitting: Interventional Cardiology

## 2019-03-31 ENCOUNTER — Other Ambulatory Visit: Payer: Self-pay

## 2019-03-31 VITALS — BP 131/73 | HR 74 | Temp 97.4°F | Ht 62.0 in | Wt 130.2 lb

## 2019-03-31 DIAGNOSIS — I25810 Atherosclerosis of coronary artery bypass graft(s) without angina pectoris: Secondary | ICD-10-CM

## 2019-03-31 DIAGNOSIS — U071 COVID-19: Secondary | ICD-10-CM

## 2019-03-31 DIAGNOSIS — I5032 Chronic diastolic (congestive) heart failure: Secondary | ICD-10-CM | POA: Diagnosis not present

## 2019-03-31 DIAGNOSIS — E785 Hyperlipidemia, unspecified: Secondary | ICD-10-CM

## 2019-03-31 DIAGNOSIS — I10 Essential (primary) hypertension: Secondary | ICD-10-CM

## 2019-03-31 NOTE — Patient Instructions (Signed)
Medication Instructions:  Your physician recommends that you continue on your current medications as directed. Please refer to the Current Medication list given to you today.  If you need a refill on your cardiac medications before your next appointment, please call your pharmacy.   Lab work: Your physician recommends that you return for lab work in: June.  You will need to be fasting for these labs (nothing to eat or drink after midnight except water and black coffee).  If you have labs (blood work) drawn today and your tests are completely normal, you will receive your results only by: Marland Kitchen MyChart Message (if you have MyChart) OR . A paper copy in the mail If you have any lab test that is abnormal or we need to change your treatment, we will call you to review the results.  Testing/Procedures: None  Follow-Up: At [facility], you and your health needs are our priority.  As part of our continuing mission to provide you with exceptional heart care, we have created designated Provider Care Teams.  These Care Teams include your primary Cardiologist (physician) and Advanced Practice Providers (APPs -  Physician Assistants and Nurse Practitioners) who all work together to provide you with the care you need, when you need it. You will need a follow up appointment in 12 months.  Please call our office 2 months in advance to schedule this appointment.  You may see Lesleigh Noe, MD or one of the following Advanced Practice Providers on your designated Care Team:   Norma Fredrickson, NP Nada Boozer, NP . Georgie Chard, NP  Any Other Special Instructions Will Be Listed Below (If Applicable).

## 2019-05-20 ENCOUNTER — Telehealth: Payer: Self-pay | Admitting: Interventional Cardiology

## 2019-05-20 NOTE — Telephone Encounter (Signed)
New Message    Patient has lab appt tomorrow and was calling in to answer Covid prescreening questions.

## 2019-05-20 NOTE — Telephone Encounter (Signed)

## 2019-05-21 ENCOUNTER — Other Ambulatory Visit: Payer: Self-pay

## 2019-05-21 ENCOUNTER — Other Ambulatory Visit: Payer: Medicare Other | Admitting: *Deleted

## 2019-05-21 DIAGNOSIS — E785 Hyperlipidemia, unspecified: Secondary | ICD-10-CM

## 2019-05-21 DIAGNOSIS — I5032 Chronic diastolic (congestive) heart failure: Secondary | ICD-10-CM

## 2019-05-21 DIAGNOSIS — I10 Essential (primary) hypertension: Secondary | ICD-10-CM

## 2019-05-21 DIAGNOSIS — I25810 Atherosclerosis of coronary artery bypass graft(s) without angina pectoris: Secondary | ICD-10-CM

## 2019-05-22 LAB — BASIC METABOLIC PANEL
BUN/Creatinine Ratio: 15 (ref 12–28)
BUN: 12 mg/dL (ref 8–27)
CO2: 26 mmol/L (ref 20–29)
Calcium: 9.4 mg/dL (ref 8.7–10.3)
Chloride: 106 mmol/L (ref 96–106)
Creatinine, Ser: 0.8 mg/dL (ref 0.57–1.00)
GFR calc Af Amer: 82 mL/min/{1.73_m2} (ref >59)
GFR calc non Af Amer: 71 mL/min/{1.73_m2} (ref >59)
Glucose: 88 mg/dL (ref 65–99)
Potassium: 3.5 mmol/L (ref 3.5–5.2)
Sodium: 145 mmol/L — ABNORMAL HIGH (ref 134–144)

## 2019-05-22 LAB — HEPATIC FUNCTION PANEL
ALT: 11 IU/L (ref 0–32)
AST: 17 IU/L (ref 0–40)
Albumin: 4.2 g/dL (ref 3.7–4.7)
Alkaline Phosphatase: 82 IU/L (ref 39–117)
Bilirubin Total: 0.4 mg/dL (ref 0.0–1.2)
Bilirubin, Direct: 0.12 mg/dL (ref 0.00–0.40)
Total Protein: 7 g/dL (ref 6.0–8.5)

## 2019-05-22 LAB — LIPID PANEL
Chol/HDL Ratio: 2 ratio (ref 0.0–4.4)
Cholesterol, Total: 164 mg/dL (ref 100–199)
HDL: 84 mg/dL (ref >39)
LDL Calculated: 65 mg/dL (ref 0–99)
Triglycerides: 77 mg/dL (ref 0–149)
VLDL Cholesterol Cal: 15 mg/dL (ref 5–40)

## 2019-05-22 LAB — LIPOPROTEIN A (LPA): Lipoprotein (a): 22.1 nmol/L (ref <75.0)

## 2019-05-25 ENCOUNTER — Other Ambulatory Visit: Payer: Self-pay | Admitting: Physician Assistant

## 2019-05-26 ENCOUNTER — Telehealth: Payer: Self-pay | Admitting: *Deleted

## 2019-05-26 NOTE — Telephone Encounter (Signed)
Result Notes for Basic metabolic panel  Notes recorded by Michae Kava, CMA on 05/26/2019 at 9:58 AM EDT  Pt has been notified of lab results by phone with verbal understanding. Pt thanked me for the call. I will send this to the Lipid Clinic Pharm-D as well as FYI per Dr. Tamala Julian to monitor in [facility]. The patient has been notified of the result and verbalized understanding. All questions (if any) were answered.  Julaine Hua, [place] 05/26/2019 9:58 AM   ------   Notes recorded by Belva Crome, MD on 05/24/2019 at 12:20 PM EDT  Let the patient know labs are excellent. Same therapy. Monitor in lipid clinic.  A copy will be sent to Patient, No Pcp Per  ------

## 2020-05-03 NOTE — Progress Notes (Signed)
Cardiology Office Note   Date:  05/07/2020   ID:  Dana Palmer, DOB Mar 23, 1941, MRN [id number]  PCP:  Patient, No Pcp Per  Cardiologist:  Dr. Tamala Julian   Chief Complaint  Patient presents with  . Follow-up    History of Present Illness: Dana Palmer is a 79 y.o. female who presents for 1 year follow-up, seen by Dr. Tamala Julian.  [name] has a history of CAD s/p CABGwith LIMA to LAD and SVG to diagonal 2016, acute on chronic systolic heart failure with normalization of EF to greater than 60% in 2016,hypertension,and hyperlipidemia.  04/2015 patient presented with chest pain and underwent LHC which showed ostial LAD thrombotic lesion obstructing the vessel to 95% and complex segmental 90% mid LAD disease. A branch of the large second diagonal is also severely disease but probably not graftable. Widely patent circumflex. Moderate right coronary disease. Acute systolic heart failure with anteroapical akinesis and suspected stunned anterior wall.>> CT surgery was consulted with plans for revascularization as above.  Echocardiogram at that time showed an LVEF of 35% with peri-apical severe hypokinesis.  On repeat echocardiogram 05/2015 EF had normalized to 60 to 65% with no regional wall motion abnormalities and G1 DD.  She was last seen by Dr. Tamala Julian 03/2019 and was doing well without anginal symptoms.  She continued to work in her garden without CV symptoms.  She did have difficulty getting Praluent however was back on therapy since 01/2019.  Today [name] reports she is doing well from a CV standpoint.  She has no anginal symptoms, shortness of breath, palpitations, LE edema, orthopnea, PND, dizziness or syncope.  She reports that she was in communication with pharmacy team regarding Praluent therapy (previously on with great results and no side effects) however due to change in insurance paperwork was to be resubmitted however she was never contacted after completion.  Discussed follow-up on  this.  She would like to be placed back on Praluent.  She has no PCP therefore we discussed obtaining today.  She is asking about coming off of ASA as she feels she has a reaction with mucoid stools.  Reports previously discussed with Dr. Tamala Julian however ASA discontinuation was deferred given significant history of CAD.  Agreed.  I encouraged her to obtain a PCP for further work-up of the GI symptoms.  We will draw labs today.  Past Medical History:  Diagnosis Date  .  Remote Anterior Stemi 05/22/2015  . CAD (coronary artery disease)    a. ant-lat STEMI 6/16 >> LHC:  ostial to mid LAD 99% with complex thrombus, mid to dist LAD 99%, D2 80%, prox RCA 50%, mid RCA 50%, EF 35% with ant-apical AK >>  b. s/p CABG (L-LAD, S-Dx)  . Chronic diastolic heart failure ([place]) 02/12/2017  . Elevated LFTs 08/26/2015  . Essential hypertension   . Gallstone   . HLD (hyperlipidemia)   . Hyperlipidemia 06/07/2015  . Ischemic cardiomyopathy    a. echo 6/16:  EF 35%, mild LVH, periapical HK, Gr 2 DD;  b.  Echo 7/16:  Mild LVH, mild focal basal septal hypertrophy, EF 60-65%, no RWMA, trivial AI, MAC, mild increased PASP, trivial effusion  . Osteoporosis   . Pneumonia   . Psoriasis   . UTI (lower urinary tract infection)     Past Surgical History:  Procedure Laterality Date  . CARDIAC CATHETERIZATION N/A 05/22/2015   Procedure: Left Heart Cath and Coronary Angiography;  Surgeon: Belva Crome, MD;  [facility]: [facility]  INVASIVE CV [facility];  Service: Cardiovascular;  Laterality: N/A;  . CARPAL TUNNEL RELEASE    . CORONARY ARTERY BYPASS GRAFT    . CORONARY ARTERY BYPASS GRAFT N/A 05/22/2015   Procedure: CORONARY ARTERY BYPASS GRAFTING (CABG);  Surgeon: Melrose Nakayama, MD;  Location: [place];  Service: Open Heart Surgery;  Laterality: N/A;  . TEE WITHOUT CARDIOVERSION  05/22/2015   Procedure: TRANSESOPHAGEAL ECHOCARDIOGRAM (TEE);  Surgeon: Melrose Nakayama, MD;  Location: [place];  Service: Open Heart Surgery;;  .  TONSILLECTOMY    . TUBAL LIGATION  1971  . WRIST FRACTURE SURGERY Bilateral 11/26/2008   Open reduction     Current Outpatient Medications  Medication Sig Dispense Refill  . amLODipine (NORVASC) 5 MG tablet TAKE 1 TABLET BY MOUTH EVERY DAY 90 tablet 2  . aspirin EC 81 MG tablet Take 81 mg by mouth at bedtime.    . calcipotriene (DOVONOX) 0.005 % cream Apply 1 application topically 2 (two) times daily as needed. (PSORIASIS)    . clobetasol cream (TEMOVATE) 2.77 % Apply 1 application topically 2 (two) times daily as needed (psoriasis).      No current facility-administered medications for this visit.    Allergies:   Avelox [moxifloxacin], Humira [adalimumab], Lipitor [atorvastatin], Ultram [tramadol hcl], Codeine, Methotrexate derivatives, Sulfa antibiotics, Statins, Monosodium glutamate, and Sorbitol    Social History:  The patient  reports that she quit smoking about 18 years ago. Her smoking use included cigarettes. She has a 25.00 pack-year smoking history. She has never used smokeless tobacco. She reports that she does not drink alcohol and does not use drugs.   Family History:  The patient's family history includes Arthritis in her son; Cancer in her paternal grandmother; Heart attack in her paternal grandfather; Heart block in her sister; Heart disease in her father; Heart failure in her mother; Hypertension in her sister; Liver cancer in her mother; Pancreatic disease in her mother; Prostate cancer in her son; Rheum arthritis in her sister; Stroke in her sister; Urolithiasis in her father.    ROS:  Please see the history of present illness. Otherwise, review of systems are positive for none.   All other systems are reviewed and negative.    PHYSICAL EXAM: VS:  BP 120/80   Pulse 72   Ht _0  (1.575 m)   Wt 127 lb 6.4 oz (57.8 kg)   SpO2 97%   BMI 23.30 kg/m  , BMI Body mass index is 23.3 kg/m.   General: Well developed, well nourished, NAD Neck: Negative for carotid  bruits. No JVD Lungs:Clear to ausculation bilaterally. No wheezes, rales, or rhonchi. Breathing is unlabored. Cardiovascular: RRR with S1 S2. No murmur Extremities: No edema. No clubbing or cyanosis. Radial  bilaterally Neuro: Alert and oriented. No focal deficits. No facial asymmetry. MAE spontaneously. Psych: Responds to questions appropriately with normal affect.     EKG:  EKG is ordered today. The ekg ordered today demonstrates NSR with no acute changes from prior tracing   Recent Labs: 05/21/2019: ALT 11; BUN 12; Creatinine, Ser 0.80; Potassium 3.5; Sodium 145    Lipid Panel    Component Value Date/Time   CHOL 164 05/21/2019 1005   TRIG 77 05/21/2019 1005   HDL 84 05/21/2019 1005   CHOLHDL 2.0 05/21/2019 1005   CHOLHDL 2.8 03/27/2017 2012   VLDL 12 03/27/2017 2012   LDLCALC 65 05/21/2019 1005   LDLDIRECT 124 (H) 03/19/2017 0000      Wt Readings from Last 3  Encounters:  05/07/20 127 lb 6.4 oz (57.8 kg)  03/31/19 130 lb 3.2 oz (59.1 kg)  05/13/18 128 lb (58.1 kg)     Other studies Reviewed: Additional studies/ records that were reviewed today include:  Review of the above records demonstrates:    [facility] 05/23/2015:   Ostial LAD thrombotic lesion obstructing the vessel to 95% and complex segmental 90% mid LAD disease. A branch of the large second diagonal is also severely disease but probably not graftable.  Widely patent circumflex. Moderate right coronary disease.  Acute systolic heart failure with anteroapical akinesis and suspected stunned anterior wall.   RECOMMENDATIONS:   After consulting with Dr. Roxan Hockey, reviewing images, considering the clinical presentation, we agreed that bypass grafting with LIMA to LAD and vein graft to diagonal is the best approach. PCI on the LAD could be complicated by embolization.  Echocardiogram 05/23/2015:  Study Conclusions   - Left ventricle: The cavity size was normal. Wall thickness was  increased in a pattern  of mild LVH. The estimated ejection  fraction was 35%. Severe hypokinesis of the peri-apical segments.  Features are consistent with a pseudonormal left ventricular  filling pattern, with concomitant abnormal relaxation and  increased filling pressure (grade 2 diastolic dysfunction).  - Aortic valve: Poorly visualized. There was no stenosis.  - Mitral valve: Mildly calcified annulus.  - Right ventricle: Poorly visualized.  - Pulmonary arteries: No complete TR doppler jet so unable to  estimate PA systolic pressure.  - Inferior vena cava: The vessel was normal in size. The  respirophasic diameter changes were in the normal range (>= 50%),  consistent with normal central venous pressure.  - Pericardium, extracardiac: A trivial pericardial effusion was  identified.   Impressions:   - Technically difficult study with very poor windows. EF estimated  at 35% with peri-apical severe hypokinesis. RV not visualized.   Transthoracic echocardiography. M-mode, limited 2D, spectral  Doppler, and color Doppler. Birthdate: Patient birthdate:  Apr 25, 1941. Age: Patient is 79 yr old. Sex: Gender: female.  Blood pressure:   119/73 Patient status: Inpatient. Study  date: Study date: 05/23/2015. Study time: 09:16 AM. Location:  ICU/CCU   Echocardiogram 05/2015:  Study Conclusions   - Left ventricle: The cavity size was normal. Wall thickness was  increased in a pattern of mild LVH. There was mild focal basal  hypertrophy of the septum. Systolic function was normal. The  estimated ejection fraction was in the range of 60% to 65%. Wall  motion was normal; there were no regional wall motion  abnormalities. Doppler parameters are consistent with abnormal  left ventricular relaxation (grade 1 diastolic dysfunction).  - Aortic valve: There was trivial regurgitation.  - Mitral valve: Calcified annulus.  - Pulmonary arteries: Systolic pressure was mildly  increased.  - Pericardium, extracardiac: A trivial pericardial effusion was  identified.   ASSESSMENT AND PLAN:  1.  Chronic systolic and diastolic CHF: -Issue only during acute MI phase with normalization of LVEF to greater than 60% and no recurrence -Appears euvolemic on exam -Continue current regimen  2.  CAD s/p CABG 2016: -Denies anginal symptoms -Continue secondary prevention with ASA>> asking to come off secondary to GI symptoms however deferred  3.  HLD: -Last LDL, 65 on 05/21/2019 however has not been on Praluent for approximately 6 months and is interested in reinitiating this -We will contact lipid clinic team for further review -LDL goal <70  4.  Hypertension: -Stable, 120/80 -No changes  5.  Mucoid stools: -Reports this is  secondary to ASA therapy -Encouraged obtaining PCP for further GI work-up -We will obtain a CBC today however she reports no bleeding in stool or urine    Current medicines are reviewed at length with the patient today.  The patient does not have concerns regarding medicines.  The following changes have been made:  no change  Labs/ tests ordered today include: CMET, lipid, CBC  Orders Placed This Encounter  Procedures  . CBC  . Lipid panel  . Comprehensive metabolic panel  . EKG 12-Lead     Disposition:   FU with Dr. Tamala Julian in 1 year  Signed, Kathyrn Drown, NP  05/07/2020 10:46 AM    [address], [place], [place]  [address] Phone: [phone]; Fax: [phone]

## 2020-05-07 ENCOUNTER — Encounter: Payer: Self-pay | Admitting: Cardiology

## 2020-05-07 ENCOUNTER — Telehealth: Payer: Self-pay | Admitting: Pharmacist

## 2020-05-07 ENCOUNTER — Ambulatory Visit (INDEPENDENT_AMBULATORY_CARE_PROVIDER_SITE_OTHER): Payer: Medicare Other | Admitting: Cardiology

## 2020-05-07 ENCOUNTER — Other Ambulatory Visit: Payer: Self-pay

## 2020-05-07 VITALS — BP 120/80 | HR 72 | Ht 62.0 in | Wt 127.4 lb

## 2020-05-07 DIAGNOSIS — E7800 Pure hypercholesterolemia, unspecified: Secondary | ICD-10-CM

## 2020-05-07 DIAGNOSIS — I10 Essential (primary) hypertension: Secondary | ICD-10-CM | POA: Diagnosis not present

## 2020-05-07 DIAGNOSIS — E785 Hyperlipidemia, unspecified: Secondary | ICD-10-CM

## 2020-05-07 DIAGNOSIS — I5032 Chronic diastolic (congestive) heart failure: Secondary | ICD-10-CM

## 2020-05-07 DIAGNOSIS — I25810 Atherosclerosis of coronary artery bypass graft(s) without angina pectoris: Secondary | ICD-10-CM

## 2020-05-07 MED ORDER — PRALUENT 75 MG/ML ~~LOC~~ SOAJ: 1.0000 "pen " | SUBCUTANEOUS | 11 refills | Status: DC

## 2020-05-07 NOTE — Telephone Encounter (Signed)
Called pt to discuss Praluent assistance. She states she called into the office in December 2020 and left a message regarding PASS Praluent application. Do not have any records of this. Pt has been without Praluent for 6 months. Prior authorization is still active through 06/04/21.  Called pt and applied for [employer] which was approved. Praluent sent to pharmacy, provided them with grant info and confirmed $0 copay. Pt is aware and will resume injections. Scheduled follow up labs in 3 months to assess efficacy.

## 2020-05-07 NOTE — Patient Instructions (Addendum)
Medication Instructions:   Your physician recommends that you continue on your current medications as directed. Please refer to the Current Medication list given to you today.  *If you need a refill on your cardiac medications before your next appointment, please call your pharmacy*  Lab Work:  Your physician recommends that you return for lab work on 05/10/20  If you have labs (blood work) drawn today and your tests are completely normal, you will receive your results only by: Marland Kitchen MyChart Message (if you have MyChart) OR . A paper copy in the mail If you have any lab test that is abnormal or we need to change your treatment, we will call you to review the results.  Testing/Procedures:  None ordered today  Follow-Up: At [facility], you and your health needs are our priority.  As part of our continuing mission to provide you with exceptional heart care, we have created designated Provider Care Teams.  These Care Teams include your primary Cardiologist (physician) and Advanced Practice Providers (APPs -  Physician Assistants and Nurse Practitioners) who all work together to provide you with the care you need, when you need it.  We recommend signing up for the patient portal called "MyChart".  Sign up information is provided on this After Visit Summary.  MyChart is used to connect with patients for Virtual Visits (Telemedicine).  Patients are able to view lab/test results, encounter notes, upcoming appointments, etc.  Non-urgent messages can be sent to your provider as well.   To learn more about what you can do with MyChart, go to [email or web address].    Your next appointment:   12 month(s)  The format for your next appointment:   In Person  Provider:   You may see Lesleigh Noe, MD or one of the following Advanced Practice Providers on your designated Care Team:    Norma Fredrickson, NP  Nada Boozer, NP  Georgie Chard, NP

## 2020-05-10 ENCOUNTER — Other Ambulatory Visit: Payer: Self-pay | Admitting: Nurse Practitioner

## 2020-05-10 ENCOUNTER — Other Ambulatory Visit: Payer: Self-pay

## 2020-05-10 ENCOUNTER — Other Ambulatory Visit: Payer: Medicare Other | Admitting: *Deleted

## 2020-05-10 DIAGNOSIS — I10 Essential (primary) hypertension: Secondary | ICD-10-CM

## 2020-05-10 DIAGNOSIS — E876 Hypokalemia: Secondary | ICD-10-CM

## 2020-05-10 DIAGNOSIS — I25810 Atherosclerosis of coronary artery bypass graft(s) without angina pectoris: Secondary | ICD-10-CM

## 2020-05-10 DIAGNOSIS — E7800 Pure hypercholesterolemia, unspecified: Secondary | ICD-10-CM

## 2020-05-10 DIAGNOSIS — I5032 Chronic diastolic (congestive) heart failure: Secondary | ICD-10-CM

## 2020-05-10 DIAGNOSIS — E785 Hyperlipidemia, unspecified: Secondary | ICD-10-CM

## 2020-05-10 LAB — COMPREHENSIVE METABOLIC PANEL
ALT: 8 IU/L (ref 0–32)
AST: 16 IU/L (ref 0–40)
Albumin/Globulin Ratio: 1.3 (ref 1.2–2.2)
Albumin: 4 g/dL (ref 3.7–4.7)
Alkaline Phosphatase: 77 IU/L (ref 48–121)
BUN/Creatinine Ratio: 16 (ref 12–28)
BUN: 12 mg/dL (ref 8–27)
Bilirubin Total: 0.2 mg/dL (ref 0.0–1.2)
CO2: 26 mmol/L (ref 20–29)
Calcium: 8.7 mg/dL (ref 8.7–10.3)
Chloride: 103 mmol/L (ref 96–106)
Creatinine, Ser: 0.74 mg/dL (ref 0.57–1.00)
GFR calc Af Amer: 90 mL/min/{1.73_m2} (ref >59)
GFR calc non Af Amer: 78 mL/min/{1.73_m2} (ref >59)
Globulin, Total: 3 g/dL (ref 1.5–4.5)
Glucose: 89 mg/dL (ref 65–99)
Potassium: 3.2 mmol/L — ABNORMAL LOW (ref 3.5–5.2)
Sodium: 142 mmol/L (ref 134–144)
Total Protein: 7 g/dL (ref 6.0–8.5)

## 2020-05-10 LAB — LIPID PANEL
Chol/HDL Ratio: 3 ratio (ref 0.0–4.4)
Cholesterol, Total: 211 mg/dL — ABNORMAL HIGH (ref 100–199)
HDL: 71 mg/dL (ref >39)
LDL Chol Calc (NIH): 126 mg/dL — ABNORMAL HIGH (ref 0–99)
Triglycerides: 77 mg/dL (ref 0–149)
VLDL Cholesterol Cal: 14 mg/dL (ref 5–40)

## 2020-05-10 LAB — CBC
Hematocrit: 38.7 % (ref 34.0–46.6)
Hemoglobin: 12.3 g/dL (ref 11.1–15.9)
MCH: 26.5 pg — ABNORMAL LOW (ref 26.6–33.0)
MCHC: 31.8 g/dL (ref 31.5–35.7)
MCV: 83 fL (ref 79–97)
Platelets: 285 10*3/uL (ref 150–450)
RBC: 4.64 x10E6/uL (ref 3.77–5.28)
RDW: 12.6 % (ref 11.7–15.4)
WBC: 5.8 10*3/uL (ref 3.4–10.8)

## 2020-05-13 ENCOUNTER — Telehealth: Payer: Self-pay

## 2020-05-13 NOTE — Telephone Encounter (Signed)
Called the pt back to let them know that I made a mistake and to disregard that last message and that we will process the pa for praluent 75

## 2020-05-13 NOTE — Telephone Encounter (Signed)
Called and lmomed the pt to let them know that their insurance prefers repatha and to call us back if it is ok to switch

## 2020-05-16 ENCOUNTER — Other Ambulatory Visit: Payer: Self-pay | Admitting: Physician Assistant

## 2020-05-17 ENCOUNTER — Other Ambulatory Visit: Payer: Self-pay

## 2020-05-17 ENCOUNTER — Other Ambulatory Visit: Payer: Medicare Other | Admitting: *Deleted

## 2020-05-17 DIAGNOSIS — E876 Hypokalemia: Secondary | ICD-10-CM

## 2020-05-17 LAB — BASIC METABOLIC PANEL
BUN/Creatinine Ratio: 18 (ref 12–28)
BUN: 14 mg/dL (ref 8–27)
CO2: 24 mmol/L (ref 20–29)
Calcium: 9.3 mg/dL (ref 8.7–10.3)
Chloride: 106 mmol/L (ref 96–106)
Creatinine, Ser: 0.78 mg/dL (ref 0.57–1.00)
GFR calc Af Amer: 84 mL/min/{1.73_m2} (ref >59)
GFR calc non Af Amer: 73 mL/min/{1.73_m2} (ref >59)
Glucose: 87 mg/dL (ref 65–99)
Potassium: 3.8 mmol/L (ref 3.5–5.2)
Sodium: 145 mmol/L — ABNORMAL HIGH (ref 134–144)

## 2020-06-05 ENCOUNTER — Ambulatory Visit (INDEPENDENT_AMBULATORY_CARE_PROVIDER_SITE_OTHER): Payer: Medicare Other

## 2020-06-05 ENCOUNTER — Ambulatory Visit (HOSPITAL_COMMUNITY)
Admission: EM | Admit: 2020-06-05 | Discharge: 2020-06-05 | Disposition: A | Discharge status: Home / Self Care | Payer: Medicare Other | Attending: Emergency Medicine | Admitting: Emergency Medicine

## 2020-06-05 ENCOUNTER — Other Ambulatory Visit: Payer: Self-pay

## 2020-06-05 ENCOUNTER — Encounter (HOSPITAL_COMMUNITY): Payer: Self-pay | Admitting: Emergency Medicine

## 2020-06-05 DIAGNOSIS — S2231XA Fracture of one rib, right side, initial encounter for closed fracture: Secondary | ICD-10-CM

## 2020-06-05 DIAGNOSIS — R0781 Pleurodynia: Secondary | ICD-10-CM | POA: Diagnosis not present

## 2020-06-05 NOTE — ED Triage Notes (Signed)
Pt c/o fall on Tuesday. She states she was reaching up to change the carbon monoxide monitor and fell and hit her chest on a dresser corner. She is having right sided rib pain and trouble laying flat without pain. She is denying any SOB. 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

## 2020-06-05 NOTE — Discharge Instructions (Signed)
Use of your incentive spirometer every hour will be very important.  Tylenol or ibuprofen for pain, bracing with a pillow with coughing or sneezing.  You have a trace pleural effusion already, so these things are very important to prevent a development of pneumonia.

## 2020-06-06 NOTE — ED Provider Notes (Signed)
[facility]    CSN: [id number] Arrival date & time: 06/05/20  1436      History   Chief Complaint Chief Complaint  Patient presents with  . Fall    HPI Dana Palmer is a 79 y.o. female.   Brittney Mucha presents with complaints of right lateral rib pain. Morning of 7/6 she was standing on a chair reaching to change a CO monitor, lost her footing and fell, landing on the edge of a dresser on her right chest wall. Pain since. Worse with lifting, twisting or turning. Laying increases pain as well. No pain at rest. No shortness of breath , no chest pain . Has had fractured ribs in the past, which felt similar. No fevers. Hasn't taken any medications for pain, states pain is tolerable and does not wish to take any medications, just wants to ensure lungs are healthy and without trauma. History of CABG    ROS per HPI, negative if not otherwise mentioned.      Past Medical History:  Diagnosis Date  .  Remote Anterior Stemi 05/22/2015  . CAD (coronary artery disease)    a. ant-lat STEMI 6/16 >> LHC:  ostial to mid LAD 99% with complex thrombus, mid to dist LAD 99%, D2 80%, prox RCA 50%, mid RCA 50%, EF 35% with ant-apical AK >>  b. s/p CABG (L-LAD, S-Dx)  . Chronic diastolic heart failure ([place]) 02/12/2017  . Elevated LFTs 08/26/2015  . Essential hypertension   . Gallstone   . HLD (hyperlipidemia)   . Hyperlipidemia 06/07/2015  . Ischemic cardiomyopathy    a. echo 6/16:  EF 35%, mild LVH, periapical HK, Gr 2 DD;  b.  Echo 7/16:  Mild LVH, mild focal basal septal hypertrophy, EF 60-65%, no RWMA, trivial AI, MAC, mild increased PASP, trivial effusion  . Osteoporosis   . Pneumonia   . Psoriasis   . UTI (lower urinary tract infection)     Patient Active Problem List   Diagnosis Date Noted  . Chronic diastolic heart failure ([place]) 02/12/2017  . Elevated LFTs 08/26/2015  . 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 Essential hypertension 06/07/2015  . Hyperlipidemia 06/07/2015  .  Remote Anterior Stemi 05/22/2015  . Psoriasis 05/22/2015    Past Surgical History:  Procedure Laterality Date  . CARDIAC CATHETERIZATION N/A 05/22/2015   Procedure: Left Heart Cath and Coronary Angiography;  Surgeon: Belva Crome, MD;  Location: [facility];  Service: Cardiovascular;  Laterality: N/A;  . CARPAL TUNNEL RELEASE    . CORONARY ARTERY BYPASS GRAFT    . CORONARY ARTERY BYPASS GRAFT N/A 05/22/2015   Procedure: CORONARY ARTERY BYPASS GRAFTING (CABG);  Surgeon: Melrose Nakayama, MD;  Location: [facility];  Service: Open Heart Surgery;  Laterality: N/A;  . TEE WITHOUT CARDIOVERSION  05/22/2015   Procedure: TRANSESOPHAGEAL ECHOCARDIOGRAM (TEE);  Surgeon: Melrose Nakayama, MD;  Location: [facility];  Service: Open Heart Surgery;;  . TONSILLECTOMY    . TUBAL LIGATION  1971  . WRIST FRACTURE SURGERY Bilateral 11/26/2008   Open reduction    OB History   No obstetric history on file.      Home Medications    Prior to Admission medications   Medication Sig Start Date End Date Taking? Authorizing Provider  Alirocumab (PRALUENT) 75 MG/ML SOAJ Inject 1 pen into the skin every 14 (fourteen) days. 05/07/20  Yes Belva Crome,  MD  amLODipine (NORVASC) 5 MG tablet TAKE 1 TABLET BY MOUTH EVERY DAY 05/17/20  Yes Belva Crome, MD  aspirin EC 81 MG tablet Take 81 mg by mouth at bedtime.   Yes [provider]  calcipotriene (DOVONOX) 0.005 % cream Apply 1 application topically 2 (two) times daily as needed. (PSORIASIS)   Yes [provider]  clobetasol cream (TEMOVATE) 5.97 % Apply 1 application topically 2 (two) times daily as needed (psoriasis).    Yes [provider]    Family History Family History  Problem Relation Age of Onset  . Heart disease Father   . Urolithiasis Father   . Pancreatic disease Mother   . Heart failure Mother   . Liver  cancer Mother   . Cancer Paternal Grandmother   . Heart attack Paternal Grandfather   . Heart block Sister   . Rheum arthritis Sister   . Hypertension Sister   . Stroke Sister   . Prostate cancer Son   . Arthritis Son        Psoriatic    Social History Social History   Tobacco Use  . Smoking status: Former Smoker    Packs/day: 0.50    Years: 50.00    Pack years: 25.00    Types: Cigarettes    Quit date: 05/21/2001    Years since quitting: 19.0  . Smokeless tobacco: Never Used  Vaping Use  . Vaping Use: Never used  Substance Use Topics  . Alcohol use: No    Alcohol/week: 0.0 standard drinks    Comment: 4x/yr  . Drug use: No     Allergies   Avelox [moxifloxacin], Humira [adalimumab], Lipitor [atorvastatin], Ultram [tramadol hcl], Codeine, Methotrexate derivatives, Sulfa antibiotics, Statins, Monosodium glutamate, and Sorbitol   Review of Systems Review of Systems   Physical Exam Triage Vital Signs ED Triage Vitals  Enc Vitals Group     BP 06/05/20 1501 129/80     Pulse Rate 06/05/20 1501 85     Resp 06/05/20 1501 16     Temp 06/05/20 1501 98.6 F (37 C)     Temp Source 06/05/20 1501 Oral     SpO2 06/05/20 1501 96 %     Weight --      Height --      Head Circumference --      Peak Flow --      Pain Score 06/05/20 1457 3     Pain Loc --      Pain Edu? --      Excl. in [place]? --    No data found.  Updated Vital Signs BP 129/80 (BP Location: Left Arm)   Pulse 85   Temp 98.6 F (37 C) (Oral)   Resp 16   SpO2 96%   Visual Acuity Right Eye Distance:   Left Eye Distance:   Bilateral Distance:    Right Eye Near:   Left Eye Near:    Bilateral Near:     Physical Exam Constitutional:      General: She is not in acute distress.    Appearance: She is well-developed.  Cardiovascular:     Rate and Rhythm: Normal rate and regular rhythm.  Pulmonary:     Effort: Pulmonary effort is normal.     Breath sounds: Normal breath sounds.  Chest:        Comments: Point tenderness to right lateral ribs; no visible bruising present; full ROM of trunk and upper extremities; no shortness of breath  Or difficulty breathing noted ; lungs clear Skin:    General: Skin is warm and dry.  Neurological:     Mental Status: She is alert and oriented to person, place, and time.      UC Treatments / Results  Labs (all labs ordered are listed, but only abnormal results are displayed) Labs Reviewed - No data to display  EKG   Radiology DG Ribs Unilateral W/Chest Right  Result Date: 06/05/2020 CLINICAL DATA:  [place], right rib pain, trauma 4 days ago EXAM: RIGHT RIBS AND CHEST - 3+ VIEW COMPARISON:  03/27/2017 FINDINGS: Frontal and oblique views of the right thoracic cage are obtained. There is a minimally displaced right anterior eighth rib fracture. No other acute bony abnormalities. Cardiac silhouette is unremarkable. No airspace disease or pneumothorax. Trace right pleural effusion is noted. IMPRESSION: 1. Minimally displaced right anterior eighth rib fracture, with trace associated right pleural effusion. No pneumothorax. Electronically Signed   By: Randa Ngo M.D.   On: 06/05/2020 16:02    Procedures Procedures (including critical care time)  Medications Ordered in UC Medications - No data to display  Initial Impression / Assessment and Plan / UC Course  I have reviewed the triage vital signs and the nursing notes.  Pertinent labs & imaging results that were available during my care of the patient were reviewed by me and considered in my medical decision making (see chart for details).     Rib fracture on xray with associated right pleural effusion, no pneumothorax. Incentive spirometer provided and discussion and education on cough, deep breathing provided and emphasized. Patient declines additional pain medications at this time. Strict return precautions provided. Patient verbalized understanding and agreeable to plan.  Ambulatory out of  clinic without difficulty.    Final Clinical Impressions(s) / UC Diagnoses   Final diagnoses:  Closed fracture of one rib of right side, initial encounter     Discharge Instructions     Use of your incentive spirometer every hour will be very important.  Tylenol or ibuprofen for pain, bracing with a pillow with coughing or sneezing.  You have a trace pleural effusion already, so these things are very important to prevent a development of pneumonia.    ED Prescriptions    None     PDMP not reviewed this encounter.   Zigmund Gottron, NP 06/06/20 1344

## 2020-06-28 ENCOUNTER — Other Ambulatory Visit: Payer: Self-pay

## 2020-06-28 ENCOUNTER — Other Ambulatory Visit: Payer: Medicare Other

## 2020-06-28 DIAGNOSIS — E785 Hyperlipidemia, unspecified: Secondary | ICD-10-CM

## 2020-06-28 LAB — HEPATIC FUNCTION PANEL
ALT: 8 IU/L (ref 0–32)
AST: 14 IU/L (ref 0–40)
Albumin: 3.9 g/dL (ref 3.7–4.7)
Alkaline Phosphatase: 80 IU/L (ref 48–121)
Bilirubin Total: 0.4 mg/dL (ref 0.0–1.2)
Bilirubin, Direct: 0.11 mg/dL (ref 0.00–0.40)
Total Protein: 7 g/dL (ref 6.0–8.5)

## 2020-06-28 LAB — LIPID PANEL
Chol/HDL Ratio: 2 ratio (ref 0.0–4.4)
Cholesterol, Total: 153 mg/dL (ref 100–199)
HDL: 75 mg/dL (ref >39)
LDL Chol Calc (NIH): 65 mg/dL (ref 0–99)
Triglycerides: 64 mg/dL (ref 0–149)
VLDL Cholesterol Cal: 13 mg/dL (ref 5–40)

## 2020-11-07 ENCOUNTER — Other Ambulatory Visit: Payer: Self-pay | Admitting: Interventional Cardiology

## 2020-12-21 ENCOUNTER — Telehealth: Payer: Self-pay | Admitting: Pharmacist

## 2020-12-21 MED ORDER — PRALUENT 75 MG/ML ~~LOC~~ SOAJ: 1.0000 "pen " | SUBCUTANEOUS | 3 refills | Status: DC

## 2020-12-21 NOTE — Telephone Encounter (Signed)
Prior authorization approved through 06/20/21 (similar to approval date we already had on file). Will send in refill to pharmacy. Pt was very appreciative for assistance.

## 2020-12-21 NOTE — Telephone Encounter (Signed)
Pt called clinic stating Praluent prior authorization is needed. Per our records, she has an active approval on file through July 2022 but will try resubmitting another request anyway.

## 2021-07-08 ENCOUNTER — Other Ambulatory Visit: Payer: Self-pay | Admitting: Interventional Cardiology

## 2021-09-01 ENCOUNTER — Other Ambulatory Visit: Payer: Self-pay | Admitting: Interventional Cardiology

## 2021-09-15 ENCOUNTER — Ambulatory Visit: Payer: Medicare Other | Admitting: Cardiology

## 2021-10-11 NOTE — Progress Notes (Signed)
Office Visit    Patient Name: Dana Palmer Date of Encounter: 10/12/2021  PCP:  Patient, No Pcp Per (Inactive)   [facility]  Cardiologist:  Sinclair Grooms, MD  Advanced Practice Provider:  No care team member to display Electrophysiologist:  None    Chief Complaint    Mackenze Grandison is a 80 y.o. female with a hx of CAD s/p CABG with LIMA to LAD and SVG to diagonal in 2016, acute on chronic hearth failure with normalization of EF greater then 60% in 2016, hypertension and hyperlipidemia presents today for annual follow-up.    Past Medical History    Past Medical History:  Diagnosis Date    Remote Anterior Stemi 05/22/2015   CAD (coronary artery disease)    a. ant-lat STEMI 6/16 >> LHC:  ostial to mid LAD 99% with complex thrombus, mid to dist LAD 99%, D2 80%, prox RCA 50%, mid RCA 50%, EF 35% with ant-apical AK >>  b. s/p CABG (L-LAD, S-Dx)   Chronic diastolic heart failure ([place]) 02/12/2017   Elevated LFTs 08/26/2015   Essential hypertension    Gallstone    HLD (hyperlipidemia)    Hyperlipidemia 06/07/2015   Ischemic cardiomyopathy    a. echo 6/16:  EF 35%, mild LVH, periapical HK, Gr 2 DD;  b.  Echo 7/16:  Mild LVH, mild focal basal septal hypertrophy, EF 60-65%, no RWMA, trivial AI, MAC, mild increased PASP, trivial effusion   Osteoporosis    Pneumonia    Psoriasis    UTI (lower urinary tract infection)    Past Surgical History:  Procedure Laterality Date   CARDIAC CATHETERIZATION N/A 05/22/2015   Procedure: Left Heart Cath and Coronary Angiography;  Surgeon: Belva Crome, MD;  Location: [facility];  Service: Cardiovascular;  Laterality: N/A;   CARPAL TUNNEL RELEASE     CORONARY ARTERY BYPASS GRAFT     CORONARY ARTERY BYPASS GRAFT N/A 05/22/2015   Procedure: CORONARY ARTERY BYPASS GRAFTING (CABG);  Surgeon: Melrose Nakayama, MD;  Location: [facility];  Service: Open Heart Surgery;  Laterality: N/A;   TEE WITHOUT CARDIOVERSION  05/22/2015    Procedure: TRANSESOPHAGEAL ECHOCARDIOGRAM (TEE);  Surgeon: Melrose Nakayama, MD;  Location: [facility];  Service: Open Heart Surgery;;   TONSILLECTOMY     TUBAL LIGATION  1971   WRIST FRACTURE SURGERY Bilateral 11/26/2008   Open reduction    Allergies  Allergies  Allergen Reactions   Avelox [Moxifloxacin] Shortness Of Breath and Other (See Comments)    Irregular heart rate and mucoid stools also   Humira [Adalimumab] Other (See Comments)    Stopped working/ possible cardiac reaction   Lipitor [Atorvastatin] Other (See Comments)    Turned urine brown - abnormal labs   Ultram [Tramadol Hcl] Nausea Only   Codeine Nausea And Vomiting and Rash   Methotrexate Derivatives Nausea And Vomiting   Sulfa Antibiotics Nausea Only and Rash   Statins Other (See Comments)    Urine turn very dark brown and her labs came back alarming (per the patient)   Monosodium Glutamate Other (See Comments)    headache   Sorbitol Diarrhea    History of Present Illness    Dana Palmer is a 80 y.o. female with a hx of hx of CAD s/p CABG with LIMA to LAD and SVG to diagonal in 2016, acute on chronic hearth failure with normalization of EF greater then 60% in 2016, hypertension and hyperlipidemia presents today for annual  follow-up last seen by Kathyrn Drown, NP on 05/07/2020.  04/2015 patient presented with chest pain and underwent LHC which showed ostial LAD thrombotic lesion obstructing the vessel to 95% and complex segmental 90% mid LAD disease. A branch of the large second diagonal is also severely disease but probably not graftable. Widely patent circumflex. Moderate right coronary disease. Acute systolic heart failure with anteroapical akinesis and suspected stunned anterior wall.>> CT surgery was consulted with plans for revascularization as above.   Echocardiogram at that time showed an LVEF of 35% with peri-apical severe hypokinesis.  On repeat echocardiogram 05/2015 EF had normalized to 60 to 65% with no  regional wall motion abnormalities and G1 DD.   She was seen by Dr. Tamala Julian 03/2019 and was doing well without anginal symptoms.  She continued to work in her garden without CV symptoms.  She did have difficulty getting Praluent however was back on therapy since 01/2019.   Ms. Garrette was last seen 05/07/2020 and at that time she reports she is doing well from a CV standpoint.  She had no anginal symptoms, shortness of breath, palpitations, LE edema, orthopnea, PND, dizziness or syncope.  She reported that she was in communication with pharmacy team regarding Praluent therapy (previously on with great results and no side effects) however due to change in insurance paperwork was to be resubmitted however she was never contacted after completion.  Discussed follow-up on this.  She would like to be placed back on Praluent.  She has no PCP therefore we discussed obtaining today.  She is asking about coming off of ASA as she feels she has a reaction with mucoid stools.  Reports previously discussed with Dr. Tamala Julian however ASA discontinuation was deferred given significant history of CAD.  Agreed.  She was encouraged to obtain a PCP for further work-up of the GI symptoms.    She was seen in the ED on 06/05/2020 for a fall. A CXR was obtained which showed rib fractures associated with a right pleural effusion, no pneumothorax. She was encouraged to take Ibuprofen or tylenol. There was nothing done for her trace pleural effusion but she was encouraged to use her incentive spirometer.   Today, we discussed that she was falling asleep at home a lot at home and was worried that it was related to her heart. 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  This has improved since the passing of her sister back in the summer. She primed her kitchen yesterday and plans to paint her kitchen today. She lives in a 4 bedroom house and has been able to  keep up with it other than the yard work recently. She does all the inside work. Her biggest issue today is stool incontinence. She has not had a chance to go see the GI doctor since her time was preoccupied with taking care of her sister. She was advised to do so. As of now she wears depends but she feels socially limited. She is also recommended to see a PCP and establish care. She did have a discuss with Dr. Tamala Julian about discontinuing her aspirin but at that time it was determined it was clinically necessary. She has had issues in the past with many medications.    Reports no shortness of breath nor dyspnea on exertion. Reports no chest pain, pressure, or tightness. No edema, orthopnea, PND. Reports no palpitations.  EKGs/Labs/Other Studies Reviewed:   The following studies were reviewed today:  EXAM: CT ANGIOGRAPHY CHEST, ABDOMEN AND PELVIS   TECHNIQUE: Multidetector CT imaging through the chest, abdomen and pelvis was performed using the standard protocol during bolus administration of intravenous contrast. Multiplanar reconstructed images and MIPs were obtained and reviewed to evaluate the vascular anatomy.   CONTRAST:  100 cc Isovue 370   COMPARISON:  07/23/2015 CT abdomen and pelvis. 07/03/2016 thoracic spine radiographs   FINDINGS: CTA CHEST FINDINGS   Cardiovascular: Preferential opacification of the thoracic aorta. No evidence of thoracic aortic aneurysm or dissection. Mild cardiomegaly. Status post CABG. No central or lobar pulmonary embolus. No pericardial effusion.   Mediastinum/Nodes: No enlarged mediastinal, hilar, or axillary lymph nodes. Thyroid gland, trachea, and esophagus demonstrate no significant findings.   Lungs/Pleura: Mild centrilobular emphysema of the lung apices. Multiple calcified granulomas of the lungs. Minor platelike atelectasis within the lung bases. No consolidation, pleural effusion, or pneumothorax.   Musculoskeletal: No chest wall  abnormality. No acute or significant osseous findings.   Review of the MIP images confirms the above findings.   CTA ABDOMEN AND PELVIS FINDINGS   VASCULAR   Aorta: Infrarenal abdominal aorta measures up to 2 cm. Moderate to severe calcific atherosclerosis of the abdominal aorta. No evidence for dissection.   Celiac: Patent without evidence of aneurysm, dissection, vasculitis or significant stenosis.   SMA: Patent without evidence of aneurysm, dissection, vasculitis or significant stenosis.   Renals: Both renal arteries are patent without evidence of aneurysm, dissection, vasculitis, fibromuscular dysplasia or significant stenosis.   IMA: Patent without evidence of aneurysm, dissection, vasculitis or significant stenosis.   Inflow: Patent without evidence of aneurysm, dissection, vasculitis or significant stenosis.   Veins: No obvious venous abnormality within the limitations of this arterial phase study.   Review of the MIP images confirms the above findings.   NON-VASCULAR   Hepatobiliary: No focal liver abnormality. Cholelithiasis. No intra or extrahepatic biliary ductal dilatation.   Pancreas: Unremarkable. No pancreatic ductal dilatation or surrounding inflammatory changes.   Spleen: Normal in size without focal abnormality.   Adrenals/Urinary Tract: Normal adrenal glands. Right kidney upper pole cyst measuring 4.2 cm. Right kidney interpolar nonobstructing caliceal stone measuring 4 mm. No hydronephrosis. Normal bladder.   Stomach/Bowel: Stomach is within normal limits. Appendix appears normal. No evidence of bowel wall thickening, distention, or inflammatory changes.   Lymphatic: No lymphadenopathy.   Reproductive: Uterine calcifications without appreciable mass, likely small underlying myomas. Normal adnexa.   Other: No abdominal wall hernia or abnormality. No abdominopelvic ascites.   Musculoskeletal: L1 vertebral body moderate anterior  compression deformity is new. Transitional S1 vertebral body. Stable grade 1 L4-5 anterolisthesis. Stable mild S-shaped curvature of the spine.   Review of the MIP images confirms the above findings.   IMPRESSION: 1. L1 vertebral body moderate anterior compression deformity with slight retropulsion of the superior endplate, new from 07/03/2016. 2. No evidence of aortic dissection. Moderate aortic calcific atherosclerosis. 3. Mild centrilobular emphysema of the lungs. 4. Mild cardiomegaly.  Status post CABG. 5. Cholelithiasis. 6. Right kidney nonobstructing stone.     Electronically Signed   By: Kristine Garbe M.D.   On: 03/27/2017 23:13  Study Conclusions   - Left ventricle: The cavity size was normal. Wall thickness was    increased in a pattern of mild LVH. There was mild focal basal    hypertrophy of the septum. Systolic function was normal. The    estimated ejection fraction was in  the range of 60% to 65%. Wall    motion was normal; there were no regional wall motion    abnormalities. Doppler parameters are consistent with abnormal    left ventricular relaxation (grade 1 diastolic dysfunction).  - Aortic valve: There was trivial regurgitation.  - Mitral valve: Calcified annulus.  - Pulmonary arteries: Systolic pressure was mildly increased.  - Pericardium, extracardiac: A trivial pericardial effusion was    identified.   Impressions:   - Normal LV function; grade 1 diastolic dysfunction; trace AI; mild    TR; midly elevated pulmonary pressure.   Transthoracic echocardiography.  M-mode, complete 2D, spectral  Doppler, and color Doppler.  Birthdate:  Patient birthdate:  07-Oct-1941.  Age:  Patient is 80 yr old.  Sex:  Gender: female.  BMI: 24.5 kg/m^2.  Blood pressure:     130/72  Patient status:  Outpatient.  Study date:  Study date: 06/08/2015. Study time: 10:28  AM.  Location:  [facility]    -------------------------------------------------------------------   -------------------------------------------------------------------  Left ventricle:  The cavity size was normal. Wall thickness was  increased in a pattern of mild LVH. There was mild focal basal  hypertrophy of the septum. Systolic function was normal. The  estimated ejection fraction was in the range of 60% to 65%. Wall  motion was normal; there were no regional wall motion  abnormalities. Doppler parameters are consistent with abnormal left  ventricular relaxation (grade 1 diastolic dysfunction).   -------------------------------------------------------------------  Aortic valve:   Trileaflet; mildly calcified leaflets. Mobility was  not restricted.  Doppler:  Transvalvular velocity was within the  normal range. There was no stenosis. There was trivial  regurgitation.   -------------------------------------------------------------------  Aorta:  Aortic root: The aortic root was normal in size.   -------------------------------------------------------------------  Mitral valve:   Calcified annulus. Mobility was not restricted.  Doppler:  Transvalvular velocity was within the normal range. There  was no evidence for stenosis. There was trivial regurgitation.  Peak gradient (D): 3 mm Hg.   -------------------------------------------------------------------  Left atrium:  The atrium was normal in size.   -------------------------------------------------------------------  Right ventricle:  The cavity size was normal. Systolic function was  normal.   -------------------------------------------------------------------  Pulmonic valve:    Doppler:  Transvalvular velocity was within the  normal range. There was no evidence for stenosis. There was trivial  regurgitation.   -------------------------------------------------------------------  Tricuspid valve:   Structurally normal valve.    Doppler:   Transvalvular velocity was within the normal range. There was mild  regurgitation.   -------------------------------------------------------------------  Pulmonary artery:   Systolic pressure was mildly increased.   -------------------------------------------------------------------  Right atrium:  The atrium was normal in size.   -------------------------------------------------------------------  Pericardium:  A trivial pericardial effusion was identified.   -------------------------------------------------------------------  Systemic veins:  Inferior vena cava: The vessel was normal in size.   -------------------------------------------------------------------  Measurements    Left ventricle                         Value        Reference   LV ID, ED, PLAX chordal        (L)     30    mm     43 - 52   LV ID, ES, PLAX chordal        (L)     20    mm     23 - 38   LV fx shortening, PLAX chordal  33    %      >=29   LV PW thickness, ED                    13    mm     ---------   IVS/LV PW ratio, ED                    1.15         <=1.3   Stroke volume, 2D                      50    ml     ---------   Stroke volume/bsa, 2D                  30    ml/m^2 ---------   LV e&', lateral                         10.1  cm/s   ---------   LV E/e&', lateral                       8.12         ---------   LV e&', medial                          6.85  cm/s   ---------   LV E/e&', medial                        11.97        ---------   LV e&', average                         8.48  cm/s   ---------   LV E/e&', average                       9.68         ---------     Ventricular septum                     Value        Reference   IVS thickness, ED                      15    mm     ---------     LVOT                                   Value        Reference   LVOT ID, S                             19    mm     ---------   LVOT area                              2.84  cm^2   ---------   LVOT  mean velocity, S                  81.9  cm/s   ---------   LVOT VTI, S  17.6  cm     ---------     Aorta                                  Value        Reference   Aortic root ID, ED                     29    mm     ---------     Left atrium                            Value        Reference   LA ID, A-P, ES                         30    mm     ---------   LA ID/bsa, A-P                         1.83  cm/m^2 <=2.2   LA volume, S                           25.4  ml     ---------   LA volume/bsa, S                       15.5  ml/m^2 ---------   LA volume, ES, 1-p A4C                 17.2  ml     ---------   LA volume/bsa, ES, 1-p A4C             10.5  ml/m^2 ---------   LA volume, ES, 1-p A2C                 34.8  ml     ---------   LA volume/bsa, ES, 1-p A2C             21.2  ml/m^2 ---------     Mitral valve                           Value        Reference   Mitral E-wave peak velocity            82    cm/s   ---------   Mitral A-wave peak velocity            123   cm/s   ---------   Mitral deceleration time               201   ms     150 - 230   Mitral peak gradient, D                3     mm Hg  ---------   Mitral E/A ratio, peak                 0.67         ---------     Tricuspid valve                        Value  Reference   Tricuspid regurg peak velocity         257   cm/s   ---------   Tricuspid peak RV-RA gradient          26    mm Hg  ---------     Right ventricle                        Value        Reference   RV s&', lateral, S                      15.1  cm/s   ---------   Legend:  (L)  and  (H)  mark values outside specified reference range.   -------------------------------------------------------------------  Prepared and Electronically Authenticated by   Kirk Ruths  2016-07-12T12:29:54   Cardiac Catheterization 05/22/2015  Ostial LAD thrombotic lesion obstructing the vessel to 95% and complex segmental 90% mid LAD disease. A  branch of the large second diagonal is also severely disease but probably not graftable. Widely patent circumflex. Moderate right coronary disease. Acute systolic heart failure with anteroapical akinesis and suspected stunned anterior wall.     RECOMMENDATIONS:   After consulting with Dr. Roxan Hockey, reviewing images, considering the clinical presentation, we agreed that bypass grafting with LIMA to LAD and vein graft to diagonal is the best approach. PCI on the LAD could be complicated by embolization.    EKG:  EKG is  ordered today.  The ekg ordered today demonstrates NSR with prolonged Qtc 485  Recent Labs: No results found for requested labs within last 8760 hours.  Recent Lipid Panel    Component Value Date/Time   CHOL 153 06/28/2020 1052   TRIG 64 06/28/2020 1052   HDL 75 06/28/2020 1052   CHOLHDL 2.0 06/28/2020 1052   CHOLHDL 2.8 03/27/2017 2012   VLDL 12 03/27/2017 2012   LDLCALC 65 06/28/2020 1052   LDLDIRECT 124 (H) 03/19/2017 0000    Home Medications   Current Meds  Medication Sig   Alirocumab (PRALUENT) 75 MG/ML SOAJ Inject 1 pen into the skin every 14 (fourteen) days.   amLODipine (NORVASC) 5 MG tablet Take 1 tablet (5 mg total) by mouth daily. Please make overdue appt with Dr. Tamala Julian before anymore refills. Thank you 2nd attempt   aspirin EC 81 MG tablet Take 81 mg by mouth at bedtime.     Review of Systems     All other systems reviewed and are otherwise negative except as noted above.  Physical Exam    VS:  BP 122/72 (BP Location: Left Arm, Patient Position: Sitting, Cuff Size: Normal)   Pulse 81   Ht _0  (1.575 m)   Wt 127 lb 9.6 oz (57.9 kg)   BMI 23.34 kg/m  , BMI Body mass index is 23.34 kg/m.  Wt Readings from Last 3 Encounters:  10/12/21 127 lb 9.6 oz (57.9 kg)  05/07/20 127 lb 6.4 oz (57.8 kg)  03/31/19 130 lb 3.2 oz (59.1 kg)     GEN: Well nourished, well developed, in no acute distress. HEENT: normal. Cardiac: RRR, no murmurs, rubs,  or gallops. No clubbing, cyanosis, edema.   Respiratory: Respirations regular and unlabored, clear to auscultation bilaterally. GI: Soft, nontender, nondistended. MS: No deformity or atrophy. Skin: Warm and dry, no rash. Neuro:  Strength and sensation are intact. Psych: Normal affect.  Assessment & Plan    Chronic systolic and diastolic CHF-LVEF 55%, appears  euvolemic today, continue current medication regimen. She does notice swelling in her legs after exercise. We discussed TED hose or compression socks for her to wear on her more active days. Usually the edema resolves after putting her feet up at the end of the day. No indication for loop diuretic at this time.   Hyperlipidemia- Last LDL 65 drawn on 05/20/2021. Re-draw lipid panel today. Continue Praluent. She is tolerated well without any side effects.   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 Not on QT prolonging agents. BMET and mag ordered today to r/o electrolytes abnormality. F/u with periodic EKG.   CAD s/p CABG 2016-no recent chest pain, GDMT: ASA and statin. She has had issues with medications at the time of her heart attack.   Fecal incontinence-Continues to be an issue and she wears depends. She plans to follow-up with GI to r/o other cause. She was interested in a trial of stopping the ASA 81 however, I asked her to address this with Dr. Tamala Julian during her next follow-up and if the GI workup was otherwise negative.   Hypertension-stable today. She is a retired [employer] scientist and she has not been taking her BP at home. She has not had any dizziness at home or any other reasons to believe her BP was getting too high or low.   Disposition: Follow up in 1 year with Sinclair Grooms, MD or APP.  Signed, Elgie Collard, PA-C 10/12/2021, 10:05 AM [place]

## 2021-10-12 ENCOUNTER — Ambulatory Visit (INDEPENDENT_AMBULATORY_CARE_PROVIDER_SITE_OTHER): Payer: Medicare Other | Admitting: Physician Assistant

## 2021-10-12 ENCOUNTER — Encounter (HOSPITAL_BASED_OUTPATIENT_CLINIC_OR_DEPARTMENT_OTHER): Payer: Self-pay | Admitting: Physician Assistant

## 2021-10-12 ENCOUNTER — Other Ambulatory Visit: Payer: Self-pay

## 2021-10-12 VITALS — BP 122/72 | HR 81 | Ht 62.0 in | Wt 127.6 lb

## 2021-10-12 DIAGNOSIS — I2510 Atherosclerotic heart disease of native coronary artery without angina pectoris: Secondary | ICD-10-CM

## 2021-10-12 DIAGNOSIS — I10 Essential (primary) hypertension: Secondary | ICD-10-CM | POA: Diagnosis not present

## 2021-10-12 DIAGNOSIS — E782 Mixed hyperlipidemia: Secondary | ICD-10-CM

## 2021-10-12 DIAGNOSIS — I5040 Unspecified combined systolic (congestive) and diastolic (congestive) heart failure: Secondary | ICD-10-CM

## 2021-10-12 DIAGNOSIS — R9431 Abnormal electrocardiogram [ECG] [EKG]: Secondary | ICD-10-CM

## 2021-10-12 NOTE — Patient Instructions (Addendum)
Medication Instructions:   Your physician recommends that you continue on your current medications as directed. Please refer to the Current Medication list given to you today.  *If you need a refill on your cardiac medications before your next appointment, please call your pharmacy*   Lab Work: TODAY!!!! LIPID/BMET/MAG in the 3rd floor [address]  If you have labs (blood work) drawn today and your tests are completely normal, you will receive your results only by: MyChart Message (if you have MyChart) OR A paper copy in the mail If you have any lab test that is abnormal or we need to change your treatment, we will call you to review the results.   Testing/Procedures:  -NONE   Follow-Up: At [facility], you and your health needs are our priority.  As part of our continuing mission to provide you with exceptional heart care, we have created designated Provider Care Teams.  These Care Teams include your primary Cardiologist (physician) and Advanced Practice Providers (APPs -  Physician Assistants and Nurse Practitioners) who all work together to provide you with the care you need, when you need it.  We recommend signing up for the patient portal called "MyChart".  Sign up information is provided on this After Visit Summary.  MyChart is used to connect with patients for Virtual Visits (Telemedicine).  Patients are able to view lab/test results, encounter notes, upcoming appointments, etc.  Non-urgent messages can be sent to your provider as well.   To learn more about what you can do with MyChart, go to [email or web address].    Your next appointment:   1 year(s)  The format for your next appointment:   In Person  Provider:   Lesleigh Noe, MD     Other Instructions  Please follow up with your GI doctor for diaharrea.  Please refer to paperwork today for a Primary Care Doctor.

## 2021-10-13 ENCOUNTER — Telehealth: Payer: Self-pay | Admitting: *Deleted

## 2021-10-13 DIAGNOSIS — E782 Mixed hyperlipidemia: Secondary | ICD-10-CM

## 2021-10-13 LAB — BASIC METABOLIC PANEL
BUN/Creatinine Ratio: 18 (ref 12–28)
BUN: 15 mg/dL (ref 8–27)
CO2: 22 mmol/L (ref 20–29)
Calcium: 9.4 mg/dL (ref 8.7–10.3)
Chloride: 104 mmol/L (ref 96–106)
Creatinine, Ser: 0.82 mg/dL (ref 0.57–1.00)
Glucose: 91 mg/dL (ref 70–99)
Potassium: 4.4 mmol/L (ref 3.5–5.2)
Sodium: 143 mmol/L (ref 134–144)
eGFR: 73 mL/min/{1.73_m2} (ref >59)

## 2021-10-13 LAB — MAGNESIUM: Magnesium: 2 mg/dL (ref 1.6–2.3)

## 2021-10-13 LAB — LIPID PANEL
Chol/HDL Ratio: 2.2 ratio (ref 0.0–4.4)
Cholesterol, Total: 197 mg/dL (ref 100–199)
HDL: 89 mg/dL (ref >39)
LDL Chol Calc (NIH): 96 mg/dL (ref 0–99)
Triglycerides: 66 mg/dL (ref 0–149)
VLDL Cholesterol Cal: 12 mg/dL (ref 5–40)

## 2021-10-13 NOTE — Telephone Encounter (Signed)
-----   Message from Sharlene Dory, [place] sent at 10/13/2021  7:45 AM EST ----- Normal kidneys and electrolytes, LDL not at goal. Please ensure she is taking her Pralulent without missed doses. If missed doses, repeat labs in 6 weeks. If no missed doses increase to 150mg  every 2 week with repeat FLP in 6 weeks.

## 2021-10-30 ENCOUNTER — Other Ambulatory Visit: Payer: Self-pay | Admitting: Interventional Cardiology

## 2021-11-25 LAB — LIPID PANEL
Chol/HDL Ratio: 2 ratio (ref 0.0–4.4)
Cholesterol, Total: 163 mg/dL (ref 100–199)
HDL: 81 mg/dL (ref >39)
LDL Chol Calc (NIH): 68 mg/dL (ref 0–99)
Triglycerides: 71 mg/dL (ref 0–149)
VLDL Cholesterol Cal: 14 mg/dL (ref 5–40)

## 2021-11-29 NOTE — Progress Notes (Signed)
Seen by patient [name] on 11/25/2021  7:05 PM

## 2022-03-06 ENCOUNTER — Other Ambulatory Visit: Payer: Self-pay | Admitting: Interventional Cardiology

## 2022-10-27 ENCOUNTER — Other Ambulatory Visit: Payer: Self-pay | Admitting: Interventional Cardiology

## 2022-11-09 ENCOUNTER — Other Ambulatory Visit: Payer: Self-pay | Admitting: Interventional Cardiology

## 2022-11-12 NOTE — Progress Notes (Signed)
Office Visit    Patient Name: Dana Palmer Date of Encounter: 11/12/2022  Primary Care Provider:  Patient, No Pcp Per Primary Cardiologist:  Sinclair Grooms, MD Primary Electrophysiologist: None  Chief Complaint    Dana Palmer is a 81 y.o. female with PMH of CAD s/p anterior STEMI with CABG 1610, chronic diastolic CHF, ICM, HLD, HTN who presents today for 1 year follow-up of CAD.  Past Medical History    Past Medical History:  Diagnosis Date    Remote Anterior Stemi 05/22/2015   CAD (coronary artery disease)    a. ant-lat STEMI 6/16 >> LHC:  ostial to mid LAD 99% with complex thrombus, mid to dist LAD 99%, D2 80%, prox RCA 50%, mid RCA 50%, EF 35% with ant-apical AK >>  b. s/p CABG (L-LAD, S-Dx)   Chronic diastolic heart failure ([place]) 02/12/2017   Elevated LFTs 08/26/2015   Essential hypertension    Gallstone    HLD (hyperlipidemia)    Hyperlipidemia 06/07/2015   Ischemic cardiomyopathy    a. echo 6/16:  EF 35%, mild LVH, periapical HK, Gr 2 DD;  b.  Echo 7/16:  Mild LVH, mild focal basal septal hypertrophy, EF 60-65%, no RWMA, trivial AI, MAC, mild increased PASP, trivial effusion   Osteoporosis    Pneumonia    Psoriasis    UTI (lower urinary tract infection)    Past Surgical History:  Procedure Laterality Date   CARDIAC CATHETERIZATION N/A 05/22/2015   Procedure: Left Heart Cath and Coronary Angiography;  Surgeon: Belva Crome, MD;  Location: [facility];  Service: Cardiovascular;  Laterality: N/A;   CARPAL TUNNEL RELEASE     CORONARY ARTERY BYPASS GRAFT     CORONARY ARTERY BYPASS GRAFT N/A 05/22/2015   Procedure: CORONARY ARTERY BYPASS GRAFTING (CABG);  Surgeon: Melrose Nakayama, MD;  Location: [facility];  Service: Open Heart Surgery;  Laterality: N/A;   TEE WITHOUT CARDIOVERSION  05/22/2015   Procedure: TRANSESOPHAGEAL ECHOCARDIOGRAM (TEE);  Surgeon: Melrose Nakayama, MD;  Location: [facility];  Service: Open Heart Surgery;;   TONSILLECTOMY     TUBAL LIGATION   1971   WRIST FRACTURE SURGERY Bilateral 11/26/2008   Open reduction    Allergies  Allergies  Allergen Reactions   Avelox [Moxifloxacin] Shortness Of Breath and Other (See Comments)    Irregular heart rate and mucoid stools also   Humira [Adalimumab] Other (See Comments)    Stopped working/ possible cardiac reaction   Lipitor [Atorvastatin] Other (See Comments)    Turned urine brown - abnormal labs   Ultram [Tramadol Hcl] Nausea Only   Codeine Nausea And Vomiting and Rash   Methotrexate Derivatives Nausea And Vomiting   Sulfa Antibiotics Nausea Only and Rash   Statins Other (See Comments)    Urine turn very dark brown and her labs came back alarming (per the patient)   Monosodium Glutamate Other (See Comments)    headache   Sorbitol Diarrhea    History of Present Illness    Dana Palmer  is a 81 year old female with the above mention past medical history who presents today for 1 year follow-up of coronary artery disease.  Ms. Barley was initially seen in 2016 when she presented to the ED with anterior STEMI and underwent LHC that showed severe ostial and mid LAD disease with indication for CABG.  TTE was also performed showing severe hypokinesis and EF of 35% with grade 2 DD.  She underwent bypass on 05/23/2015  with postoperative tachycardia that required up titration of beta-blocker but remained sinus rhythm.  She also developed small pericardial effusion that did not require pericardiocentesis.  She was discharged in stable condition on 05/27/2015.    She was seen in follow-up on 05/2015 and was doing well with some chest soreness that was improving.  She reported cough with ACE inhibitor and was switched to ARB.  2D echo was also repeated that showed improved EF of 60-65% with mild LVH and mild focal hypertrophy of septum with no RWMA and grade 1 DD.  She developed diarrhea with losartan that was subsequently stopped and atorvastatin was also discontinued due to discolored urine and  elevated LFTs..  She noted elevated blood pressures in the 160s over 100s and was started on amlodipine 5 mg.  She was referred to the lipid clinic and was placed on PCSK9 inhibitor.  She was last seen in follow-up on 10/12/2021 by Nicholes Rough, PA.  During her visit she noticed some swelling in her lower extremities and TED hose were recommended but no indication for diuretics at that time.  She was doing well on Praluent with no complaints of chest pain.  She also plan to follow-up with GI regarding fecal incontinence.   Since last being seen in the office patient reports***.  Patient denies chest pain, palpitations, dyspnea, PND, orthopnea, nausea, vomiting, dizziness, syncope, edema, weight gain, or early satiety.   ***Notes: Prolonged QTc noted, fecal incontinence, lower extremity edema during last visit Home Medications    Current Outpatient Medications  Medication Sig Dispense Refill   Alirocumab (PRALUENT) 75 MG/ML SOAJ INJECT 1 PEN INTO THE SKIN EVERY 14 (FOURTEEN) DAYS. 6 mL 3   amLODipine (NORVASC) 5 MG tablet TAKE 1 TABLET BY MOUTH EVERY DAY 30 tablet 0   aspirin EC 81 MG tablet Take 81 mg by mouth at bedtime.     No current facility-administered medications for this visit.     Review of Systems  Please see the history of present illness.    (+)*** (+)***  All other systems reviewed and are otherwise negative except as noted above.  Physical Exam    Wt Readings from Last 3 Encounters:  10/12/21 127 lb 9.6 oz (57.9 kg)  05/07/20 127 lb 6.4 oz (57.8 kg)  03/31/19 130 lb 3.2 oz (59.1 kg)   YE:BXIDH were no vitals filed for this visit.,There is no height or weight on file to calculate BMI.  Constitutional:      Appearance: Healthy appearance. Not in distress.  Neck:     Vascular: JVD normal.  Pulmonary:     Effort: Pulmonary effort is normal.     Breath sounds: No wheezing. No rales. Diminished in the bases Cardiovascular:     Normal rate. Regular rhythm. Normal S1.  Normal S2.      Murmurs: There is no murmur.  Edema:    Peripheral edema absent.  Abdominal:     Palpations: Abdomen is soft non tender. There is no hepatomegaly.  Skin:    General: Skin is warm and dry.  Neurological:     General: No focal deficit present.     Mental Status: Alert and oriented to person, place and time.     Cranial Nerves: Cranial nerves are intact.  EKG/LABS/Other Studies Reviewed    ECG personally reviewed by me today - ***  Risk Assessment/Calculations:   {Does this patient have ATRIAL FIBRILLATION?:[phone]}        Lab Results  Component Value Date  WBC 5.8 05/10/2020   HGB 12.3 05/10/2020   HCT 38.7 05/10/2020   MCV 83 05/10/2020   PLT 285 05/10/2020   Lab Results  Component Value Date   CREATININE 0.82 10/12/2021   BUN 15 10/12/2021   NA 143 10/12/2021   K 4.4 10/12/2021   CL 104 10/12/2021   CO2 22 10/12/2021   Lab Results  Component Value Date   ALT 8 06/28/2020   AST 14 06/28/2020   ALKPHOS 80 06/28/2020   BILITOT 0.4 06/28/2020   Lab Results  Component Value Date   CHOL 163 11/24/2021   HDL 81 11/24/2021   LDLCALC 68 11/24/2021   LDLDIRECT 124 (H) 03/19/2017   TRIG 71 11/24/2021   CHOLHDL 2.0 11/24/2021    Lab Results  Component Value Date   HGBA1C 5.7 (H) 05/22/2015    Assessment & Plan    1.  History of coronary artery disease: -s/p CABG in 2016 with no recurrence of angina since procedure. -Today patient reports*** -Continue GDMT with ASA 81 mg and Praluent 75 mg q 14 days  2.  Chronic diastolic CHF: -Patient's last 2D echo was 05/2015 showing normalized EF of 60-65% -Today patient is***  3.  Essential hypertension: -Patient's blood pressure today was*** -Continue Norvasc 5 mg daily  4.  Hyperlipidemia: -Patient's last LDL cholesterol was*** -Continue Praluent as noted above  5.  Prolonged QTc: -EKG obtained today noting***      Disposition: Follow-up with Belva Crome III, MD or APP in ***  months {Are you ordering a CV Procedure (e.g. stress test, cath, DCCV, TEE, etc)?   Press F2        :[id number]}   Medication Adjustments/Labs and Tests Ordered: Current medicines are reviewed at length with the patient today.  Concerns regarding medicines are outlined above.   Signed, Mable Fill, Marissa Nestle, NP 11/12/2022, 1:35 PM [facility]  Note:  This document was prepared using Dragon voice recognition software and may include unintentional dictation errors.

## 2022-11-13 ENCOUNTER — Encounter: Payer: Self-pay | Admitting: Nurse Practitioner

## 2022-11-13 ENCOUNTER — Ambulatory Visit: Payer: Medicare Other | Attending: Nurse Practitioner | Admitting: Nurse Practitioner

## 2022-11-13 VITALS — BP 130/70 | HR 75 | Ht 61.0 in | Wt 128.0 lb

## 2022-11-13 DIAGNOSIS — E782 Mixed hyperlipidemia: Secondary | ICD-10-CM

## 2022-11-13 DIAGNOSIS — I25810 Atherosclerosis of coronary artery bypass graft(s) without angina pectoris: Secondary | ICD-10-CM

## 2022-11-13 DIAGNOSIS — I5032 Chronic diastolic (congestive) heart failure: Secondary | ICD-10-CM | POA: Diagnosis not present

## 2022-11-13 DIAGNOSIS — I10 Essential (primary) hypertension: Secondary | ICD-10-CM

## 2022-11-13 DIAGNOSIS — R9431 Abnormal electrocardiogram [ECG] [EKG]: Secondary | ICD-10-CM

## 2022-11-13 LAB — HEPATIC FUNCTION PANEL
ALT: 9 IU/L (ref 0–32)
AST: 15 IU/L (ref 0–40)
Albumin: 4.1 g/dL (ref 3.8–4.8)
Alkaline Phosphatase: 71 IU/L (ref 44–121)
Bilirubin Total: 0.2 mg/dL (ref 0.0–1.2)
Bilirubin, Direct: <0.1 mg/dL (ref 0.00–0.40)
Total Protein: 6.9 g/dL (ref 6.0–8.5)

## 2022-11-13 LAB — LIPID PANEL
Chol/HDL Ratio: 2.1 ratio (ref 0.0–4.4)
Cholesterol, Total: 174 mg/dL (ref 100–199)
HDL: 84 mg/dL (ref >39)
LDL Chol Calc (NIH): 79 mg/dL (ref 0–99)
Triglycerides: 56 mg/dL (ref 0–149)
VLDL Cholesterol Cal: 11 mg/dL (ref 5–40)

## 2022-11-13 NOTE — Patient Instructions (Addendum)
Medication Instructions:  Your physician recommends that you continue on your current medications as directed. Please refer to the Current Medication list given to you today. *If you need a refill on your cardiac medications before your next appointment, please call your pharmacy*   Lab Work: TODAY-LIPID & LFT If you have labs (blood work) drawn today and your tests are completely normal, you will receive your results only by: MyChart Message (if you have MyChart) OR A paper copy in the mail If you have any lab test that is abnormal or we need to change your treatment, we will call you to review the results.   Testing/Procedures: NONE ORDERED   Follow-Up: At [facility], you and your health needs are our priority.  As part of our continuing mission to provide you with exceptional heart care, we have created designated Provider Care Teams.  These Care Teams include your primary Cardiologist (physician) and Advanced Practice Providers (APPs -  Physician Assistants and Nurse Practitioners) who all work together to provide you with the care you need, when you need it.  We recommend signing up for the patient portal called "MyChart".  Sign up information is provided on this After Visit Summary.  MyChart is used to connect with patients for Virtual Visits (Telemedicine).  Patients are able to view lab/test results, encounter notes, upcoming appointments, etc.  Non-urgent messages can be sent to your provider as well.   To learn more about what you can do with MyChart, go to [email or web address].    Your next appointment:   12 month(s)  The format for your next appointment:   In Person  Provider:   TBD   Other Instructions You have been referred to PRIMARY CARE PROVIDER-DR Ted Mcalpine [phone]  [facility]  Important Information About Sugar

## 2022-12-08 ENCOUNTER — Other Ambulatory Visit (HOSPITAL_COMMUNITY): Payer: Self-pay

## 2022-12-21 ENCOUNTER — Other Ambulatory Visit: Payer: Self-pay | Admitting: Interventional Cardiology

## 2023-02-01 ENCOUNTER — Other Ambulatory Visit: Payer: Self-pay

## 2023-02-01 ENCOUNTER — Emergency Department (HOSPITAL_COMMUNITY)
Admission: EM | Admit: 2023-02-01 | Discharge: 2023-02-01 | Disposition: A | Discharge status: Home / Self Care | Payer: Medicare Other | Attending: Emergency Medicine | Admitting: Emergency Medicine

## 2023-02-01 ENCOUNTER — Emergency Department (HOSPITAL_COMMUNITY): Payer: Medicare Other

## 2023-02-01 DIAGNOSIS — Z1152 Encounter for screening for COVID-19: Secondary | ICD-10-CM | POA: Insufficient documentation

## 2023-02-01 DIAGNOSIS — I10 Essential (primary) hypertension: Secondary | ICD-10-CM | POA: Diagnosis not present

## 2023-02-01 DIAGNOSIS — Z7982 Long term (current) use of aspirin: Secondary | ICD-10-CM | POA: Diagnosis not present

## 2023-02-01 DIAGNOSIS — R197 Diarrhea, unspecified: Secondary | ICD-10-CM | POA: Diagnosis not present

## 2023-02-01 DIAGNOSIS — I2510 Atherosclerotic heart disease of native coronary artery without angina pectoris: Secondary | ICD-10-CM | POA: Diagnosis not present

## 2023-02-01 DIAGNOSIS — Z79899 Other long term (current) drug therapy: Secondary | ICD-10-CM | POA: Diagnosis not present

## 2023-02-01 DIAGNOSIS — J4521 Mild intermittent asthma with (acute) exacerbation: Secondary | ICD-10-CM

## 2023-02-01 DIAGNOSIS — R0602 Shortness of breath: Secondary | ICD-10-CM | POA: Diagnosis present

## 2023-02-01 LAB — CBC WITH DIFFERENTIAL/PLATELET
Abs Immature Granulocytes: 0.01 10*3/uL (ref 0.00–0.07)
Basophils Absolute: 0 10*3/uL (ref 0.0–0.1)
Basophils Relative: 0 %
Eosinophils Absolute: 0.1 10*3/uL (ref 0.0–0.5)
Eosinophils Relative: 2 %
HCT: 38.6 % (ref 36.0–46.0)
Hemoglobin: 12.5 g/dL (ref 12.0–15.0)
Immature Granulocytes: 0 %
Lymphocytes Relative: 25 %
Lymphs Abs: 2 10*3/uL (ref 0.7–4.0)
MCH: 27.7 pg (ref 26.0–34.0)
MCHC: 32.4 g/dL (ref 30.0–36.0)
MCV: 85.6 fL (ref 80.0–100.0)
Monocytes Absolute: 0.6 10*3/uL (ref 0.1–1.0)
Monocytes Relative: 8 %
Neutro Abs: 5 10*3/uL (ref 1.7–7.7)
Neutrophils Relative %: 65 %
Platelets: 273 10*3/uL (ref 150–400)
RBC: 4.51 MIL/uL (ref 3.87–5.11)
RDW: 12.5 % (ref 11.5–15.5)
WBC: 7.7 10*3/uL (ref 4.0–10.5)
nRBC: 0 % (ref 0.0–0.2)

## 2023-02-01 LAB — BASIC METABOLIC PANEL
Anion gap: 9 (ref 5–15)
BUN: 17 mg/dL (ref 8–23)
CO2: 26 mmol/L (ref 22–32)
Calcium: 9 mg/dL (ref 8.9–10.3)
Chloride: 104 mmol/L (ref 98–111)
Creatinine, Ser: 0.8 mg/dL (ref 0.44–1.00)
GFR, Estimated: >60 mL/min (ref >60)
Glucose, Bld: 88 mg/dL (ref 70–99)
Potassium: 3.5 mmol/L (ref 3.5–5.1)
Sodium: 139 mmol/L (ref 135–145)

## 2023-02-01 LAB — TROPONIN I (HIGH SENSITIVITY)
Troponin I (High Sensitivity): 5 ng/L (ref <18)
Troponin I (High Sensitivity): 6 ng/L (ref <18)

## 2023-02-01 LAB — RESP PANEL BY RT-PCR (RSV, FLU A&B, COVID)  RVPGX2
Influenza A by PCR: NEGATIVE
Influenza B by PCR: NEGATIVE
Resp Syncytial Virus by PCR: NEGATIVE
SARS Coronavirus 2 by RT PCR: NEGATIVE

## 2023-02-01 LAB — BRAIN NATRIURETIC PEPTIDE: B Natriuretic Peptide: 24.4 pg/mL (ref 0.0–100.0)

## 2023-02-01 MED ORDER — AEROCHAMBER PLUS FLO-VU LARGE MISC
1.0000 | Freq: Once | Status: AC
  Administered 2023-02-01: 1

## 2023-02-01 MED ORDER — IPRATROPIUM-ALBUTEROL 0.5-2.5 (3) MG/3ML IN SOLN
3.0000 mL | Freq: Once | RESPIRATORY_TRACT | Status: AC
  Administered 2023-02-01: 3 mL via RESPIRATORY_TRACT
  Filled 2023-02-01: qty 3

## 2023-02-01 MED ORDER — ALBUTEROL SULFATE HFA 108 (90 BASE) MCG/ACT IN AERS
1.0000 | INHALATION_SPRAY | Freq: Once | RESPIRATORY_TRACT | Status: AC
  Administered 2023-02-01: 2 via RESPIRATORY_TRACT
  Filled 2023-02-01: qty 6.7

## 2023-02-01 MED ORDER — AEROCHAMBER PLUS FLO-VU LARGE MISC
Status: AC
  Filled 2023-02-01: qty 1

## 2023-02-01 MED ORDER — NITROGLYCERIN 0.4 MG SL SUBL
0.4000 mg | SUBLINGUAL_TABLET | SUBLINGUAL | Status: DC | PRN
  Administered 2023-02-01: 0.4 mg via SUBLINGUAL
  Filled 2023-02-01: qty 1

## 2023-02-01 NOTE — ED Triage Notes (Signed)
Pt arrived from home. Pt states she has large BM around 430pm, sudden onset chest pressure and unable to get full breath. Pt a/ox4.

## 2023-02-01 NOTE — ED Provider Notes (Signed)
[facility] Provider Note   CSN: [id number] Arrival date & time: 02/01/23  1858     History  Chief Complaint  Patient presents with   Shortness of Breath    Dana Palmer is a 82 y.o. female.  Pt is a 82 yo female with pmhx significant for CAD, HTN, HLD, osteoporosis, and ischemic cardiomyopathy.  Pt was outside in her garden putting out pine needles and weeding.  She had a sudden onset of sob and diarrhea.  She had diarrhea twice yesterday, but did not feel sob.  The diarrhea has resolved, but she continues to have sob.  Pt said she feels like she can't take a deep breath.  She denies any pain.  She did note the pine needles had been sitting under a tarp for several months.       Home Medications Prior to Admission medications   Medication Sig Start Date End Date Taking? Authorizing Provider  Alirocumab (PRALUENT) 75 MG/ML SOAJ INJECT 1 PEN INTO THE SKIN EVERY 14 (FOURTEEN) DAYS. 03/06/22   Belva Crome, MD  amLODipine (NORVASC) 5 MG tablet TAKE 1 TABLET BY MOUTH EVERY DAY 12/21/22   Sueanne Margarita, MD  aspirin EC 81 MG tablet Take 81 mg by mouth every other day. Swallow whole.    [provider]      Allergies    Avelox [moxifloxacin], Humira [adalimumab], Lipitor [atorvastatin], Ultram [tramadol hcl], Codeine, Methotrexate derivatives, Sulfa antibiotics, Statins, Monosodium glutamate, and Sorbitol    Review of Systems   Review of Systems  Respiratory:  Positive for shortness of breath.   Gastrointestinal:  Positive for diarrhea.  All other systems reviewed and are negative.   Physical Exam Updated Vital Signs BP (!) 144/92 (BP Location: Left Arm)   Pulse 90   Temp 98.2 F (36.8 C) (Oral)   Resp 19   SpO2 97%  Physical Exam Vitals and nursing note reviewed.  Constitutional:      Appearance: She is well-developed.  HENT:     Head: Normocephalic and atraumatic.     Mouth/Throat:     Mouth: Mucous membranes  are moist.     Pharynx: Oropharynx is clear.  Eyes:     Extraocular Movements: Extraocular movements intact.     Pupils: Pupils are equal, round, and reactive to light.  Cardiovascular:     Rate and Rhythm: Normal rate and regular rhythm.  Pulmonary:     Effort: Pulmonary effort is normal.     Breath sounds: Normal breath sounds.  Abdominal:     General: Bowel sounds are normal.     Palpations: Abdomen is soft.  Musculoskeletal:        General: Normal range of motion.     Cervical back: Normal range of motion and neck supple.  Skin:    General: Skin is warm.     Capillary Refill: Capillary refill takes less than 2 seconds.  Neurological:     General: No focal deficit present.     Mental Status: She is alert and oriented to person, place, and time.  Psychiatric:        Mood and Affect: Mood normal.        Behavior: Behavior normal.     ED Results / Procedures / Treatments   Labs (all labs ordered are listed, but only abnormal results are displayed) Labs Reviewed  RESP PANEL BY RT-PCR (RSV, FLU A&B, COVID)  [id number]  BASIC METABOLIC PANEL  CBC  WITH DIFFERENTIAL/PLATELET  BRAIN NATRIURETIC PEPTIDE  TROPONIN I (HIGH SENSITIVITY)  TROPONIN I (HIGH SENSITIVITY)    EKG EKG Interpretation  Date/Time:  Thursday February 01 2023 19:15:03 EST Ventricular Rate:  89 PR Interval:  183 QRS Duration: 99 QT Interval:  393 QTC Calculation: 479 R Axis:   82 Text Interpretation: Sinus rhythm Borderline right axis deviation No significant change since last tracing Confirmed by Isla Pence [phone]) on 02/01/2023 7:27:35 PM  Radiology DG Chest Port 1 View  Result Date: 02/01/2023 CLINICAL DATA:  Shortness of breath EXAM: PORTABLE CHEST 1 VIEW COMPARISON:  Chest x-ray 06/05/2020 FINDINGS: Sternal wires. Hyperinflation. Left basilar atelectasis or scar is stable. There is dense left midlung nodule, likely calcified and also unchanged. No pneumothorax, edema. Normal cardiopericardial  silhouette with calcified aorta and sternal wires. Dextroconvex curvature of the midthoracic spine. Overlapping cardiac leads. IMPRESSION: Postop chest.  Left basilar atelectasis. Electronically Signed   By: Jill Side M.D.   On: 02/01/2023 19:38    Procedures Procedures    Medications Ordered in ED Medications  nitroGLYCERIN (NITROSTAT) SL tablet 0.4 mg (0.4 mg Sublingual Given 02/01/23 2206)  albuterol (VENTOLIN HFA) 108 (90 Base) MCG/ACT inhaler 1-2 puff (has no administration in time range)  AeroChamber Plus Flo-Vu Large MISC 1 each (has no administration in time range)  ipratropium-albuterol (DUONEB) 0.5-2.5 (3) MG/3ML nebulizer solution 3 mL (3 mLs Nebulization Given 02/01/23 1950)    ED Course/ Medical Decision Making/ A&P                             Medical Decision Making Amount and/or Complexity of Data Reviewed Labs: ordered. Radiology: ordered.  Risk Prescription drug management.   This patient presents to the ED for concern of sob, this involves an extensive number of treatment options, and is a complaint that carries with it a high risk of complications and morbidity.  The differential diagnosis includes cardiac, pulm, covid/flu/rsv   Co morbidities that complicate the patient evaluation  CAD, HTN, HLD, osteoporosis, and ischemic cardiomyopathy   Additional history obtained:  Additional history obtained from epic chart review External records from outside source obtained and reviewed including daughter   Lab Tests:  I Ordered, and personally interpreted labs.  The pertinent results include:  cbc nl, bmp nl, bnp nl, trop nl, covid/flu/rsv neg   Imaging Studies ordered:  I ordered imaging studies including cxr  I independently visualized and interpreted imaging which showed Postop chest.  Left basilar atelectasis.  I agree with the radiologist interpretation   Cardiac Monitoring:  The patient was maintained on a cardiac monitor.  I personally viewed  and interpreted the cardiac monitored which showed an underlying rhythm of: nsr   Medicines ordered and prescription drug management:  I ordered medication including duoneb  for sob; nitro had no effect Reevaluation of the patient after these medicines showed that the patient improved I have reviewed the patients home medicines and have made adjustments as needed   Critical Interventions:  Symptomatic improvement   Problem List / ED Course:  Sob:  likely rad due to exposure to pine needles.  Cardiac eval neg.  Pt feels much better.  She is stable for d/c.  Return if worse.  F/u with pcp/cards.   Reevaluation:  After the interventions noted above, I reevaluated the patient and found that they have :improved   Social Determinants of Health:  Lives at home   Dispostion:  After consideration  of the diagnostic results and the patients response to treatment, I feel that the patent would benefit from discharge with outpatient f/u.          Final Clinical Impression(s) / ED Diagnoses Final diagnoses:  Mild intermittent reactive airway disease with acute exacerbation  Diarrhea, unspecified type    Rx / DC Orders ED Discharge Orders     None         Isla Pence, MD 02/01/23 2318

## 2023-03-14 ENCOUNTER — Other Ambulatory Visit: Payer: Self-pay | Admitting: Pharmacist

## 2023-03-14 DIAGNOSIS — I25810 Atherosclerosis of coronary artery bypass graft(s) without angina pectoris: Secondary | ICD-10-CM

## 2023-03-14 DIAGNOSIS — E782 Mixed hyperlipidemia: Secondary | ICD-10-CM

## 2023-03-14 MED ORDER — PRALUENT 75 MG/ML ~~LOC~~ SOAJ: 75.0000 mg | SUBCUTANEOUS | 1 refills | Status: DC

## 2023-12-21 ENCOUNTER — Other Ambulatory Visit: Payer: Self-pay | Admitting: Cardiology

## 2024-01-14 NOTE — Progress Notes (Unsigned)
No chief complaint on file.  History of Present Illness: 83 yo female with history of CAD s/p CABG, chronic diastolic CHF, ischemic cardiomyopathy, HTN and HLD who is here today for follow up. She has been followed in our office by Dr. Katrinka Blazing. She was admitted in 2016 with an anterior STEMI and was found to have severe ostial LAD stenosis. LVEF was 35% during that admission. She had 2V CABG in 2016 (LIMA to LAD, SVG to Diagonal). Echo July 2016 with LVEF=60-65%. She has not tolerated Ace-inhibitors secondary to cough. *** ? Statin intolerant and now on Praluent.   She is here today for follow up. The patient denies any chest pain, dyspnea, palpitations, lower extremity edema, orthopnea, PND, dizziness, near syncope or syncope.   Primary Care Physician: Patient, No Pcp Per   Past Medical History:  Diagnosis Date    Remote Anterior Stemi 05/22/2015   CAD (coronary artery disease)    a. ant-lat STEMI 6/16 >> LHC:  ostial to mid LAD 99% with complex thrombus, mid to dist LAD 99%, D2 80%, prox RCA 50%, mid RCA 50%, EF 35% with ant-apical AK >>  b. s/p CABG (L-LAD, S-Dx)   Chronic diastolic heart failure (HCC) 02/12/2017   Elevated LFTs 08/26/2015   Essential hypertension    Gallstone    HLD (hyperlipidemia)    Hyperlipidemia 06/07/2015   Ischemic cardiomyopathy    a. echo 6/16:  EF 35%, mild LVH, periapical HK, Gr 2 DD;  b.  Echo 7/16:  Mild LVH, mild focal basal septal hypertrophy, EF 60-65%, no RWMA, trivial AI, MAC, mild increased PASP, trivial effusion   Osteoporosis    Pneumonia    Psoriasis    UTI (lower urinary tract infection)     Past Surgical History:  Procedure Laterality Date   CARDIAC CATHETERIZATION N/A 05/22/2015   Procedure: Left Heart Cath and Coronary Angiography;  Surgeon: Lyn Records, MD;  Location: [facility] INVASIVE CV LAB;  Service: Cardiovascular;  Laterality: N/A;   CARPAL TUNNEL RELEASE     CORONARY ARTERY BYPASS GRAFT     CORONARY ARTERY BYPASS GRAFT N/A 05/22/2015    Procedure: CORONARY ARTERY BYPASS GRAFTING (CABG);  Surgeon: [name], MD;  [facility];  Service: Open Heart Surgery;  Laterality: N/A;   TEE WITHOUT CARDIOVERSION  05/22/2015   Procedure: TRANSESOPHAGEAL ECHOCARDIOGRAM (TEE);  Surgeon: [name], MD;  Location: [facility] OR;  Service: Open Heart Surgery;;   TONSILLECTOMY     TUBAL LIGATION  1971   WRIST FRACTURE SURGERY Bilateral 11/26/2008   Open reduction    Current Outpatient Medications  Medication Sig Dispense Refill   Alirocumab (PRALUENT) 75 MG/ML SOAJ Inject 1 mL (75 mg total) into the skin every 14 (fourteen) days. 6 mL 1   amLODipine (NORVASC) 5 MG tablet TAKE 1 TABLET BY MOUTH EVERY DAY 30 tablet 0   aspirin EC 81 MG tablet Take 81 mg by mouth every other day. Swallow whole.     No current facility-administered medications for this visit.    Allergies  Allergen Reactions   Avelox [Moxifloxacin] Shortness Of Breath and Other (See Comments)    Irregular heart rate and mucoid stools also   Humira [Adalimumab] Other (See Comments)    Stopped working/ possible cardiac reaction   Lipitor [Atorvastatin] Other (See Comments)    Turned urine brown - abnormal labs   Ultram [Tramadol Hcl] Nausea Only   Codeine Nausea And Vomiting and Rash   Methotrexate Derivatives Nausea  And Vomiting   Sulfa Antibiotics Nausea Only and Rash   Statins Other (See Comments)    Urine turn very dark brown and her labs came back alarming (per the patient)   Monosodium Glutamate Other (See Comments)    headache   Sorbitol Diarrhea    Social History   Socioeconomic History   Marital status: Married    Spouse name: Not on file   Number of children: 3   Years of education: Assoc   Highest education level: Not on file  Occupational History   Occupation: retired Engineer, civil (consulting)  Tobacco Use   Smoking status: Former    Current packs/day: 0.00    Average packs/day: 0.5 packs/day for 50.0 years (25.0 ttl pk-yrs)    Types: Cigarettes     Start date: 05/22/1951    Quit date: 05/21/2001    Years since quitting: 22.6   Smokeless tobacco: Never  Vaping Use   Vaping status: Never Used  Substance and Sexual Activity   Alcohol use: No    Alcohol/week: 0.0 standard drinks of alcohol    Comment: 4x/yr   Drug use: No   Sexual activity: Not on file  Other Topics Concern   Not on file  Social History Narrative   Lives with her daughter, takes care of her own ADLs and iADLs   Right-handed   Caffeine: 3 cups per day   Social Drivers of Corporate investment banker Strain: Not on file  Food Insecurity: Not on file  Transportation Needs: Not on file  Physical Activity: Not on file  Stress: Not on file  Social Connections: Not on file  Intimate Partner Violence: Not on file    Family History  Problem Relation Age of Onset   Heart disease Father    Urolithiasis Father    Pancreatic disease Mother    Heart failure Mother    Liver cancer Mother    Cancer Paternal Grandmother    Heart attack Paternal Grandfather    Heart block Sister    Rheum arthritis Sister    Hypertension Sister    Stroke Sister    Prostate cancer Son    Arthritis Son        Psoriatic    Review of Systems:  As stated in the HPI and otherwise negative.   There were no vitals taken for this visit.  Physical Examination: General: Well developed, well nourished, NAD  HEENT: OP clear, mucus membranes moist  SKIN: warm, dry. No rashes. Neuro: No focal deficits  Musculoskeletal: Muscle strength 5/5 all ext  Psychiatric: Mood and affect normal  Neck: No JVD, no carotid bruits, no thyromegaly, no lymphadenopathy.  Lungs:Clear bilaterally, no wheezes, rhonci, crackles Cardiovascular: Regular rate and rhythm. No murmurs, gallops or rubs. Abdomen:Soft. Bowel sounds present. Non-tender.  Extremities: No lower extremity edema. Pulses are 2 + in the bilateral DP/PT.  EKG:  EKG {ACTION; IS/IS [id number]} ordered today. The ekg ordered today  demonstrates ***  Recent Labs: 02/01/2023: B Natriuretic Peptide 24.4; BUN 17; Creatinine, Ser 0.80; Hemoglobin 12.5; Platelets 273; Potassium 3.5; Sodium 139   Lipid Panel    Component Value Date/Time   CHOL 174 11/13/2022 1100   TRIG 56 11/13/2022 1100   HDL 84 11/13/2022 1100   CHOLHDL 2.1 11/13/2022 1100   CHOLHDL 2.8 03/27/2017 2012   VLDL 12 03/27/2017 2012   LDLCALC 79 11/13/2022 1100   LDLDIRECT 124 (H) 03/19/2017 0000     Wt Readings from Last 3 Encounters:  11/13/22 58.1  kg  10/12/21 57.9 kg  05/07/20 57.8 kg    Assessment and Plan:   1. CAD s/p CABG without angina: No chest pain suggestive of angina. Continue ASA. She is statin intolerant and is on Praluent.   2. HTN: BP is well controlled. Continue Norvasc.   3. Hyperlipidemia: LDL ***. Continue Praluent.   Labs/ tests ordered today include:  No orders of the defined types were placed in this encounter.  Disposition:   F/U with me in 12 months   Signed, Verne Carrow, MD, [facility] 01/14/2024 2:35 PM    [facility] [address] [place], [place], [place]  [address] Phone: [phone]; Fax: [phone]

## 2024-01-15 ENCOUNTER — Ambulatory Visit: Payer: Medicare Other | Attending: Cardiovascular Disease | Admitting: Cardiovascular Disease

## 2024-01-15 ENCOUNTER — Other Ambulatory Visit: Payer: Self-pay | Admitting: Cardiovascular Disease

## 2024-01-15 ENCOUNTER — Encounter: Payer: Self-pay | Admitting: Cardiovascular Disease

## 2024-01-15 VITALS — BP 122/70 | HR 95 | Ht 61.0 in | Wt 129.0 lb

## 2024-01-15 DIAGNOSIS — E782 Mixed hyperlipidemia: Secondary | ICD-10-CM

## 2024-01-15 DIAGNOSIS — I2510 Atherosclerotic heart disease of native coronary artery without angina pectoris: Secondary | ICD-10-CM

## 2024-01-15 DIAGNOSIS — I10 Essential (primary) hypertension: Secondary | ICD-10-CM

## 2024-01-15 DIAGNOSIS — R011 Cardiac murmur, unspecified: Secondary | ICD-10-CM | POA: Diagnosis not present

## 2024-01-15 NOTE — Patient Instructions (Addendum)
Medication Instructions:  No changes *If you need a refill on your cardiac medications before your next appointment, please call your pharmacy*   Lab Work: Lipids - go to any [facility] If you have labs (blood work) drawn today and your tests are completely normal, you will receive your results only by: MyChart Message (if you have MyChart) OR A paper copy in the mail If you have any lab test that is abnormal or we need to change your treatment, we will call you to review the results.   Testing/Procedures: Your physician has requested that you have an echocardiogram. Echocardiography is a painless test that uses sound waves to create images of your heart. It provides your doctor with information about the size and shape of your heart and how well your heart's chambers and valves are working. This procedure takes approximately one hour. There are no restrictions for this procedure. Please do NOT wear cologne, perfume, aftershave, or lotions (deodorant is allowed). Please arrive 15 minutes prior to your appointment time.  Please note: We ask at that you not bring children with you during ultrasound (echo/ vascular) testing. Due to room size and safety concerns, children are not allowed in the ultrasound rooms during exams. Our front office staff cannot provide observation of children in our lobby area while testing is being conducted. An adult accompanying a patient to their appointment will only be allowed in the ultrasound room at the discretion of the ultrasound technician under special circumstances. We apologize for any inconvenience.   Follow-Up: At [facility], you and your health needs are our priority.  As part of our continuing mission to provide you with exceptional heart care, we have created designated Provider Care Teams.  These Care Teams include your primary Cardiologist (physician) and Advanced Practice Providers (APPs -  Physician Assistants and Nurse Practitioners) who  all work together to provide you with the care you need, when you need it.   Your next appointment:   12 month(s)  Provider:   Verne Carrow, MD     [facility] Contact for Alternative locations and appointment scheduling   [email or web address]   [email or web address]  [phone]

## 2024-01-17 LAB — LIPID PANEL
Chol/HDL Ratio: 2.6 {ratio} (ref 0.0–4.4)
Cholesterol, Total: 194 mg/dL (ref 100–199)
HDL: 75 mg/dL (ref >39)
LDL Chol Calc (NIH): 100 mg/dL — ABNORMAL HIGH (ref 0–99)
Triglycerides: 110 mg/dL (ref 0–149)
VLDL Cholesterol Cal: 19 mg/dL (ref 5–40)

## 2024-01-21 ENCOUNTER — Telehealth: Payer: Self-pay

## 2024-01-21 DIAGNOSIS — Z79899 Other long term (current) drug therapy: Secondary | ICD-10-CM

## 2024-01-21 DIAGNOSIS — E782 Mixed hyperlipidemia: Secondary | ICD-10-CM

## 2024-01-21 NOTE — Telephone Encounter (Signed)
 Called and spoke with patient who states that she is having a severe psoriasis outbreak and withheld taking her Praluent for the last 2 weeks. She believes this has skewed her results. She wishes to repeat labs when she comes in for her ECHO and if in fact her LDL is still up, says she will be glad to start the zetia. Repeat lipids ordered and released at this time.

## 2024-01-21 NOTE — Telephone Encounter (Signed)
-----   Message from Verne Carrow sent at 01/17/2024 10:25 AM EST ----- LDL above goal of 70 but she does not wish to add a statin to her Praluent. Can we see if she would be willing to add Zetia 10 mg daily and continue Praluent? I discussed this with her at her visit and told her it would be an option if her LDL was higher than goal. Thanks, chris

## 2024-02-07 ENCOUNTER — Ambulatory Visit (HOSPITAL_COMMUNITY): Payer: Medicare Other | Attending: Internal Medicine

## 2024-02-07 DIAGNOSIS — R011 Cardiac murmur, unspecified: Secondary | ICD-10-CM | POA: Diagnosis present

## 2024-02-07 LAB — ECHOCARDIOGRAM COMPLETE
Area-P 1/2: 2.14 cm2
S' Lateral: 2.2 cm

## 2024-02-07 LAB — LIPID PANEL
Chol/HDL Ratio: 2.2 ratio (ref 0.0–4.4)
Cholesterol, Total: 168 mg/dL (ref 100–199)
HDL: 78 mg/dL (ref >39)
LDL Chol Calc (NIH): 76 mg/dL (ref 0–99)
Triglycerides: 75 mg/dL (ref 0–149)
VLDL Cholesterol Cal: 14 mg/dL (ref 5–40)

## 2024-02-07 LAB — ALT: ALT: 11 IU/L (ref 0–32)

## 2024-02-18 ENCOUNTER — Other Ambulatory Visit: Payer: Self-pay | Admitting: *Deleted

## 2024-02-18 DIAGNOSIS — E785 Hyperlipidemia, unspecified: Secondary | ICD-10-CM

## 2024-02-18 NOTE — Progress Notes (Signed)
 Per Dr. Clifton James - repeat lipids after resuming Praluent.  Pt prefers to give her body a little more time and will come the first week of May to recheck LDL.

## 2024-03-07 ENCOUNTER — Ambulatory Visit: Payer: Medicare Other | Admitting: Family Medicine

## 2024-04-09 ENCOUNTER — Other Ambulatory Visit: Payer: Self-pay | Admitting: Pharmacist

## 2024-04-09 ENCOUNTER — Other Ambulatory Visit (HOSPITAL_COMMUNITY): Payer: Self-pay

## 2024-04-09 ENCOUNTER — Other Ambulatory Visit: Payer: Self-pay | Admitting: Nurse Practitioner

## 2024-04-09 ENCOUNTER — Telehealth: Payer: Self-pay | Admitting: Pharmacy Technician

## 2024-04-09 DIAGNOSIS — I25810 Atherosclerosis of coronary artery bypass graft(s) without angina pectoris: Secondary | ICD-10-CM

## 2024-04-09 DIAGNOSIS — E782 Mixed hyperlipidemia: Secondary | ICD-10-CM

## 2024-04-09 NOTE — Telephone Encounter (Signed)
 Patient Advocate Encounter   The patient was approved for a [employer] grant that will help cover the cost of praluent  Total amount awarded, 2500.  Effective: 03/10/24 - 03/09/25   [id number] KGM:[id number] [id number] [employer] ID: [id number]   Pharmacy provided with approval and processing information. Patient informed via telephone/mychart

## 2024-04-09 NOTE — Telephone Encounter (Signed)
 Pharmacy Patient Advocate Encounter  Received notification from [employer] that Prior Authorization for praluent  has been APPROVED from 04/09/24 to 10/10/24. Spoke to pharmacy to process.Copay is $684.00 for 90 days.    PA #/Case ID/Reference #: [id number]   I got her a grant

## 2024-04-09 NOTE — Telephone Encounter (Signed)
 Pharmacy Patient Advocate Encounter   Received notification from CoverMyMeds that prior authorization for praluent  is required/requested.   Insurance verification completed.   The patient is insured through [facility] .   Per test claim: PA required; PA submitted to above mentioned insurance via CoverMyMeds Key/confirmation #/EOC [id number] Status is pending

## 2024-05-05 ENCOUNTER — Encounter (HOSPITAL_COMMUNITY): Payer: Self-pay | Admitting: Emergency Medicine

## 2024-05-05 ENCOUNTER — Ambulatory Visit (HOSPITAL_COMMUNITY)
Admission: EM | Admit: 2024-05-05 | Discharge: 2024-05-05 | Disposition: A | Discharge status: Home / Self Care | Attending: Nurse Practitioner | Admitting: Nurse Practitioner

## 2024-05-05 DIAGNOSIS — W57XXXA Bitten or stung by nonvenomous insect and other nonvenomous arthropods, initial encounter: Secondary | ICD-10-CM | POA: Diagnosis not present

## 2024-05-05 DIAGNOSIS — S70361A Insect bite (nonvenomous), right thigh, initial encounter: Secondary | ICD-10-CM

## 2024-05-05 MED ORDER — DOXYCYCLINE HYCLATE 100 MG PO CAPS: 200.0000 mg | ORAL_CAPSULE | Freq: Once | ORAL | 0 refills | Status: AC

## 2024-05-05 NOTE — ED Triage Notes (Signed)
 Pt reports that she worked in her garden. Noticed bug bit, possible tick on upper right posterior leg. Reports itching.

## 2024-05-05 NOTE — Discharge Instructions (Signed)
 You were seen today for a possible tick bite on your upper right thigh. The area appears more consistent with an insect bite, such as a mosquito, rather than a tick. A one-time preventive dose of doxycycline  was given due to your history of [facility] spotted fever. You do not currently have signs of tick-related illness. Keep the area clean and dry, and you may apply over-the-counter hydrocortisone cream or take an antihistamine like Benadryl  or Zyrtec if the site is itchy or irritated. Monitor for any new symptoms including fever, chills, muscle aches, headache, neck stiffness, or a bull's-eye rash around the bite site. Contact your primary care provider if any of these symptoms develop. Seek immediate care in the emergency department if you experience severe headache, high fever, confusion, rash spreading across the body, or difficulty moving your neck. If no symptoms develop, no further treatment is needed.

## 2024-05-05 NOTE — ED Provider Notes (Signed)
 [facility]    CSN: [id number] Arrival date & time: 05/05/24  1143      History   Chief Complaint Chief Complaint  Patient presents with   Insect Bite    HPI Dana Palmer is a 83 y.o. female.   Dana Palmer is a 83 y.o. 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 She reports feeling a bump and initially thought she saw a dark spot when attempting to examine the area with a mirror.  The patient states she gardens daily and has recently had three large golden doodle dogs visiting for a week, which she believes may have increased her exposure risk. She mentions feeling an itchy lump in the area of concern but denies any systemic symptoms such as fever, chills, body aches, headache, neck pain, or neck stiffness. The patient expresses anxiety about the possibility of another tick-borne illness, referencing her previous experience with [facility] spotted fever that required medication.  The patient reports being unable to remove the suspected tick due to its location. She states she can typically manage tick removal independently when they are in reachable areas. The patient mentions she is willing to forgo preventive medication unless necessary but agrees to a one-time dose of doxycycline  as a precautionary measure given her history.    Past Medical History:  Diagnosis Date    Remote Anterior Stemi 05/22/2015   CAD (coronary artery disease)    a. ant-lat STEMI 6/16 >> LHC:  ostial to mid LAD 99% with complex thrombus, mid to dist LAD 99%, D2 80%, prox RCA 50%, mid RCA 50%, EF 35% with ant-apical AK >>  b. s/p CABG (L-LAD, S-Dx)   Chronic diastolic heart failure (HCC) 02/12/2017   Elevated LFTs 08/26/2015   Essential hypertension    Gallstone    HLD (hyperlipidemia)    Hyperlipidemia 06/07/2015   Ischemic cardiomyopathy    a. echo 6/16:  EF 35%, mild  LVH, periapical HK, Gr 2 DD;  b.  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   Essential hypertension 06/07/2015   Hyperlipidemia 06/07/2015    Remote Anterior Stemi 05/22/2015   Psoriasis 05/22/2015    Past Surgical History:  Procedure Laterality Date   CARDIAC CATHETERIZATION N/A 05/22/2015   Procedure: Left Heart Cath and Coronary Angiography;  Surgeon: Arty Binning, MD;  Location: [facility] INVASIVE CV LAB;  Service: Cardiovascular;  Laterality: N/A;   CARPAL TUNNEL RELEASE     CORONARY ARTERY BYPASS GRAFT     CORONARY ARTERY BYPASS GRAFT N/A 05/22/2015   Procedure: CORONARY ARTERY BYPASS GRAFTING (CABG);  Surgeon: Zelphia Higashi, MD;  Location: [facility] OR;  Service: Open Heart Surgery;  Laterality: N/A;   TEE WITHOUT CARDIOVERSION  05/22/2015   Procedure: TRANSESOPHAGEAL ECHOCARDIOGRAM (TEE);  Surgeon: Zelphia Higashi, MD;  Location: [facility] OR;  Service: Open Heart Surgery;;   TONSILLECTOMY     TUBAL LIGATION  1971   WRIST FRACTURE SURGERY Bilateral 11/26/2008   Open reduction    OB History   No obstetric history on file.      Home Medications    Prior to Admission medications   Medication Sig Start Date End Date Taking? Authorizing Provider  doxycycline  (VIBRAMYCIN ) 100 MG capsule Take 2 capsules (200 mg total) by mouth once for 1 dose. 05/05/24 05/05/24 Yes Maryruth Sol, FNP  Alirocumab  (PRALUENT ) 75 MG/ML SOAJ INJECT INTO THE SKIN EVERY 14 DAYS 04/09/24   Odie Benne, MD  amLODipine  (NORVASC ) 5 MG tablet TAKE 1 TABLET  BY MOUTH EVERY DAY 01/16/24   Odie Benne, MD  aspirin  EC 81 MG tablet Take 81 mg by mouth every other day. Swallow whole.    [provider]    Family History Family History  Problem Relation Age of Onset   Heart disease Father    Urolithiasis Father    Pancreatic disease Mother    Heart failure Mother    Liver cancer Mother    Cancer Paternal Grandmother    Heart attack Paternal Grandfather    Heart block Sister    Rheum arthritis Sister    Hypertension Sister    Stroke Sister    Prostate cancer Son    Arthritis Son        Psoriatic    Social History Social History   Tobacco Use   Smoking status: Former    Current packs/day: 0.00    Average packs/day: 0.5 packs/day for 50.0 years (25.0 ttl pk-yrs)    Types: Cigarettes    Start date: 05/22/1950    Quit date: 05/21/2001    Years since quitting: 22.9   Smokeless tobacco: Never  Vaping Use   Vaping status: Never Used  Substance Use Topics   Alcohol use: No    Alcohol/week: 0.0 standard drinks of alcohol    Comment: 4x/yr   Drug use: No     Allergies   Avelox [moxifloxacin], Humira [adalimumab], Lipitor [atorvastatin ], Ultram  [tramadol  hcl], Codeine, Methotrexate derivatives, Sulfa antibiotics, Statins, Monosodium glutamate, and Sorbitol   Review of Systems Review of Systems   Physical Exam Triage Vital Signs ED Triage Vitals  Encounter Vitals Group     BP 05/05/24 1320 134/70     Systolic BP Percentile --      Diastolic BP Percentile --      Pulse Rate 05/05/24 1320 79     Resp 05/05/24 1320 18     Temp 05/05/24 1320 98.1 F (36.7 C)     Temp Source 05/05/24 1320 Oral     SpO2 05/05/24 1320 96 %     Weight --      Height --      Head Circumference --      Peak Flow --      Pain Score 05/05/24 1319 0     Pain Loc --      Pain Education --      Exclude from Growth Chart --    No data found.  Updated Vital Signs BP 134/70 (BP Location: Left Arm)   Pulse 79   Temp 98.1 F  (36.7 C) (Oral)   Resp 18   SpO2 96%   Visual Acuity Right Eye Distance:   Left Eye Distance:   Bilateral Distance:    Right Eye Near:   Left Eye Near:    Bilateral Near:     Physical Exam Vitals reviewed.  Constitutional:  General: She is not in acute distress.    Appearance: Normal appearance. She is not toxic-appearing.  HENT:     Head: Normocephalic.     Mouth/Throat:     Mouth: Mucous membranes are moist.  Eyes:     Conjunctiva/sclera: Conjunctivae normal.  Cardiovascular:     Rate and Rhythm: Normal rate and regular rhythm.     Heart sounds: Normal heart sounds.  Pulmonary:     Effort: Pulmonary effort is normal.     Breath sounds: Normal breath sounds.  Musculoskeletal:        General: Normal range of motion.  Skin:    General: Skin is warm and dry.     Comments: Small, rounded, erythematous papule with a central punctum, without surrounding induration, significant swelling, or drainage. No embedded tick or retained parts were visualized.   Neurological:     General: No focal deficit present.     Mental Status: She is alert and oriented to person, place, and time.      UC Treatments / Results  Labs (all labs ordered are listed, but only abnormal results are displayed) Labs Reviewed - No data to display  EKG   Radiology No results found.  Procedures Procedures (including critical care time)  Medications Ordered in UC Medications - No data to display  Initial Impression / Assessment and Plan / UC Course  I have reviewed the triage vital signs and the nursing notes.  Pertinent labs & imaging results that were available during my care of the patient were reviewed by me and considered in my medical decision making (see chart for details).     Patient presented with concern for a possible tick bite on the posterior right upper thigh, first noticed this morning. Exam revealed a small bump with a central punctum, appearing more consistent with a  mosquito or other insect bite. Patient reports frequent gardening and recent contact with dogs, increasing insect exposure risk. No symptoms suggestive of tick-borne illness were reported, and physical exam was unremarkable. Given her history of [facility] spotted fever, a one-time prophylactic dose of doxycycline  200 mg PO was administered. Patient was advised to monitor for symptoms such as fever, chills, body aches, headache, neck pain or stiffness, or bull's-eye rash. No further treatment is needed if symptoms do not develop.  Today's evaluation has revealed no signs of a dangerous process. Discussed diagnosis with patient and/or guardian. Patient and/or guardian aware of their diagnosis, possible red flag symptoms to watch out for and need for close follow up. Patient and/or guardian understands verbal and written discharge instructions. Patient and/or guardian comfortable with plan and disposition.  Patient and/or guardian has a clear mental status at this time, good insight into illness (after discussion and teaching) and has clear judgment to make decisions regarding their care  Documentation was completed with the aid of voice recognition software. Transcription may contain typographical errors. Final Clinical Impressions(s) / UC Diagnoses   Final diagnoses:  Insect bite of right thigh, initial encounter     Discharge Instructions      You were seen today for a possible tick bite on your upper right thigh. The area appears more consistent with an insect bite, such as a mosquito, rather than a tick. A one-time preventive dose of doxycycline  was given due to your history of [facility] spotted fever. You do not currently have signs of tick-related illness. Keep the area clean and dry, and you may apply over-the-counter hydrocortisone cream or take  an antihistamine like Benadryl  or Zyrtec if the site is itchy or irritated. Monitor for any new symptoms including fever, chills, muscle  aches, headache, neck stiffness, or a bull's-eye rash around the bite site. Contact your primary care provider if any of these symptoms develop. Seek immediate care in the emergency department if you experience severe headache, high fever, confusion, rash spreading across the body, or difficulty moving your neck. If no symptoms develop, no further treatment is needed.    ED Prescriptions     Medication Sig Dispense Auth. Provider   doxycycline  (VIBRAMYCIN ) 100 MG capsule Take 2 capsules (200 mg total) by mouth once for 1 dose. 2 capsule Maryruth Sol, FNP      PDMP not reviewed this encounter.   Maryruth Sol, [place] 05/05/24 1425

## 2024-06-19 ENCOUNTER — Other Ambulatory Visit: Payer: Self-pay

## 2024-06-19 ENCOUNTER — Encounter (HOSPITAL_COMMUNITY): Payer: Self-pay | Admitting: *Deleted

## 2024-06-19 ENCOUNTER — Ambulatory Visit (HOSPITAL_COMMUNITY)
Admission: EM | Admit: 2024-06-19 | Discharge: 2024-06-19 | Disposition: A | Discharge status: Home / Self Care | Attending: Family Medicine | Admitting: Family Medicine

## 2024-06-19 DIAGNOSIS — U071 COVID-19: Secondary | ICD-10-CM | POA: Diagnosis not present

## 2024-06-19 MED ORDER — NIRMATRELVIR/RITONAVIR (PAXLOVID)TABLET: ORAL_TABLET | ORAL | 0 refills | Status: DC

## 2024-06-19 MED ORDER — ONDANSETRON 4 MG PO TBDP: 4.0000 mg | ORAL_TABLET | Freq: Three times a day (TID) | ORAL | 0 refills | Status: DC | PRN

## 2024-06-19 MED ORDER — BENZONATATE 100 MG PO CAPS: 100.0000 mg | ORAL_CAPSULE | Freq: Three times a day (TID) | ORAL | 0 refills | Status: DC | PRN

## 2024-06-19 NOTE — Discharge Instructions (Signed)
 Take nirmatrelvir  150 mg --2 tablets twice daily for 5 days, plus also ritonavir  100 mg--1 tablet twice daily for 5 days.  These are antiviral medicines, meant to keep you from getting worse with a COVID-19 infection  Ondansetron  dissolved in the mouth every 8 hours as needed for nausea or vomiting. Clear liquids(water, gatorade/pedialyte, ginger ale/sprite, chicken broth/soup) and bland things(crackers/toast, rice, potato, bananas) to eat. Avoid acidic foods like lemon/lime/orange/tomato, and avoid greasy/spicy foods.  Take benzonatate  100 mg, 1 tab every 8 hours as needed for cough.  Make sure you are drinking plenty of fluids and eating a little bit of food at least

## 2024-06-19 NOTE — ED Triage Notes (Addendum)
 C/O flying in from [facility] last night, starting with rhinorrhea. Had positive home Covid test this afternoon.  C/O fever, nasal congestion, HA, generalized body aches. Has taken IBU - last dose @ 1600. Took Benadryl  last night.

## 2024-06-19 NOTE — ED Provider Notes (Signed)
 [facility]    CSN: [id number] Arrival date & time: 06/19/24  1817      History   Chief Complaint Chief Complaint  Patient presents with   Cough   Headache   Fever    Covid Positive    HPI Dana Palmer is a 83 y.o. female.    Cough Associated symptoms: fever and headaches   Headache Associated symptoms: cough and fever   Fever Associated symptoms: cough and headaches   Here for rhinorrhea and nasal congestion and headache and chills.  She is possibly also had some fever.  Symptoms began at midnight last night after she had returned from a trip to [place]  to see her brother-in-law in the hospital.  She did a home COVID test today that was positive.  She is nauseated but no vomiting or diarrhea.  She does not have any shortness of breath or wheezing.  She is allergic to Avelox and Humira and Lipitor and Ultram  and codeine and methotrexate and sulfa and statins  Last EGFR was greater than 60 in 2024.  Past Medical History:  Diagnosis Date    Remote Anterior Stemi 05/22/2015   CAD (coronary artery disease)    a. ant-lat STEMI 6/16 >> LHC:  ostial to mid LAD 99% with complex thrombus, mid to dist LAD 99%, D2 80%, prox RCA 50%, mid RCA 50%, EF 35% with ant-apical AK >>  b. s/p CABG (L-LAD, S-Dx)   Chronic diastolic heart failure (HCC) 02/12/2017   Elevated LFTs 08/26/2015   Essential hypertension    Gallstone    HLD (hyperlipidemia)    Hyperlipidemia 06/07/2015   Ischemic cardiomyopathy    a. echo 6/16:  EF 35%, mild LVH, periapical HK, Gr 2 DD;  b.  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 06/07/2015   Essential hypertension 06/07/2015   Hyperlipidemia 06/07/2015    Remote Anterior Stemi 05/22/2015   Psoriasis 05/22/2015    Past Surgical History:  Procedure Laterality Date   CARDIAC CATHETERIZATION N/A 05/22/2015   Procedure: Left Heart Cath and Coronary Angiography;  Surgeon: Victory LELON Sharps, MD;  Location: [facility] INVASIVE CV LAB;  Service: Cardiovascular;  Laterality: N/A;   CARPAL TUNNEL RELEASE     CORONARY ARTERY BYPASS GRAFT     CORONARY ARTERY BYPASS GRAFT N/A 05/22/2015   Procedure: CORONARY ARTERY BYPASS GRAFTING (CABG);  Surgeon: Elspeth JAYSON Millers, MD;  Location: [facility] OR;  Service: Open Heart Surgery;  Laterality: N/A;   TEE WITHOUT CARDIOVERSION  05/22/2015   Procedure: TRANSESOPHAGEAL ECHOCARDIOGRAM (TEE);  Surgeon: Elspeth JAYSON Millers, MD;  Location: [facility] OR;  Service: Open Heart Surgery;;   TONSILLECTOMY     TUBAL LIGATION  1971   WRIST FRACTURE SURGERY Bilateral 11/26/2008   Open reduction    OB History   No obstetric history on file.      Home Medications    Prior to Admission medications   Medication Sig Start Date End Date Taking? Authorizing Provider  Alirocumab  (PRALUENT ) 75 MG/ML SOAJ INJECT INTO THE SKIN  EVERY 14 DAYS 04/09/24  Yes Verlin Lonni BIRCH, MD  amLODipine  (NORVASC ) 5 MG tablet TAKE 1 TABLET BY MOUTH EVERY DAY 01/16/24  Yes Verlin Lonni BIRCH, MD  aspirin  EC 81 MG tablet Take 81 mg by mouth every other day. Swallow whole.   Yes [provider]  benzonatate  (TESSALON ) 100 MG capsule Take 1 capsule (100 mg total) by mouth 3 (three) times daily as needed for cough. 06/19/24  Yes Vonna Sharlet POUR, MD  nirmatrelvir /ritonavir  (PAXLOVID ) 20 x 150 MG & 10 x 100MG  TABS Patient GFR is >60. Take nirmatrelvir  (150 mg) two tablets twice daily for 5 days and ritonavir  (100 mg) one tablet twice daily for 5 days. 06/19/24  Yes Vonna Sharlet POUR, MD  ondansetron   (ZOFRAN -ODT) 4 MG disintegrating tablet Take 1 tablet (4 mg total) by mouth every 8 (eight) hours as needed for nausea or vomiting. 06/19/24  Yes Foch Rosenwald, Sharlet POUR, MD    Family History Family History  Problem Relation Age of Onset   Heart disease Father    Urolithiasis Father    Pancreatic disease Mother    Heart failure Mother    Liver cancer Mother    Cancer Paternal Grandmother    Heart attack Paternal Grandfather    Heart block Sister    Rheum arthritis Sister    Hypertension Sister    Stroke Sister    Prostate cancer Son    Arthritis Son        Psoriatic    Social History Social History   Tobacco Use   Smoking status: Former    Current packs/day: 0.00    Average packs/day: 0.5 packs/day for 50.0 years (25.0 ttl pk-yrs)    Types: Cigarettes    Start date: 05/22/1951    Quit date: 05/21/2001    Years since quitting: 23.0   Smokeless tobacco: Never  Vaping Use   Vaping status: Never Used  Substance Use Topics   Alcohol use: No    Alcohol/week: 0.0 standard drinks of alcohol    Comment: 4x/yr   Drug use: No     Allergies   Avelox [moxifloxacin], Humira [adalimumab], Lipitor [atorvastatin ], Ultram  [tramadol  hcl], Codeine, Methotrexate derivatives, Sulfa antibiotics, Statins, Monosodium glutamate, and Sorbitol   Review of Systems Review of Systems  Constitutional:  Positive for fever.  Respiratory:  Positive for cough.   Neurological:  Positive for headaches.     Physical Exam Triage Vital Signs ED Triage Vitals  Encounter Vitals Group     BP 06/19/24 1855 (!) 157/81     Girls Systolic BP Percentile --      Girls Diastolic BP Percentile --      Boys Systolic BP Percentile --      Boys Diastolic BP Percentile --      Pulse Rate 06/19/24 1854 (!) 101     Resp 06/19/24 1854 20     Temp 06/19/24 1854 98.4 F (36.9 C)     Temp Source 06/19/24 1854 Oral     SpO2 06/19/24 1854 93 %     Weight --      Height --      Head Circumference --      Peak Flow  --      Pain Score 06/19/24 1855 8     Pain Loc --      Pain Education --      Exclude from Growth Chart --    No data found.  Updated Vital Signs BP (!) 157/81  Pulse (!) 101   Temp 98.4 F (36.9 C) (Oral)   Resp 20   SpO2 93%   Visual Acuity Right Eye Distance:   Left Eye Distance:   Bilateral Distance:    Right Eye Near:   Left Eye Near:    Bilateral Near:     Physical Exam Vitals reviewed.  Constitutional:      General: She is not in acute distress.    Appearance: She is not toxic-appearing.  HENT:     Nose: Congestion present.     Mouth/Throat:     Mouth: Mucous membranes are moist.     Pharynx: No oropharyngeal exudate or posterior oropharyngeal erythema.  Eyes:     Extraocular Movements: Extraocular movements intact.     Conjunctiva/sclera: Conjunctivae normal.     Pupils: Pupils are equal, round, and reactive to light.  Cardiovascular:     Rate and Rhythm: Normal rate and regular rhythm.     Heart sounds: No murmur heard. Pulmonary:     Effort: Pulmonary effort is normal. No respiratory distress.     Breath sounds: No stridor. No wheezing, rhonchi or rales.  Abdominal:     Palpations: Abdomen is soft.     Tenderness: There is no abdominal tenderness.  Musculoskeletal:     Cervical back: Neck supple.  Lymphadenopathy:     Cervical: No cervical adenopathy.  Skin:    Capillary Refill: Capillary refill takes less than 2 seconds.     Coloration: Skin is not jaundiced or pale.  Neurological:     General: No focal deficit present.     Mental Status: She is alert and oriented to person, place, and time.  Psychiatric:        Behavior: Behavior normal.      UC Treatments / Results  Labs (all labs ordered are listed, but only abnormal results are displayed) Labs Reviewed - No data to display  EKG   Radiology No results found.  Procedures Procedures (including critical care time)  Medications Ordered in UC Medications - No data to  display  Initial Impression / Assessment and Plan / UC Course  I have reviewed the triage vital signs and the nursing notes.  Pertinent labs & imaging results that were available during my care of the patient were reviewed by me and considered in my medical decision making (see chart for details).     Paxlovid  is sent in for her COVID infection as she is at risk for severe disease with her advanced age.  eGFR was normal when last done in our system.  Tessalon  Perles and Zofran  are sent in for symptom relief.  I have asked her to push oral fluids. Final Clinical Impressions(s) / UC Diagnoses   Final diagnoses:  COVID-19     Discharge Instructions      Take nirmatrelvir  150 mg --2 tablets twice daily for 5 days, plus also ritonavir  100 mg--1 tablet twice daily for 5 days.  These are antiviral medicines, meant to keep you from getting worse with a COVID-19 infection  Ondansetron  dissolved in the mouth every 8 hours as needed for nausea or vomiting. Clear liquids(water, gatorade/pedialyte, ginger ale/sprite, chicken broth/soup) and bland things(crackers/toast, rice, potato, bananas) to eat. Avoid acidic foods like lemon/lime/orange/tomato, and avoid greasy/spicy foods.  Take benzonatate  100 mg, 1 tab every 8 hours as needed for cough.  Make sure you are drinking plenty of fluids and eating a little bit of food at least     ED Prescriptions  Medication Sig Dispense Auth. Provider   nirmatrelvir /ritonavir  (PAXLOVID ) 20 x 150 MG & 10 x 100MG  TABS Patient GFR is >60. Take nirmatrelvir  (150 mg) two tablets twice daily for 5 days and ritonavir  (100 mg) one tablet twice daily for 5 days. 30 tablet Onesti Bonfiglio, Sharlet POUR, MD   ondansetron  (ZOFRAN -ODT) 4 MG disintegrating tablet Take 1 tablet (4 mg total) by mouth every 8 (eight) hours as needed for nausea or vomiting. 10 tablet Vonna Sharlet POUR, MD   benzonatate  (TESSALON ) 100 MG capsule Take 1 capsule (100 mg total) by mouth 3 (three)  times daily as needed for cough. 21 capsule Camela Wich K, MD      PDMP not reviewed this encounter.   Vonna Sharlet POUR, MD 06/19/24 [phone]

## 2024-08-13 ENCOUNTER — Other Ambulatory Visit: Payer: Self-pay | Admitting: Medical Genetics

## 2024-08-14 ENCOUNTER — Other Ambulatory Visit (HOSPITAL_COMMUNITY)
Admission: RE | Admit: 2024-08-14 | Discharge: 2024-08-14 | Disposition: A | Discharge status: Home / Self Care | Payer: Self-pay | Source: Ambulatory Visit | Attending: Medical Genetics | Admitting: Medical Genetics

## 2024-08-15 ENCOUNTER — Ambulatory Visit: Admitting: Adult Health

## 2024-08-21 ENCOUNTER — Ambulatory Visit: Payer: Self-pay

## 2024-08-21 NOTE — Telephone Encounter (Signed)
 FYI Only or Action Required?: FYI only for provider.  Patient was last seen in primary care on na.  Called Nurse Triage reporting Diarrhea.  Symptoms began about a month ago.  Interventions attempted: Nothing.  Symptoms are: unchanged.  Triage Disposition: See Physician Within 24 Hours  Patient/caregiver understands and will follow disposition?: Yes  Worsening Uncontrolled diarrhea  The patient has uncontrollable diarrhea for the pass few months and believes it is a side effect of both a combination of her baby aspirin  and recent COVID diagnosis. After the patient went through her treatment of COVID the diarrhea worsened and became more explosive. Patient had to discontinue taking the baby Asprin while on paxlovid  and has not resumed taking it. The patient at this time is experiencing no accompanied symptoms outside of it. Patient states she normally has a 10 second warning before she will have about a cup of liquid release from her. Patient states the matter is keeping her confined to her home due to this occurring in a very unpredictable manner.  The patient is a new patient and has not yet seen any provider at [facility]. Please assist patient with Triage and scheduling with the next available provider. (Please do not schedule the patient with NP Man or Dr. Stana.)  Callback Number: [phone]   Reason for Disposition  [1] MODERATE diarrhea (e.g., 4-6 times / day more than normal) AND [2] age > 70 years  Answer Assessment - Initial Assessment Questions 1. DIARRHEA SEVERITY: How bad is the diarrhea? How many more stools have you had in the past 24 hours than normal?      3 2. ONSET: When did the diarrhea begin?      Diarrhea started following a MI and covid.  States was put on a statin and it did not agree with her.  3. STOOL DESCRIPTION:  How loose or watery is the diarrhea? What is the stool color? Is there any blood or mucous in the stool?     Watery, mucous in  stool 4. VOMITING: Are you also vomiting? If Yes, ask: How many times in the past 24 hours?      no 5. ABDOMEN PAIN: Are you having any abdomen pain? If Yes, ask: What does it feel like? (e.g., crampy, dull, intermittent, constant)      Discomfort in LLQ 6. ABDOMEN PAIN SEVERITY: If present, ask: How bad is the pain?  (e.g., Scale 1-10; mild, moderate, or severe)     mild 7. ORAL INTAKE: If vomiting, Have you been able to drink liquids? How much liquids have you had in the past 24 hours?      8. HYDRATION: Any signs of dehydration? (e.g., dry mouth [not just dry lips], too weak to stand, dizziness, new weight loss) When did you last urinate?     denies 9. EXPOSURE: Have you traveled to a foreign country recently? Have you been exposed to anyone with diarrhea? Could you have eaten any food that was spoiled?     denies 10. ANTIBIOTIC USE: Are you taking antibiotics now or have you taken antibiotics in the past 2 months?       At the end of July and was put on covid medication 11. OTHER SYMPTOMS: Do you have any other symptoms? (e.g., fever, blood in stool)       no 12. PREGNANCY: Is there any chance you are pregnant? When was your last menstrual period?       na  Protocols used: [facility]

## 2024-08-24 LAB — GENECONNECT MOLECULAR SCREEN: Genetic Analysis Overall Interpretation: NEGATIVE

## 2024-08-28 ENCOUNTER — Ambulatory Visit: Admitting: Family

## 2024-09-09 ENCOUNTER — Encounter: Payer: Self-pay | Admitting: Family

## 2024-09-09 ENCOUNTER — Ambulatory Visit: Admitting: Family

## 2024-09-09 VITALS — BP 136/78 | HR 83 | Temp 97.8°F | Resp 18 | Ht 61.0 in | Wt 128.4 lb

## 2024-09-09 DIAGNOSIS — Z7689 Persons encountering health services in other specified circumstances: Secondary | ICD-10-CM | POA: Diagnosis not present

## 2024-09-09 DIAGNOSIS — I10 Essential (primary) hypertension: Secondary | ICD-10-CM

## 2024-09-09 DIAGNOSIS — E782 Mixed hyperlipidemia: Secondary | ICD-10-CM

## 2024-09-09 DIAGNOSIS — I25810 Atherosclerosis of coronary artery bypass graft(s) without angina pectoris: Secondary | ICD-10-CM | POA: Diagnosis not present

## 2024-09-09 DIAGNOSIS — R197 Diarrhea, unspecified: Secondary | ICD-10-CM

## 2024-09-09 DIAGNOSIS — E2839 Other primary ovarian failure: Secondary | ICD-10-CM

## 2024-09-10 ENCOUNTER — Other Ambulatory Visit

## 2024-09-11 LAB — CBC WITH DIFFERENTIAL/PLATELET
Absolute Lymphocytes: 1481 {cells}/uL (ref 850–3900)
Absolute Monocytes: 443 {cells}/uL (ref 200–950)
Basophils Absolute: 12 {cells}/uL (ref 0–200)
Basophils Relative: 0.2 %
Eosinophils Absolute: 71 {cells}/uL (ref 15–500)
Eosinophils Relative: 1.2 %
HCT: 39.1 % (ref 35.0–45.0)
Hemoglobin: 12.6 g/dL (ref 11.7–15.5)
MCH: 27.3 pg (ref 27.0–33.0)
MCHC: 32.2 g/dL (ref 32.0–36.0)
MCV: 84.8 fL (ref 80.0–100.0)
MPV: 8.5 fL (ref 7.5–12.5)
Monocytes Relative: 7.5 %
Neutro Abs: 3894 {cells}/uL (ref 1500–7800)
Neutrophils Relative %: 66 %
Platelets: 308 Thousand/uL (ref 140–400)
RBC: 4.61 Million/uL (ref 3.80–5.10)
RDW: 12.4 % (ref 11.0–15.0)
Total Lymphocyte: 25.1 %
WBC: 5.9 Thousand/uL (ref 3.8–10.8)

## 2024-09-11 LAB — COMPLETE METABOLIC PANEL WITHOUT GFR
AG Ratio: 1.1 (calc) (ref 1.0–2.5)
ALT: 11 U/L (ref 6–29)
AST: 16 U/L (ref 10–35)
Albumin: 3.8 g/dL (ref 3.6–5.1)
Alkaline phosphatase (APISO): 60 U/L (ref 37–153)
BUN: 14 mg/dL (ref 7–25)
CO2: 30 mmol/L (ref 20–32)
Calcium: 9 mg/dL (ref 8.6–10.4)
Chloride: 104 mmol/L (ref 98–110)
Creat: 0.71 mg/dL (ref 0.60–0.95)
Globulin: 3.4 g/dL (ref 1.9–3.7)
Glucose, Bld: 83 mg/dL (ref 65–99)
Potassium: 4.2 mmol/L (ref 3.5–5.3)
Sodium: 142 mmol/L (ref 135–146)
Total Bilirubin: 0.5 mg/dL (ref 0.2–1.2)
Total Protein: 7.2 g/dL (ref 6.1–8.1)

## 2024-09-11 LAB — TSH: TSH: 3.37 m[IU]/L (ref 0.40–4.50)

## 2024-09-12 ENCOUNTER — Ambulatory Visit: Payer: Self-pay | Admitting: Family

## 2024-09-12 LAB — SALMONELLA/SHIGELLA CULT, CAMPY EIA AND SHIGA TOXIN RFL ECOLI
MICRO NUMBER: 17104171
MICRO NUMBER:: 17104172
MICRO NUMBER:: 17104173
Result:: NOT DETECTED
SHIGA RESULT:: NOT DETECTED
SPECIMEN QUALITY: ADEQUATE
SPECIMEN QUALITY:: ADEQUATE
SPECIMEN QUALITY:: ADEQUATE

## 2024-09-19 NOTE — Progress Notes (Signed)
 Provider: Roxan Plough FNP-C   Chinonso Linker, Roxan BROCKS, NP  Patient Care Team: Sherline Eberwein, Roxan BROCKS, NP as PCP - General (Family Medicine) Verlin Lonni BIRCH, MD as PCP - Cardiology (Cardiology)  Extended Emergency Contact Information Primary Emergency Contact: Puchyr,Ann Address: [address]          [place], [place] [address] [place] Home Phone: [phone] Mobile Phone: [phone] Relation: Daughter Secondary Emergency Contact: Honea,Sr.,William D. Address: [address]          [place], [place] [address] [place] Home Phone: [phone] Work Phone: [phone] Mobile Phone: [phone] Relation: Son  Code Status: Full code Goals of care: Advanced Directive information    09/09/2024    2:00 PM  Advanced Directives  Does Patient Have a Medical Advance Directive? Yes  Type of Estate agent of [place];Living will  Does patient want to make changes to medical advance directive? No - Patient declined  Copy of Healthcare Power of Attorney in Chart? No - copy requested     Chief Complaint  Patient presents with   Establish Care    New patient appointment.    Discussed the use of AI scribe software for clinical note transcription with the patient, who gave verbal consent to proceed.  History of Present Illness   Dana Palmer is an 83 year old female with coronary artery disease and high cholesterol who presents with chronic diarrhea.  She experiences chronic diarrhea that is life-altering, preventing her from attending social events due to the need for immediate access to a bathroom. Episodes are characterized by explosive diarrhea and occasional narrow, formed stools. There is no blood in the stool, but mucus is present. She reports no fever or abdominal pain. No stool culture has been performed in the past five to ten years.  Her medical history includes coronary artery disease, with a heart attack  in 2016 followed by a coronary artery bypass graft with two grafts. During the surgery, she experienced a second heart attack and phrenic nerve injury, affecting lung inflation. She is managed with Praluent  injections for high cholesterol and amlodipine  5 mg daily for hypertension. Aspirin  is avoided as it exacerbates her diarrhea.  She has a history of bilateral wrist fractures from a fall on ice skates and carpal tunnel surgery. She has multiple medication allergies, including statins, which caused dark urine, and various antibiotics and sweeteners that induce diarrhea.  Family history is significant for her mother having died of pancreatic and liver cancer and her paternal grandmother of stomach cancer.  She leads an active lifestyle, enjoys gardening, and has four children. She uses albuterol  as needed for allergies related to gardening but has not needed it since May. She takes a multivitamin PRN, especially on busy days when her diet may be insufficient.   Past Medical History:  Diagnosis Date    Remote Anterior Stemi 05/22/2015   CAD (coronary artery disease)    a. ant-lat STEMI 6/16 >> LHC:  ostial to mid LAD 99% with complex thrombus, mid to dist LAD 99%, D2 80%, prox RCA 50%, mid RCA 50%, EF 35% with ant-apical AK >>  b. s/p CABG (L-LAD, S-Dx)   Chronic diastolic heart failure (HCC) 02/12/2017   Elevated LFTs 08/26/2015   Essential hypertension    Gallstone    HLD (hyperlipidemia)    Hyperlipidemia 06/07/2015   Ischemic cardiomyopathy    a. echo 6/16:  EF 35%, mild LVH, periapical HK, Gr 2 DD;  b.  Echo  7/16:  Mild LVH, mild focal basal septal hypertrophy, EF 60-65%, no RWMA, trivial AI, MAC, mild increased PASP, trivial effusion   Osteoporosis    Pneumonia    Psoriasis    UTI (lower urinary tract infection)    Past Surgical History:  Procedure Laterality Date   CARDIAC CATHETERIZATION N/A 05/22/2015   Procedure: Left Heart Cath and Coronary Angiography;  Surgeon: Victory LELON Sharps, MD;   Location: [facility] INVASIVE CV LAB;  Service: Cardiovascular;  Laterality: N/A;   CARPAL TUNNEL RELEASE     CORONARY ARTERY BYPASS GRAFT     CORONARY ARTERY BYPASS GRAFT N/A 05/22/2015   Procedure: CORONARY ARTERY BYPASS GRAFTING (CABG);  Surgeon: Elspeth JAYSON Millers, MD;  Location: [facility];  Service: Open Heart Surgery;  Laterality: N/A;   TEE WITHOUT CARDIOVERSION  05/22/2015   Procedure: TRANSESOPHAGEAL ECHOCARDIOGRAM (TEE);  Surgeon: Elspeth JAYSON Millers, MD;  Location: [facility] OR;  Service: Open Heart Surgery;;   TONSILLECTOMY     TUBAL LIGATION  1971   WRIST FRACTURE SURGERY Bilateral 11/26/2008   Open reduction    Allergies  Allergen Reactions   Avelox [Moxifloxacin] Shortness Of Breath and Other (See Comments)    Irregular heart rate and mucoid stools also   Humira [Adalimumab] Other (See Comments)    Stopped working/ possible cardiac reaction   Lipitor [Atorvastatin ] Other (See Comments)    Turned urine brown - abnormal labs   Ultram  [Tramadol  Hcl] Nausea Only   Codeine Nausea And Vomiting and Rash   Methotrexate And Trimetrexate Nausea And Vomiting   Sulfa Antibiotics Nausea Only and Rash   Statins Other (See Comments)    Urine turn very dark brown and her labs came back alarming (per the patient)   Monosodium Glutamate Other (See Comments)    headache   Sorbitol Diarrhea    Allergies as of 09/09/2024       Reactions   Avelox [moxifloxacin] Shortness Of Breath, Other (See Comments)   Irregular heart rate and mucoid stools also   Humira [adalimumab] Other (See Comments)   Stopped working/ possible cardiac reaction   Lipitor [atorvastatin ] Other (See Comments)   Turned urine brown - abnormal labs   Ultram  [tramadol  Hcl] Nausea Only   Codeine Nausea And Vomiting, Rash   Methotrexate And Trimetrexate Nausea And Vomiting   Sulfa Antibiotics Nausea Only, Rash   Statins Other (See Comments)   Urine turn very dark brown and her labs came back alarming (per the patient)    Monosodium Glutamate Other (See Comments)   headache   Sorbitol Diarrhea        Medication List        Accurate as of September 09, 2024 11:59 PM. If you have any questions, ask your nurse or doctor.          STOP taking these medications    aspirin  EC 81 MG tablet Stopped by: Keiri Solano C Marigene Erler   benzonatate  100 MG capsule Commonly known as: TESSALON  Stopped by: Teneka Malmberg C Daielle Melcher   nirmatrelvir /ritonavir  20 x 150 MG & 10 x 100MG  Tabs Commonly known as: PAXLOVID  Stopped by: Roxan JAYSON Jazmynn Pho   ondansetron  4 MG disintegrating tablet Commonly known as: ZOFRAN -ODT Stopped by: Liron Eissler C Lazarius Rivkin       TAKE these medications    albuterol  108 (90 Base) MCG/ACT inhaler Commonly known as: VENTOLIN  HFA Inhale 2 puffs into the lungs as needed for wheezing or shortness of breath.   amLODipine  5 MG tablet Commonly known as: NORVASC  TAKE 1  TABLET BY MOUTH EVERY DAY   Centrum Silver 50+Women Tabs Take by mouth daily.   Praluent  75 MG/ML Soaj Generic drug: Alirocumab  INJECT INTO THE SKIN EVERY 14 DAYS        Review of Systems  Constitutional:  Negative for appetite change, chills, fatigue, fever and unexpected weight change.  HENT:  Negative for congestion, dental problem, ear discharge, ear pain, facial swelling, hearing loss, nosebleeds, postnasal drip, rhinorrhea, sinus pressure, sinus pain, sneezing, sore throat, tinnitus and trouble swallowing.   Eyes:  Negative for pain, discharge, redness, itching and visual disturbance.  Respiratory:  Negative for cough, chest tightness, shortness of breath and wheezing.   Cardiovascular:  Negative for chest pain, palpitations and leg swelling.       Intermittent leg swelling  Gastrointestinal:  Positive for diarrhea. Negative for abdominal distention, abdominal pain, blood in stool, constipation, nausea and vomiting.       Chronic diarrhea  Endocrine: Negative for cold intolerance, heat intolerance, polydipsia, polyphagia and  polyuria.  Genitourinary:  Negative for difficulty urinating, dysuria, flank pain, frequency and urgency.       Urine hesitancy  Musculoskeletal:  Negative for arthralgias, back pain, gait problem, joint swelling, myalgias, neck pain and neck stiffness.  Skin:  Negative for color change, pallor, rash and wound.  Neurological:  Negative for dizziness, syncope, speech difficulty, weakness, light-headedness, numbness and headaches.  Hematological:  Does not bruise/bleed easily.  Psychiatric/Behavioral:  Negative for agitation, behavioral problems, confusion, hallucinations, self-injury, sleep disturbance and suicidal ideas. The patient is not nervous/anxious.     Immunization History  Administered Date(s) Administered   PFIZER(Purple Top)SARS-COV-2 Vaccination 12/12/2019, 01/02/2020, 11/07/2020   PPD Test 01/26/2012, 12/30/2013   Pfizer(Comirnaty)Fall Seasonal Vaccine 12 years and older 11/04/2022   Pneumococcal Polysaccharide-23 11/27/2005   Tdap 11/28/2007   Unspecified SARS-COV-2 Vaccination 11/08/2023   Pertinent  Health Maintenance Due  Topic Date Due   DEXA SCAN  Never done   Influenza Vaccine  Never done      06/05/2020    2:59 PM 09/09/2024    1:59 PM  Fall Risk  Falls in the past year?  0  Was there an injury with Fall?  0  Fall Risk Category Calculator  0  (RETIRED) Patient Fall Risk Level Low fall risk    Patient at Risk for Falls Due to  No Fall Risks  Fall risk Follow up  Falls evaluation completed     Data saved with a previous flowsheet row definition   Functional Status Survey:    Vitals:   09/09/24 1407  BP: 136/78  Pulse: 83  Resp: 18  Temp: 97.8 F (36.6 C)  SpO2: 97%  Weight: 128 lb 6.4 oz (58.2 kg)  Height: 5' 1 (1.549 m)   Body mass index is 24.26 kg/m. Physical Exam GENERAL: Alert, cooperative, well developed, no acute distress HEENT: Normocephalic, normal oropharynx, moist mucous membranes, nose normal, no sinus tenderness NECK: Thyroid  normal CHEST: Clear to auscultation bilaterally, no wheezes, rhonchi, or crackles CARDIOVASCULAR: Normal heart rate and rhythm, S1 and S2 normal without murmurs ABDOMEN: Soft, non-tender, non-distended, without organomegaly, normal bowel sounds EXTREMITIES: No cyanosis, intermittent leg swelling reported but none this visit  MUSCULOSKELETAL: Hips and shoulders normal range of motion NEUROLOGICAL: Cranial nerves grossly intact, moves all extremities without gross motor or sensory deficit  SKIN: No rash,no lesion or erythema   PSYCHIATRY/BEHAVIORAL: Mood stable    Labs reviewed: Recent Labs    09/10/24 0916  NA 142  K 4.2  CL 104  CO2 30  GLUCOSE 83  BUN 14  CREATININE 0.71  CALCIUM  9.0   Recent Labs    02/07/24 1032 09/10/24 0916  AST  --  16  ALT 11 11  BILITOT  --  0.5  PROT  --  7.2   Recent Labs    09/10/24 0916  WBC 5.9  NEUTROABS 3,894  HGB 12.6  HCT 39.1  MCV 84.8  PLT 308   Lab Results  Component Value Date   TSH 3.37 09/10/2024   Lab Results  Component Value Date   HGBA1C 5.7 (H) 05/22/2015   Lab Results  Component Value Date   CHOL 168 02/07/2024   HDL 78 02/07/2024   LDLCALC 76 02/07/2024   LDLDIRECT 124 (H) 03/19/2017   TRIG 75 02/07/2024   CHOLHDL 2.2 02/07/2024    Significant Diagnostic Results in last 30 days:  No results found.  Assessment/Plan    Chronic diarrhea Chronic diarrhea with life-altering symptoms, including explosive diarrhea and mucus in stools. No blood or fever reported. Occasional narrow stools. Concerned about potential serious underlying conditions but not inclined to pursue aggressive treatment if diagnosed with advanced disease. - Refer to gastroenterologist for further evaluation. - Provide stool specimen for culture. - Avoid sweeteners that exacerbate diarrhea.  Coronary artery disease, status post CABG and myocardial infarction Coronary artery disease with CABG and myocardial infarction in 2016. No current  cardiac symptoms reported.  Hypertension Hypertension managed with amlodipine  5 mg daily. - Continue to monitor blood pressure  - Dietary modification and exercise as tolerated  Hyperlipidemia Hyperlipidemia managed with Praluent  injections. Statins are contraindicated due to severe side effects. Cholesterol levels monitored.  Lower extremity edema (intermittent) Intermittent lower extremity edema, possibly related to salt intake. - No edema noted this visit  - Monitor salt intake.  Urinary hesitancy Intermittent urinary hesitancy with occasional slow flow. No burning or pain reported. - Increase water intake to prevent urinary tract infections.  General Health Maintenance Due for several vaccinations and screenings. No recent flu shot due to past adverse reactions. COVID vaccine tolerated well. Uncertain about past shingles vaccination. Due for bone density screening. - Check immunization records for shingles vaccine history. - Order bone density screening at [facility] location. - Schedule fasting blood work for CBC, CMP and thyroid function.    Family/ staff Communication: Reviewed plan of care with patient verbalized understanding  Labs/tests ordered:  - CBC with Differential/Platelet - CMP with eGFR([facility]) - TSH - Bone density  - Salmonella stool culture   Next Appointment : Return in about 6 months (around 03/10/2025) for medical mangement of chronic issues.Fasting labs soon .   Spent 30 minutes of Face to face and non-face to face with patient  >50% time spent counseling; reviewing medical record; tests; labs; documentation and developing future plan of care.   Roxan JAYSON Plough, NP

## 2024-09-26 ENCOUNTER — Encounter: Payer: Self-pay | Admitting: Gastroenterology

## 2024-11-13 ENCOUNTER — Other Ambulatory Visit

## 2024-11-13 ENCOUNTER — Ambulatory Visit: Admitting: Gastroenterology

## 2024-11-13 ENCOUNTER — Ambulatory Visit: Payer: Self-pay | Admitting: Gastroenterology

## 2024-11-13 ENCOUNTER — Encounter: Payer: Self-pay | Admitting: Gastroenterology

## 2024-11-13 ENCOUNTER — Ambulatory Visit: Admitting: Physician Assistant

## 2024-11-13 VITALS — BP 122/70 | HR 87 | Ht 61.0 in | Wt 128.0 lb

## 2024-11-13 DIAGNOSIS — R194 Change in bowel habit: Secondary | ICD-10-CM

## 2024-11-13 DIAGNOSIS — I10 Essential (primary) hypertension: Secondary | ICD-10-CM

## 2024-11-13 DIAGNOSIS — Z800 Family history of malignant neoplasm of digestive organs: Secondary | ICD-10-CM | POA: Diagnosis not present

## 2024-11-13 DIAGNOSIS — R197 Diarrhea, unspecified: Secondary | ICD-10-CM | POA: Diagnosis not present

## 2024-11-13 DIAGNOSIS — R159 Full incontinence of feces: Secondary | ICD-10-CM

## 2024-11-13 LAB — BASIC METABOLIC PANEL WITH GFR
BUN: 15 mg/dL (ref 6–23)
CO2: 32 meq/L (ref 19–32)
Calcium: 9.2 mg/dL (ref 8.4–10.5)
Chloride: 104 meq/L (ref 96–112)
Creatinine, Ser: 0.73 mg/dL (ref 0.40–1.20)
GFR: 76.44 mL/min (ref >60.00)
Glucose, Bld: 101 mg/dL — ABNORMAL HIGH (ref 70–99)
Potassium: 4.1 meq/L (ref 3.5–5.1)
Sodium: 144 meq/L (ref 135–145)

## 2024-11-13 NOTE — Progress Notes (Signed)
 Chief Complaint: Diarrhea Primary GI MD: Dr. Abran  HPI: Discussed the use of AI scribe software for clinical note transcription with the patient, who gave verbal consent to proceed.  History of Present Illness  Dana Palmer is an 83 year old female who presents with worsening diarrhea and incontinence.  She has a long-standing history of intermittent diarrhea, which worsened after a myocardial infarction in 2016. Symptoms intensified after starting baby aspirin  (81 mg daily), leading to discontinuation six weeks ago, resulting in some improvement. Despite this, she continues to experience severe episodes, necessitating the use of sanitary napkins daily. The most recent severe episode occurred at the end of summer following a COVID-19 infection and treatment with Paxlovid .  Her stools are described as 'wet sawdust' in consistency, with no solid stools between episodes. Since stopping aspirin , stools have gradually become more solid, though she still experiences frequent morning bowel movements, ranging from four to six times, down from a previous high of twelve. She has eliminated oatmeal from her diet, which she was allergic to as a baby, and switched to cream of wheat, which has helped improve her symptoms.  The condition has significantly impacted her life, causing social embarrassment and requiring pads at night due to nocturnal incontinence. No solid stools are noted in the morning, with more solid stools occurring later in the day. She has not had a colonoscopy in over ten years and has no family history of colon cancer, although her mother had pancreatic and liver cancer, and her paternal grandmother died of 'stomach cancer.'  She has tried fiber supplements like Metamucil, which exacerbate her symptoms, and has not yet tried [employer]. Chocolate has been eliminated from her diet, previously consumed regularly without issue. No issues with dairy are noted, as she consumes cream of wheat  with two percent milk without problems.  She is a retired facilities manager  Past Medical History:  Diagnosis Date    Remote Anterior Stemi 05/22/2015   CAD (coronary artery disease)    a. ant-lat STEMI 6/16 >> LHC:  ostial to mid LAD 99% with complex thrombus, mid to dist LAD 99%, D2 80%, prox RCA 50%, mid RCA 50%, EF 35% with ant-apical AK >>  b. s/p CABG (L-LAD, S-Dx)   Chronic diastolic heart failure (HCC) 02/12/2017   Elevated LFTs 08/26/2015   Essential hypertension    Gallstone    HLD (hyperlipidemia)    Hyperlipidemia 06/07/2015   Ischemic cardiomyopathy    a. echo 6/16:  EF 35%, mild LVH, periapical HK, Gr 2 DD;  b.  Echo 7/16:  Mild LVH, mild focal basal septal hypertrophy, EF 60-65%, no RWMA, trivial AI, MAC, mild increased PASP, trivial effusion   Osteoporosis    Pneumonia    Psoriasis    UTI (lower urinary tract infection)     Past Surgical History:  Procedure Laterality Date   CARDIAC CATHETERIZATION N/A 05/22/2015   Procedure: Left Heart Cath and Coronary Angiography;  Surgeon: Victory LELON Sharps, MD;  Location: [facility] INVASIVE CV LAB;  Service: Cardiovascular;  Laterality: N/A;   CARPAL TUNNEL RELEASE     CORONARY ARTERY BYPASS GRAFT     CORONARY ARTERY BYPASS GRAFT N/A 05/22/2015   Procedure: CORONARY ARTERY BYPASS GRAFTING (CABG);  Surgeon: Elspeth JAYSON Millers, MD;  Location: [facility] OR;  Service: Open Heart Surgery;  Laterality: N/A;   TEE WITHOUT CARDIOVERSION  05/22/2015   Procedure: TRANSESOPHAGEAL ECHOCARDIOGRAM (TEE);  Surgeon: Elspeth JAYSON Millers, MD;  Location: [facility] OR;  Service: Open Heart Surgery;;  TONSILLECTOMY     TUBAL LIGATION  1971   WRIST FRACTURE SURGERY Bilateral 11/26/2008   Open reduction    Current Outpatient Medications  Medication Sig Dispense Refill   Alirocumab  (PRALUENT ) 75 MG/ML SOAJ INJECT INTO THE SKIN EVERY 14 DAYS 6 mL 3   amLODipine  (NORVASC ) 5 MG tablet TAKE 1 TABLET BY MOUTH EVERY DAY 90 tablet 3   clobetasol  (OLUX ) 0.05 % topical foam  Apply topically 2 (two) times daily.     albuterol  (VENTOLIN  HFA) 108 (90 Base) MCG/ACT inhaler Inhale 2 puffs into the lungs as needed for wheezing or shortness of breath. (Patient not taking: Reported on 11/13/2024)     Multiple Vitamins-Minerals (CENTRUM SILVER 50+WOMEN) TABS Take by mouth daily. (Patient not taking: Reported on 11/13/2024)     No current facility-administered medications for this visit.    Allergies as of 11/13/2024 - Review Complete 11/13/2024  Allergen Reaction Noted   Avelox [moxifloxacin] Shortness Of Breath and Other (See Comments) 08/24/2012   Humira [adalimumab] Other (See Comments) 04/24/2016   Lipitor [atorvastatin ] Other (See Comments) 08/12/2015   Ultram  [tramadol  hcl] Nausea Only 08/31/2016   Codeine Nausea And Vomiting and Rash 08/24/2012   Methotrexate and trimetrexate Nausea And Vomiting 05/23/2015   Sulfa antibiotics Nausea Only and Rash 08/24/2012   Statins Other (See Comments) 03/27/2017   Monosodium glutamate Other (See Comments) 04/24/2016   Sorbitol Diarrhea 04/24/2016    Family History  Problem Relation Age of Onset   Heart disease Father    Urolithiasis Father    Pancreatic disease Mother    Heart failure Mother    Liver cancer Mother    Cancer Paternal Grandmother    Heart attack Paternal Grandfather    Heart block Sister    Rheum arthritis Sister    Hypertension Sister    Stroke Sister    Prostate cancer Son    Arthritis Son        Psoriatic    Social History   Socioeconomic History   Marital status: Widowed    Spouse name: Not on file   Number of children: 3   Years of education: Assoc   Highest education level: Not on file  Occupational History   Occupation: retired engineer, civil (consulting)  Tobacco Use   Smoking status: Former    Current packs/day: 0.00    Average packs/day: 0.5 packs/day for 50.0 years (25.0 ttl pk-yrs)    Types: Cigarettes    Start date: 05/22/1951    Quit date: 05/21/2001    Years since quitting: 23.4   Smokeless  tobacco: Never  Vaping Use   Vaping status: Never Used  Substance and Sexual Activity   Alcohol use: No    Alcohol/week: 0.0 standard drinks of alcohol    Comment: 4x/yr   Drug use: No   Sexual activity: Not on file  Other Topics Concern   Not on file  Social History Narrative   Lives with her daughter, takes care of her own ADLs and iADLs   Right-handed   Caffeine: 3 cups per day   Social Drivers of Health   Tobacco Use: Medium Risk (11/13/2024)   Patient History    Smoking Tobacco Use: Former    Smokeless Tobacco Use: Never    Passive Exposure: Not on Actuary Strain: Not on file  Food Insecurity: Not on file  Transportation Needs: Not on file  Physical Activity: Not on file  Stress: Not on file  Social Connections: Not on file  Intimate Partner Violence: Not on file  Depression (PHQ2-9): Low Risk (09/09/2024)   Depression (PHQ2-9)    PHQ-2 Score: 0  Alcohol Screen: Not on file  Housing: Not on file  Utilities: Not on file  Health Literacy: Not on file    Review of Systems:    Constitutional: No weight loss, fever, chills, weakness or fatigue HEENT: Eyes: No change in vision               Ears, Nose, Throat:  No change in hearing or congestion Skin: No rash or itching Cardiovascular: No chest pain, chest pressure or palpitations   Respiratory: No SOB or cough Gastrointestinal: See HPI and otherwise negative Genitourinary: No dysuria or change in urinary frequency Neurological: No headache, dizziness or syncope Musculoskeletal: No new muscle or joint pain Hematologic: No bleeding or bruising Psychiatric: No history of depression or anxiety    Physical Exam:  Vital signs: BP 122/70   Pulse 87   Ht 5' 1 (1.549 m)   Wt 128 lb (58.1 kg)   BMI 24.19 kg/m   Constitutional: NAD, alert and cooperative Head:  Normocephalic and atraumatic. Eyes:   PEERL, EOMI. No icterus. Conjunctiva pink. Respiratory: Respirations even and unlabored. Lungs  clear to auscultation bilaterally.   No wheezes, crackles, or rhonchi.  Cardiovascular:  Regular rate and rhythm. No peripheral edema, cyanosis or pallor.  Gastrointestinal:  Soft, nondistended, nontender. No rebound or guarding. Normal bowel sounds. No appreciable masses or hepatomegaly. Rectal:  Declines Msk:  Symmetrical without gross deformities. Without edema, no deformity or joint abnormality.  Neurologic:  Alert and  oriented x4;  grossly normal neurologically.  Skin:   Dry and intact without significant lesions or rashes. Psychiatric: Oriented to person, place and time. Demonstrates good judgement and reason without abnormal affect or behaviors.  Physical Exam    RELEVANT LABS AND IMAGING: CBC    Component Value Date/Time   WBC 5.9 09/10/2024 0916   RBC 4.61 09/10/2024 0916   HGB 12.6 09/10/2024 0916   HGB 12.3 05/10/2020 0800   HCT 39.1 09/10/2024 0916   HCT 38.7 05/10/2020 0800   PLT 308 09/10/2024 0916   PLT 285 05/10/2020 0800   MCV 84.8 09/10/2024 0916   MCV 83 05/10/2020 0800   MCH 27.3 09/10/2024 0916   MCHC 32.2 09/10/2024 0916   RDW 12.4 09/10/2024 0916   RDW 12.6 05/10/2020 0800   LYMPHSABS 2.0 02/01/2023 1925   MONOABS 0.6 02/01/2023 1925   EOSABS 71 09/10/2024 0916   BASOSABS 12 09/10/2024 0916    CMP     Component Value Date/Time   NA 142 09/10/2024 0916   NA 143 10/12/2021 1003   K 4.2 09/10/2024 0916   CL 104 09/10/2024 0916   CO2 30 09/10/2024 0916   GLUCOSE 83 09/10/2024 0916   BUN 14 09/10/2024 0916   BUN 15 10/12/2021 1003   CREATININE 0.71 09/10/2024 0916   CALCIUM  9.0 09/10/2024 0916   PROT 7.2 09/10/2024 0916   PROT 6.9 11/13/2022 1100   ALBUMIN  4.1 11/13/2022 1100   AST 16 09/10/2024 0916   ALT 11 09/10/2024 0916   ALKPHOS 71 11/13/2022 1100   BILITOT 0.5 09/10/2024 0916   BILITOT 0.2 11/13/2022 1100   GFRNONAA >60 02/01/2023 1925   GFRNONAA 74 12/02/2015 1443   GFRAA 84 05/17/2020 0845   GFRAA 86 12/02/2015 1443      Assessment/Plan:   Diarrhea Fecal incontinence Normal CBC, CMP, TSH.  Infectious studies.. Ongoing intermittently since  2016 after being put on ASA for CAD. Colonoscopy greater than 10 years ago. Worsened after COVID and subsequent Paxlovid  treatment late summer 2025.   Some improvement since discontinuing aspirin .  4-6 loose BMs per day with some fecal incontinence and occasional explosive episodes.  This is very bothersome for her and concerns her as she has a family history of liver cancer, pancreatic cancer, stomach cancer.  She appears significantly younger than stated age and could be an adequate candidate for colonoscopy. Declines rectal exam. History of 3 childbirths. 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

## 2024-11-13 NOTE — Patient Instructions (Addendum)
 A high fiber diet with plenty of fluids (up to 8 glasses of water daily) is suggested to relieve these symptoms.  Benefiber, 1 tablespoon once or twice daily can be used to keep bowels regular if needed.  Align probiotic   _______________________________________________________  If your blood pressure at your visit was 140/90 or greater, please contact your primary care physician to follow up on this.  _______________________________________________________  If you are age 82 or older, your body mass index should be between 23-30. Your Body mass index is 24.19 kg/m. If this is out of the aforementioned range listed, please consider follow up with your Primary Care Provider.  If you are age 82 or younger, your body mass index should be between 19-25. Your Body mass index is 24.19 kg/m. If this is out of the aformentioned range listed, please consider follow up with your Primary Care Provider.   ________________________________________________________  The [facility] providers would like to encourage you to use MYCHART to communicate with providers for non-urgent requests or questions.  Due to long hold times on the telephone, sending your provider a message by [facility] may be a faster and more efficient way to get a response.  Please allow 48 business hours for a response.  Please remember that this is for non-urgent requests.  _______________________________________________________  [facility] is using a team-based approach to care.  Your team is made up of your doctor and two to three APPS. Our APPS (Nurse Practitioners and Physician Assistants) work with your physician to ensure care continuity for you. They are fully qualified to address your health concerns and develop a treatment plan. They communicate directly with your gastroenterologist to care for you. Seeing the Advanced Practice Practitioners on your physician's team can help you by facilitating care more promptly, often  allowing for earlier appointments, access to diagnostic testing, procedures, and other specialty referrals.   Your provider has requested that you go to the basement level for lab work before leaving today. Press B on the elevator. The lab is located at the first door on the left as you exit the elevator.  We have referred you to Pelvic Floor Therapy.  Someone should contact you with an appointment.  If you have not heard from their office in 1 to 2 weeks, please let our office know.   You have been scheduled for a CT scan of the abdomen and pelvis at [facility], 1st floor Radiology. You are scheduled on 11-19-24 at 2pm. You should arrive at 12pm prior to your appointment time for registration and to drink contrast.     You may take any medications as prescribed with a small amount of water, if necessary. If you take any of the following medications: METFORMIN, GLUCOPHAGE, GLUCOVANCE, AVANDAMET, RIOMET, FORTAMET, ACTOPLUS MET, JANUMET, GLUMETZA or METAGLIP, you MAY be asked to HOLD this medication 48 hours AFTER the exam.   The purpose of you drinking the oral contrast is to aid in the visualization of your intestinal tract. The contrast solution may cause some diarrhea. Depending on your individual set of symptoms, you may also receive an intravenous injection of x-ray contrast/dye. Plan on being at [facility] for 45 minutes or longer, depending on the type of exam you are having performed.   If you have any questions regarding your exam or if you need to reschedule, you may call [facility] at [phone] between the hours of 8:00 am and 5:00 pm, Monday-Friday.   Due to recent changes in  healthcare laws, you may see the results of your imaging and laboratory studies on MyChart before your provider has had a chance to review them.  We understand that in some cases there may be results that are confusing or concerning to you. Not all laboratory results come back in the same time  frame and the provider may be waiting for multiple results in order to interpret others.  Please give us  48 hours in order for your provider to thoroughly review all the results before contacting the office for clarification of your results.

## 2024-11-19 ENCOUNTER — Ambulatory Visit (HOSPITAL_COMMUNITY)

## 2024-11-19 NOTE — Progress Notes (Signed)
 Noted

## 2024-11-24 ENCOUNTER — Telehealth: Payer: Self-pay | Admitting: Pharmacy Technician

## 2024-11-24 NOTE — Telephone Encounter (Signed)
" ° ° °  Pharmacy Patient Advocate Encounter   Received notification from Onbase that prior authorization for PRALUENT  is required/requested.   Insurance verification completed.   The patient is insured through [employer].   Per test claim: PA required; PA submitted to above mentioned insurance via Latent Key/confirmation #/EOC [id number] Status is pending    She has grant still until 03/09/25  "

## 2024-11-24 NOTE — Telephone Encounter (Signed)
 Pharmacy Patient Advocate Encounter  Received notification from [facility] that Prior Authorization for Praluent  has been APPROVED from 11/24/24 to 11/26/25. Spoke to pharmacy to process.Copay is $0.00-she has [employer] grant.    PA #/Case ID/Reference #: [id number]

## 2024-12-01 NOTE — Progress Notes (Signed)
 " OUTPATIENT PHYSICAL THERAPY FEMALE PELVIC EVALUATION   Patient Name: Dana Palmer MRN: [id number] DOB:September 02, 1941, 84 y.o., female Today's Date: 12/02/2024  END OF SESSION:  PT End of Session - 12/02/24 1041     Visit Number 1    Date for Recertification  02/24/25    Authorization Type [employer] medicare    Authorization - Visit Number 1    Authorization - Number of Visits 10    PT Start Time 1030    PT Stop Time 1115    PT Time Calculation (min) 45 min    Activity Tolerance Patient tolerated treatment well    Behavior During Therapy WFL for tasks assessed/performed          Past Medical History:  Diagnosis Date    Remote Anterior Stemi 05/22/2015   CAD (coronary artery disease)    a. ant-lat STEMI 6/16 >> LHC:  ostial to mid LAD 99% with complex thrombus, mid to dist LAD 99%, D2 80%, prox RCA 50%, mid RCA 50%, EF 35% with ant-apical AK >>  b. s/p CABG (L-LAD, S-Dx)   Chronic diastolic heart failure (HCC) 02/12/2017   Elevated LFTs 08/26/2015   Essential hypertension    Gallstone    HLD (hyperlipidemia)    Hyperlipidemia 06/07/2015   Ischemic cardiomyopathy    a. echo 6/16:  EF 35%, mild LVH, periapical HK, Gr 2 DD;  b.  Echo 7/16:  Mild LVH, mild focal basal septal hypertrophy, EF 60-65%, no RWMA, trivial AI, MAC, mild increased PASP, trivial effusion   Osteoporosis    Pneumonia    Psoriasis    UTI (lower urinary tract infection)    Past Surgical History:  Procedure Laterality Date   CARDIAC CATHETERIZATION N/A 05/22/2015   Procedure: Left Heart Cath and Coronary Angiography;  Surgeon: Victory LELON Sharps, MD;  Location: [facility] INVASIVE CV LAB;  Service: Cardiovascular;  Laterality: N/A;   CARPAL TUNNEL RELEASE     CORONARY ARTERY BYPASS GRAFT     CORONARY ARTERY BYPASS GRAFT N/A 05/22/2015   Procedure: CORONARY ARTERY BYPASS GRAFTING (CABG);  Surgeon: Elspeth JAYSON Millers, MD;  Location: [facility] OR;  Service: Open Heart Surgery;  Laterality: N/A;   TEE WITHOUT CARDIOVERSION  05/22/2015    Procedure: TRANSESOPHAGEAL ECHOCARDIOGRAM (TEE);  Surgeon: Elspeth JAYSON Millers, MD;  Location: [facility] OR;  Service: Open Heart Surgery;;   TONSILLECTOMY     TUBAL LIGATION  1971   WRIST FRACTURE SURGERY Bilateral 11/26/2008   Open reduction   Patient Active Problem List   Diagnosis Date Noted   Chronic diastolic heart failure (HCC) 02/12/2017   Elevated LFTs 08/26/2015   Elevated hemidiaphragm 08/24/2015   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 06/07/2015   Essential hypertension 06/07/2015   Hyperlipidemia 06/07/2015    Remote Anterior Stemi 05/22/2015   Psoriasis 05/22/2015    PCP: Leonarda Roxan JAYSON, NP  REFERRING PROVIDER: Mollie Nestor HERO, PA-C   REFERRING DIAG:  R15.9 (ICD-10-CM) - Incontinence of feces, unspecified fecal incontinence type  R19.4 (ICD-10-CM) - Change in bowel habits    THERAPY DIAG:  Muscle weakness (generalized) - Plan: PT plan of care cert/re-cert  Other lack of coordination - Plan: PT plan of care cert/re-cert  Cramp and spasm - Plan: PT plan of care cert/re-cert  Right lower quadrant abdominal pain - Plan: PT plan of care cert/re-cert  Rationale for Evaluation and Treatment: Rehabilitation  ONSET DATE: 04/2015  SUBJECTIVE:  SUBJECTIVE STATEMENT: She has a long-standing history of intermittent diarrhea, which worsened after a myocardial . 4-6 loose BMs per day with some fecal incontinence and occasional explosive episodes infarction in 2016 . Last 2 months has had pain in right lower quadrant. She will have 12 bowel movements in the morning. 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  Fluid intake: Coffee all day sips, water, juices  PERTINENT HISTORY:  Medications for  current condition: none Surgeries: see above Other: Chronic diastolic heart failure; Osteoporosis Sexual abuse: No  PAIN:  Are you having pain? Yes NPRS scale: 7/10 Pain location: right lower quadrant  Pain type: sharp and stabbing Pain description: intermittent   Aggravating factors: comes on randomly, comes after a bout of diarrhea  Relieving factors: laying horizontal and will pass on its own  PRECAUTIONS: None  RED FLAGS: None   WEIGHT BEARING RESTRICTIONS: No  FALLS:  Has patient fallen in last 6 months? No  OCCUPATION: retired engineer, civil (consulting)  ACTIVITY LEVEL : gardener  PLOF: Independent  PATIENT GOALS: strengthen the pelvic floor   BOWEL MOVEMENT: Pain with bowel movement: No Type of bowel movement:Type (Bristol Stool Scale) Type 4, 6, 7, Frequency 12 times in the morning, and Strain no Fully empty rectum: Yes:   Leakage: Yes:                                                    Caused by: change of position Bowel urgency: 3 seconds of warning Pads: Yes: 2 pads per day and night Fiber supplement/laxative Metamucil   URINATION: Pain with urination: No Fully empty bladder: Nonot sure due to small stream at night                                         Post-void dribble: No Stream: Strong and Weak Urgency: No Frequency:during the day every couple of hours                                                        Nocturia: No2 times  4:00 and 5:00 Leakage: none  INTERCOURSE: not active   PREGNANCY: Vaginal deliveries 3 Tearing No C-section deliveries 0  PROLAPSE: None   OBJECTIVE:  Note: Objective measures were completed at Evaluation unless otherwise noted.   PATIENT SURVEYS:  PFIQ-7: 50 CRAIG -7 52  COGNITION: Overall cognitive status: Within functional limits for tasks assessed     SENSATION: Light touch: Appears intact  FUNCTIONAL TESTS:  Single leg stance:  Rt: has to hold onto something  Lt: has to hold onto something  GAIT: Assistive  device utilized: None  POSTURE: decreased lumbar lordosis   LUMBARAROM/PROM: Lumbar ROM decreased by 50%   LOWER EXTREMITY ROM:  Passive ROM Right eval Left eval  Hip external rotation 40 30   (Blank rows = not tested)  LOWER EXTREMITY FFU:apojuzmjo lower extremity strength is 5/5  PALPATION:  Abdominal: decreased movement of the diaphragm  Diastasis: No Distortion: No  Breathing: difficulty with opening the lower rib cage and  filling up her abdomen                External Perineal Exam: intact, skin tag on the anterior portion of the rectum, contract with adding the gluteals                             Internal Pelvic Floor: therapist could only get her index finger past the external anal sphincter due to pain and tightness  Patient confirms identification and approves PT to assess internal pelvic floor and treatment Yes All internal or external pelvic floor assessments and/or treatments are completed with proper hand hygiene and gloves hands. If needed gloves are changed with hand hygiene during patient care time. No emotional/communication barriers or cognitive limitation. Patient is motivated to learn. Patient understands and agrees with treatment goals and plan. PT explains patient will be examined in standing, sitting, and lying down to see how their muscles and joints work. When they are ready, they will be asked to remove their underwear so PT can examine their perineum. The patient is also given the option of providing their own chaperone as one is not provided in our facility. The patient also has the right and is explained the right to defer or refuse any part of the evaluation or treatment including the internal exam. With the patient's consent, PT will use one gloved finger to gently assess the muscles of the pelvic floor, seeing how well it contracts and relaxes and if there is muscle symmetry. After, the patient will get dressed and PT and patient will discuss exam  findings and plan of care. PT and patient discuss plan of care, schedule, attendance policy and HEP activities.   PELVIC MMT:   MMT eval  Vaginal   Internal Anal Sphincter   External Anal Sphincter 1/5  Puborectalis   (Blank rows = not tested)         TODAY'S TREATMENT:                                                                                                                              DATE: 12/02/24  EVAL Examination completed, findings reviewed, pt educated on POC, HEP, and female pelvic floor anatomy, reasoning with pelvic floor assessment internally with pt consent. Pt motivated to participate in PT and agreeable to attempt recommendations.     PATIENT EDUCATION:  12/02/24 Education details: Access Code: [email or web address] Person educated: Patient Education method: Explanation, Demonstration, Tactile cues, Verbal cues, and Handouts Education comprehension: verbalized understanding, returned demonstration, verbal cues required, tactile cues required, and needs further education  HOME EXERCISE PROGRAM: 12/02/24 Access Code: [email or web address] URL: [email or web address] Date: 12/02/2024 Prepared by: Channing Pereyra  Program Notes sit on tennis ball to massage the pelvic floor 1 time per day. sit more on your sitting bones to keep the muscle relaxed.   Exercises - Seated Diaphragmatic Breathing  - 3 x daily - 7  x weekly - 1 sets - 10 reps - Seated Piriformis Stretch with Trunk Bend  - 1 x daily - 7 x weekly - 1 sets - 2 reps - 30 sec hold  ASSESSMENT:  CLINICAL IMPRESSION: Patient is a 84 y.o. female who was seen today for physical therapy evaluation and treatment for fecal incontinence. Patient reports she had issues with her fecal leakage after her heart attack. Her right lower quadrant pain is at level 7/10 after having a bowel movement with diarrhea. She has to lay down to reduce the discomfort. She will have type 4, 6, or 7 daily. She will have up to 12 bowel movements in the  morning. She will restrict going out in public due to the explosive diarrhea and fecal leakage. She will get 3 second warning with bowel urgency. She has to hold onto something with single leg balance. She has difficulty with opening up her lower rib cage. She has tightness in the external anal sphincter with pain preventing the therapist gloved finger form going past the sphincter. She will contract her gluteals to contract the anus. Her external anal sphincter strength was 1/5. Patient has to wear 2 pads due to fecal leakage. Patient will benefit from skilled therapy to reduce her fecal leakage so she is able to be out in public to socialize.    OBJECTIVE IMPAIRMENTS: decreased activity tolerance, decreased coordination, decreased endurance, decreased strength, increased fascial restrictions, increased muscle spasms, and pain.   ACTIVITY LIMITATIONS: sleeping, continence, and toileting  PARTICIPATION LIMITATIONS: meal prep, cleaning, laundry, driving, shopping, community activity, and yard work  PERSONAL FACTORS: 1-2 comorbidities: Chronic diastolic heart failure; Osteoporosis are also affecting patient's functional outcome.   REHAB POTENTIAL: Excellent  CLINICAL DECISION MAKING: Evolving/moderate complexity  EVALUATION COMPLEXITY: Moderate   GOALS: Goals reviewed with patient? Yes  SHORT TERM GOALS: Target date: 12/30/24  Patient able to perform diaphragmatic breathing to relax her pelvic floor for full range of the sphincter muscles.  Baseline: Goal status: INITIAL  2.  Patient understands how to sit on the commode and fully relax her pelvic floor to fully expel firm stool.  Baseline:  Goal status: INITIAL  3.  Patient educated on abdominal massage to assist with stool formation.  Baseline:  Goal status: INITIAL  4.  Patient educated on bowel health with chewing her food well, timed toileting after a meal.  Baseline:  Goal status: INITIAL   LONG TERM GOALS: Target date:  02/24/25  Patient independent with advanced HEP for core and pelvic floor to reduce the amount of fecal leakage.  Baseline:  Goal status: INITIAL  2.  Patient is able to go out of her house 50% more of the time due to the fecal leakage decreased >/= 50%.  Baseline:  Goal status: INITIAL  3.  Rectal strength increased >/= 3/5 so patient is able to avoid stool leakage as she is walking to the bathroom.  Baseline:  Goal status: INITIAL  4.  Patient reports her right lower abdominal pain has decreased </= 2/10 and not as frequent due to improved consistency of her stool to Type 4 more of the time.  Baseline:  Goal status: INITIAL  5.  CRAIG -7 score is l</= 20 compared to 52 Baseline:  Goal status: INITIAL   PLAN:  PT FREQUENCY: 1-2x/week  PT DURATION: 12 weeks  PLANNED INTERVENTIONS: 97110-Therapeutic exercises, 97530- Therapeutic activity, [id number]- Neuromuscular re-education, 97535- Self Care, [id number]- Manual therapy, [id number]- Electrical stimulation (unattended), 20560 (1-2 muscles), 20561 (3+  muscles)- Dry Needling, Patient/Family education, Cryotherapy, Moist heat, and Biofeedback  PLAN FOR NEXT SESSION: diaphragmatic breathing, abdominal massage, rib mobility, hip stretches, manual work around the rectum and anococcygeal ligament   Channing Pereyra, PT 12/02/2024 1:32 PM   "

## 2024-12-02 ENCOUNTER — Other Ambulatory Visit: Payer: Self-pay

## 2024-12-02 ENCOUNTER — Encounter: Payer: Self-pay | Admitting: Physical Therapy

## 2024-12-02 ENCOUNTER — Encounter: Attending: Gastroenterology | Admitting: Physical Therapy

## 2024-12-02 DIAGNOSIS — M6281 Muscle weakness (generalized): Secondary | ICD-10-CM | POA: Diagnosis not present

## 2024-12-02 DIAGNOSIS — R252 Cramp and spasm: Secondary | ICD-10-CM

## 2024-12-02 DIAGNOSIS — R278 Other lack of coordination: Secondary | ICD-10-CM | POA: Insufficient documentation

## 2024-12-02 DIAGNOSIS — R159 Full incontinence of feces: Secondary | ICD-10-CM | POA: Insufficient documentation

## 2024-12-02 DIAGNOSIS — R1031 Right lower quadrant pain: Secondary | ICD-10-CM | POA: Insufficient documentation

## 2024-12-02 DIAGNOSIS — R194 Change in bowel habit: Secondary | ICD-10-CM | POA: Insufficient documentation

## 2024-12-03 ENCOUNTER — Ambulatory Visit (HOSPITAL_COMMUNITY)
Admission: RE | Admit: 2024-12-03 | Discharge: 2024-12-03 | Disposition: A | Discharge status: Home / Self Care | Source: Ambulatory Visit | Attending: Gastroenterology | Admitting: Gastroenterology

## 2024-12-03 ENCOUNTER — Encounter (HOSPITAL_COMMUNITY): Payer: Self-pay

## 2024-12-03 DIAGNOSIS — R159 Full incontinence of feces: Secondary | ICD-10-CM | POA: Insufficient documentation

## 2024-12-03 DIAGNOSIS — M51369 Other intervertebral disc degeneration, lumbar region without mention of lumbar back pain or lower extremity pain: Secondary | ICD-10-CM | POA: Insufficient documentation

## 2024-12-03 DIAGNOSIS — R194 Change in bowel habit: Secondary | ICD-10-CM | POA: Diagnosis present

## 2024-12-03 DIAGNOSIS — R911 Solitary pulmonary nodule: Secondary | ICD-10-CM | POA: Insufficient documentation

## 2024-12-03 DIAGNOSIS — K8020 Calculus of gallbladder without cholecystitis without obstruction: Secondary | ICD-10-CM | POA: Diagnosis not present

## 2024-12-03 MED ORDER — IOHEXOL 300 MG/ML  SOLN
100.0000 mL | Freq: Once | INTRAMUSCULAR | Status: AC | PRN
  Administered 2024-12-03: 100 mL via INTRAVENOUS

## 2024-12-03 MED ORDER — IOHEXOL 9 MG/ML PO SOLN
1000.0000 mL | ORAL | Status: AC
  Administered 2024-12-03: 1000 mL via ORAL

## 2024-12-04 ENCOUNTER — Encounter: Admitting: Physical Therapy

## 2024-12-04 ENCOUNTER — Encounter: Payer: Self-pay | Admitting: Physical Therapy

## 2024-12-04 DIAGNOSIS — M6281 Muscle weakness (generalized): Secondary | ICD-10-CM

## 2024-12-04 DIAGNOSIS — R159 Full incontinence of feces: Secondary | ICD-10-CM | POA: Diagnosis not present

## 2024-12-04 DIAGNOSIS — R1031 Right lower quadrant pain: Secondary | ICD-10-CM

## 2024-12-04 DIAGNOSIS — R252 Cramp and spasm: Secondary | ICD-10-CM

## 2024-12-04 DIAGNOSIS — R278 Other lack of coordination: Secondary | ICD-10-CM

## 2024-12-04 NOTE — Therapy (Signed)
[facility] at [facility] [address] [place], [place], [address] Phone: [phone]   Fax:  [phone]  Physical Therapy Evaluation  Patient Details  Name: Dana Palmer MRN: [id number] Date of Birth: 1941-04-06 No data recorded  Encounter Date: 12/02/2024    Past Medical History:  Diagnosis Date    Remote Anterior Stemi 05/22/2015   CAD (coronary artery disease)    a. ant-lat STEMI 6/16 >> LHC:  ostial to mid LAD 99% with complex thrombus, mid to dist LAD 99%, D2 80%, prox RCA 50%, mid RCA 50%, EF 35% with ant-apical AK >>  b. s/p CABG (L-LAD, S-Dx)   Chronic diastolic heart failure (HCC) 02/12/2017   Elevated LFTs 08/26/2015   Essential hypertension    Gallstone    HLD (hyperlipidemia)    Hyperlipidemia 06/07/2015   Ischemic cardiomyopathy    a. echo 6/16:  EF 35%, mild LVH, periapical HK, Gr 2 DD;  b.  Echo 7/16:  Mild LVH, mild focal basal septal hypertrophy, EF 60-65%, no RWMA, trivial AI, MAC, mild increased PASP, trivial effusion   Osteoporosis    Pneumonia    Psoriasis    UTI (lower urinary tract infection)     Past Surgical History:  Procedure Laterality Date   CARDIAC CATHETERIZATION N/A 05/22/2015   Procedure: Left Heart Cath and Coronary Angiography;  Surgeon: Victory LELON Sharps, MD;  Location: [facility] INVASIVE CV LAB;  Service: Cardiovascular;  Laterality: N/A;   CARPAL TUNNEL RELEASE     CORONARY ARTERY BYPASS GRAFT     CORONARY ARTERY BYPASS GRAFT N/A 05/22/2015   Procedure: CORONARY ARTERY BYPASS GRAFTING (CABG);  Surgeon: Elspeth JAYSON Millers, MD;  Location: [facility] OR;  Service: Open Heart Surgery;  Laterality: N/A;   TEE WITHOUT CARDIOVERSION  05/22/2015   Procedure: TRANSESOPHAGEAL ECHOCARDIOGRAM (TEE);  Surgeon: Elspeth JAYSON Millers, MD;  Location: [facility] OR;  Service: Open Heart Surgery;;   TONSILLECTOMY     TUBAL LIGATION  1971   WRIST FRACTURE SURGERY Bilateral 11/26/2008   Open reduction    There were no  vitals filed for this visit.                    Objective measurements completed on examination: See above findings.                              Patient will benefit from skilled therapeutic intervention in order to improve the following deficits and impairments:     Visit Diagnosis: Muscle weakness (generalized) - Plan: PT plan of care cert/re-cert  Other lack of coordination - Plan: PT plan of care cert/re-cert  Cramp and spasm - Plan: PT plan of care cert/re-cert  Right lower quadrant abdominal pain - Plan: PT plan of care cert/re-cert     Problem List Patient Active Problem List   Diagnosis Date Noted   Chronic diastolic heart failure (HCC) 02/12/2017   Elevated LFTs 08/26/2015   Elevated hemidiaphragm 08/24/2015   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

## 2024-12-04 NOTE — Therapy (Signed)
 [place] [facility] Outpatient Rehabilitation at [facility] [address] [place], [place], [address] Phone: [phone]   Fax:  [phone]  Physical Therapy Pelvic Evaluation  Patient Details  Name: Daysie Helf MRN: [id number] Date of Birth: 1941-01-22 No data recorded  Encounter Date: 12/04/2024   PT End of Session - 12/04/24 1507     Visit Number 2    Date for Recertification  02/24/25    Authorization Type [employer] medicare    Authorization - Visit Number 2    Authorization - Number of Visits 10    PT Start Time 1500    PT Stop Time 1545    PT Time Calculation (min) 45 min    Activity Tolerance Patient tolerated treatment well    Behavior During Therapy [facility] for tasks assessed/performed          Past Medical History:  Diagnosis Date    Remote Anterior Stemi 05/22/2015   CAD (coronary artery disease)    a. ant-lat STEMI 6/16 >> LHC:  ostial to mid LAD 99% with complex thrombus, mid to dist LAD 99%, D2 80%, prox RCA 50%, mid RCA 50%, EF 35% with ant-apical AK >>  b. s/p CABG (L-LAD, S-Dx)   Chronic diastolic heart failure (HCC) 02/12/2017   Elevated LFTs 08/26/2015   Essential hypertension    Gallstone    HLD (hyperlipidemia)    Hyperlipidemia 06/07/2015   Ischemic cardiomyopathy    a. echo 6/16:  EF 35%, mild LVH, periapical HK, Gr 2 DD;  b.  Echo 7/16:  Mild LVH, mild focal basal septal hypertrophy, EF 60-65%, no RWMA, trivial AI, MAC, mild increased PASP, trivial effusion   Osteoporosis    Pneumonia    Psoriasis    UTI (lower urinary tract infection)     Past Surgical History:  Procedure Laterality Date   CARDIAC CATHETERIZATION N/A 05/22/2015   Procedure: Left Heart Cath and Coronary Angiography;  Surgeon: Victory LELON Sharps, MD;  Location: [facility] INVASIVE CV LAB;  Service: Cardiovascular;  Laterality: N/A;   CARPAL TUNNEL RELEASE     CORONARY ARTERY BYPASS GRAFT     CORONARY ARTERY BYPASS GRAFT N/A 05/22/2015   Procedure: CORONARY ARTERY BYPASS  GRAFTING (CABG);  Surgeon: Elspeth JAYSON Millers, MD;  Location: [facility] OR;  Service: Open Heart Surgery;  Laterality: N/A;   TEE WITHOUT CARDIOVERSION  05/22/2015   Procedure: TRANSESOPHAGEAL ECHOCARDIOGRAM (TEE);  Surgeon: Elspeth JAYSON Millers, MD;  Location: [facility] 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 06/07/2015   Essential hypertension 06/07/2015   Hyperlipidemia 06/07/2015    Remote Anterior Stemi 05/22/2015   Psoriasis 05/22/2015   PCP: Leonarda Roxan JAYSON, NP   REFERRING PROVIDER: Mollie Nestor HERO, PA-C    REFERRING DIAG:  R15.9 (ICD-10-CM) - Incontinence of feces, unspecified fecal incontinence type  R19.4 (ICD-10-CM) - Change in bowel habits      THERAPY DIAG:  Muscle weakness (generalized) - Plan: PT plan of care cert/re-cert   Other lack of coordination - Plan: PT plan of care cert/re-cert   Cramp and spasm - Plan: PT plan  of care cert/re-cert   Right lower quadrant abdominal pain - Plan: PT plan of care cert/re-cert   Rationale for Evaluation and Treatment: Rehabilitation   ONSET DATE: 04/2015   SUBJECTIVE:                                                                                                                                                                                            SUBJECTIVE STATEMENT: I had more leakage after last visit. I had my CAT scan and has not sown anything.   Fluid intake: Coffee all day sips, water, juices   PERTINENT HISTORY:  Medications for current condition: none Surgeries: see above Other: Chronic diastolic heart failure; Osteoporosis Sexual  abuse: No   PAIN:  Are you having pain? Yes NPRS scale: 7/10 Pain location: right lower quadrant   Pain type: sharp and stabbing Pain description: intermittent    Aggravating factors: comes on randomly, comes after a bout of diarrhea   Relieving factors: laying horizontal and will pass on its own   PRECAUTIONS: None   RED FLAGS: None       WEIGHT BEARING RESTRICTIONS: No   FALLS:  Has patient fallen in last 6 months? No   OCCUPATION: retired engineer, civil (consulting)   ACTIVITY LEVEL : gardener   PLOF: Independent   PATIENT GOALS: strengthen the pelvic floor     BOWEL MOVEMENT: Pain with bowel movement: No Type of bowel movement:Type (Bristol Stool Scale) Type 4, 6, 7, Frequency 12 times in the morning, and Strain no Fully empty rectum: Yes:   Leakage: Yes:                                                    Caused by: change of position Bowel urgency: 3 seconds of warning Pads: Yes: 2 pads per day and night Fiber supplement/laxative Metamucil    URINATION: Pain with urination: No Fully empty bladder: Nonot sure due to small stream at night                                         Post-void dribble: No Stream: Strong and Weak Urgency: No Frequency:during the day every couple of hours  Nocturia: No;2 times  4:00 and 5:00 Leakage: none   INTERCOURSE: not active              PREGNANCY: Vaginal deliveries 3 Tearing No C-section deliveries 0   PROLAPSE: None     OBJECTIVE:  Note: Objective measures were completed at Evaluation unless otherwise noted.     PATIENT SURVEYS:  PFIQ-7: 50 CRAIG -7 52   COGNITION: Overall cognitive status: Within functional limits for tasks assessed                          SENSATION: Light touch: Appears intact   FUNCTIONAL TESTS:  Single leg stance:             Rt: has to hold onto something             Lt: has to hold onto something   GAIT: Assistive device utilized: None    POSTURE: decreased lumbar lordosis     LUMBARAROM/PROM: Lumbar ROM decreased by 50%     LOWER EXTREMITY ROM:   Passive ROM Right eval Left eval  Hip external rotation 40 30   (Blank rows = not tested)   LOWER EXTREMITY FFU:apojuzmjo lower extremity strength is 5/5   PALPATION:  Abdominal: decreased movement of the diaphragm             Diastasis: No Distortion: No  Breathing: difficulty with opening the lower rib cage and filling up her abdomen                 External Perineal Exam: intact, skin tag on the anterior portion of the rectum, contract with adding the gluteals                             Internal Pelvic Floor: therapist could only get her index finger past the external anal sphincter due to pain and tightness   Patient confirms identification and approves PT to assess internal pelvic floor and treatment Yes All internal or external pelvic floor assessments and/or treatments are completed with proper hand hygiene and gloves hands. If needed gloves are changed with hand hygiene during patient care time. No emotional/communication barriers or cognitive limitation. Patient is motivated to learn. Patient understands and agrees with treatment goals and plan. PT explains patient will be examined in standing, sitting, and lying down to see how their muscles and joints work. When they are ready, they will be asked to remove their underwear so PT can examine their perineum. The patient is also given the option of providing their own chaperone as one is not provided in our facility. The patient also has the right and is explained the right to defer or refuse any part of the evaluation or treatment including the internal exam. With the patient's consent, PT will use one gloved finger to gently assess the muscles of the pelvic floor, seeing how well it contracts and relaxes and if there is muscle symmetry. After, the patient will get dressed and PT and patient will discuss exam findings  and plan of care. PT and patient discuss plan of care, schedule, attendance policy and HEP activities.    PELVIC MMT:   MMT eval  Vaginal    Internal Anal Sphincter    External Anal Sphincter 1/5  Puborectalis    (Blank rows = not tested)           TODAY'S TREATMENT:  12/04/24 Manual: Soft tissue mobilization: Circular massage to the abdomen to promote peristalic motion of the intestines Manual work to the diaphragm Scar tissue mobilization: Manual work to the scars on the abdomen Myofascial release: Fascial release to the lower abdomen Neuromuscular re-education: Core facilitation: Supine transverse abdominus contraction with ball squeeze Supine hip flexion isometric with abdominal contraction Supine alternate shoulder flexion with core engaged Down training: Diaphragmatic breathing in supine Diaphragmatic breathing while sitting on a ball and was able to feel the pelvic floor relax                                                                                                                            DATE: 12/02/24  EVAL Examination completed, findings reviewed, pt educated on POC, HEP, and female pelvic floor anatomy, reasoning with pelvic floor assessment internally with pt consent. Pt motivated to participate in PT and agreeable to attempt recommendations.        PATIENT EDUCATION:  12/02/24 Education details: Access Code: [email or web address] Person educated: Patient Education method: Explanation, Demonstration, Tactile cues, Verbal cues, and Handouts Education comprehension: verbalized understanding, returned demonstration, verbal cues required, tactile cues required, and needs further education   HOME EXERCISE PROGRAM: 12/02/24 Access Code: [email or web address] URL: [email or web address] Date: 12/04/2024 Prepared by: Channing Pereyra  Program Notes sit on tennis ball to massage the pelvic floor 1 time per day. sit more on your sitting bones to keep the muscle relaxed.    Exercises - Seated Diaphragmatic Breathing  - 3 x daily - 7 x weekly - 1 sets - 10 reps - Seated Piriformis Stretch with Trunk Bend  - 1 x daily - 7 x weekly - 1 sets - 2 reps - 30 sec hold - Hooklying Isometric Hip Flexion  - 1 x daily - 7 x weekly - 1 sets - 10 reps - Dead Bug Alternating Arm Extension  - 1 x daily - 7 x weekly - 1 sets - 10 reps  Patient Education - Abdominal Massage for Constipation   ASSESSMENT:   CLINICAL IMPRESSION: Patient is a 84 y.o. female who was seen today for physical therapy  treatment for fecal incontinence. Patient was able to feel her pelvic floor relax with the diaphragmatic breathing. She will bulge her lower abdomen when laughing so she was educated on how to contract them instead. She reports more fecal leakage after the initial eval. She understands how to perform abdominal massage.  Patient will benefit from skilled therapy to reduce her fecal leakage so she is able to be out in public to socialize.    OBJECTIVE IMPAIRMENTS: decreased activity tolerance, decreased coordination, decreased endurance, decreased strength, increased fascial restrictions, increased muscle spasms, and pain.    ACTIVITY LIMITATIONS: sleeping, continence, and toileting   PARTICIPATION LIMITATIONS: meal prep, cleaning, laundry, driving, shopping, community activity, and yard work   PERSONAL FACTORS: 1-2 comorbidities: Chronic diastolic heart failure; Osteoporosis are also affecting patient's functional outcome.    REHAB POTENTIAL: Excellent  CLINICAL DECISION MAKING: Evolving/moderate complexity   EVALUATION COMPLEXITY: Moderate     GOALS: Goals reviewed with patient? Yes   SHORT TERM GOALS: Target date: 12/30/24   Patient able to perform diaphragmatic breathing to relax her pelvic floor for full range of the sphincter muscles.  Baseline: Goal status: Met 12/04/24   2.  Patient understands how to sit on the commode and fully relax her pelvic floor to fully expel  firm stool.  Baseline:  Goal status: INITIAL   3.  Patient educated on abdominal massage to assist with stool formation.  Baseline:  Goal status: INITIAL   4.  Patient educated on bowel health with chewing her food well, timed toileting after a meal.  Baseline:  Goal status: INITIAL     LONG TERM GOALS: Target date: 02/24/25   Patient independent with advanced HEP for core and pelvic floor to reduce the amount of fecal leakage.  Baseline:  Goal status: INITIAL   2.  Patient is able to go out of her house 50% more of the time due to the fecal leakage decreased >/= 50%.  Baseline:  Goal status: INITIAL   3.  Rectal strength increased >/= 3/5 so patient is able to avoid stool leakage as she is walking to the bathroom.  Baseline:  Goal status: INITIAL   4.  Patient reports her right lower abdominal pain has decreased </= 2/10 and not as frequent due to improved consistency of her stool to Type 4 more of the time.  Baseline:  Goal status: INITIAL   5.  CRAIG -7 score is l</= 20 compared to 52 Baseline:  Goal status: INITIAL     PLAN:   PT FREQUENCY: 1-2x/week   PT DURATION: 12 weeks   PLANNED INTERVENTIONS: 97110-Therapeutic exercises, 97530- Therapeutic activity, 97112- Neuromuscular re-education, 97535- Self Care, 02859- Manual therapy, [id number]- Electrical stimulation (unattended), 20560 (1-2 muscles), 20561 (3+ muscles)- Dry Needling, Patient/Family education, Cryotherapy, Moist heat, and Biofeedback   PLAN FOR NEXT SESSION: diaphragmatic breathing, see how abdominal massage, rib mobility, hip stretches, manual work around the rectum and anococcygeal ligament; bowel health   Channing Pereyra, PT 12/04/2024 3:51 PM  [place]  Outpatient Rehabilitation at [facility] [address] [place], [place], [address] Phone: [phone]   Fax:  [phone]  Name: Alley Neils MRN: [id number] Date of Birth: [birth year]

## 2024-12-09 ENCOUNTER — Encounter: Payer: Self-pay | Admitting: Physical Therapy

## 2024-12-09 DIAGNOSIS — R159 Full incontinence of feces: Secondary | ICD-10-CM | POA: Diagnosis not present

## 2024-12-09 DIAGNOSIS — M6281 Muscle weakness (generalized): Secondary | ICD-10-CM

## 2024-12-09 DIAGNOSIS — R252 Cramp and spasm: Secondary | ICD-10-CM

## 2024-12-09 DIAGNOSIS — R1031 Right lower quadrant pain: Secondary | ICD-10-CM

## 2024-12-09 DIAGNOSIS — R278 Other lack of coordination: Secondary | ICD-10-CM

## 2024-12-09 NOTE — Therapy (Signed)
 [facility] at [facility] [address] [place], [place], [address] Phone: [phone]   Fax:  [phone]  Physical Therapy Pelvic Evaluation  Patient Details  Name: Dana Palmer MRN: [id number] Date of Birth: Apr 01, 1941 No data recorded  Encounter Date: 12/09/2024   PT End of Session - 12/09/24 1307     Visit Number 3    Date for Recertification  02/24/25    Authorization Type [employer] medicare    Authorization - Visit Number 3    Authorization - Number of Visits 10    PT Start Time 1300    PT Stop Time 1345    PT Time Calculation (min) 45 min    Activity Tolerance Patient tolerated treatment well    Behavior During Therapy [facility] for tasks assessed/performed          Past Medical History:  Diagnosis Date    Remote Anterior Stemi 05/22/2015   CAD (coronary artery disease)    a. ant-lat STEMI 6/16 >> LHC:  ostial to mid LAD 99% with complex thrombus, mid to dist LAD 99%, D2 80%, prox RCA 50%, mid RCA 50%, EF 35% with ant-apical AK >>  b. s/p CABG (L-LAD, S-Dx)   Chronic diastolic heart failure (HCC) 02/12/2017   Elevated LFTs 08/26/2015   Essential hypertension    Gallstone    HLD (hyperlipidemia)    Hyperlipidemia 06/07/2015   Ischemic cardiomyopathy    a. echo 6/16:  EF 35%, mild LVH, periapical HK, Gr 2 DD;  b.  Echo 7/16:  Mild LVH, mild focal basal septal hypertrophy, EF 60-65%, no RWMA, trivial AI, MAC, mild increased PASP, trivial effusion   Osteoporosis    Pneumonia    Psoriasis    UTI (lower urinary tract infection)     Past Surgical History:  Procedure Laterality Date   CARDIAC CATHETERIZATION N/A 05/22/2015   Procedure: Left Heart Cath and Coronary Angiography;  Surgeon: Victory LELON Sharps, MD;  Location: [facility] INVASIVE CV LAB;  Service: Cardiovascular;  Laterality: N/A;   CARPAL TUNNEL RELEASE     CORONARY ARTERY BYPASS GRAFT     CORONARY ARTERY BYPASS GRAFT N/A 05/22/2015   Procedure: CORONARY ARTERY BYPASS  GRAFTING (CABG);  Surgeon: Elspeth JAYSON Millers, MD;  Location: [facility] OR;  Service: Open Heart Surgery;  Laterality: N/A;   TEE WITHOUT CARDIOVERSION  05/22/2015   Procedure: TRANSESOPHAGEAL ECHOCARDIOGRAM (TEE);  Surgeon: Elspeth JAYSON Millers, MD;  Location: [facility] 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 06/07/2015   Essential hypertension 06/07/2015   Hyperlipidemia 06/07/2015    Remote Anterior Stemi 05/22/2015   Psoriasis 05/22/2015   PCP: Leonarda Roxan JAYSON, NP   REFERRING PROVIDER: Mollie Nestor HERO, PA-C    REFERRING DIAG:  R15.9 (ICD-10-CM) - Incontinence of feces, unspecified fecal incontinence type  R19.4 (ICD-10-CM) - Change in bowel habits      THERAPY DIAG:  Muscle weakness (generalized) - Plan: PT plan of care cert/re-cert   Other lack of coordination - Plan: PT plan of care cert/re-cert   Cramp and spasm - Plan: PT plan  of care cert/re-cert   Right lower quadrant abdominal pain - Plan: PT plan of care cert/re-cert   Rationale for Evaluation and Treatment: Rehabilitation   ONSET DATE: 04/2015   SUBJECTIVE:                                                                                                                                                                                            SUBJECTIVE STATEMENT: I am having 2 separate episodes of diarrhea. Now I ham having fecal leakage.  I had more leakage after last visit. I had my CAT scan and has not sown anything.   Fluid intake: Coffee all day sips, water, juices   PERTINENT HISTORY:  Medications for current condition:  none Surgeries: see above Other: Chronic diastolic heart failure; Osteoporosis Sexual abuse: No   PAIN:  Are you having pain? Yes NPRS scale: 7/10 Pain location: right lower quadrant   Pain type: sharp and stabbing Pain description: intermittent    Aggravating factors: comes on randomly, comes after a bout of diarrhea   Relieving factors: laying horizontal and will pass on its own   PRECAUTIONS: None   RED FLAGS: None       WEIGHT BEARING RESTRICTIONS: No   FALLS:  Has patient fallen in last 6 months? No   OCCUPATION: retired engineer, civil (consulting)   ACTIVITY LEVEL : gardener   PLOF: Independent   PATIENT GOALS: strengthen the pelvic floor     BOWEL MOVEMENT: Pain with bowel movement: No Type of bowel movement:Type (Bristol Stool Scale) Type 4, 6, 7, Frequency 12 times in the morning, and Strain no Fully empty rectum: Yes:   Leakage: Yes:                                                    Caused by: change of position Bowel urgency: 3 seconds of warning Pads: Yes: 2 pads per day and night Fiber supplement/laxative Metamucil    URINATION: Pain with urination: No Fully empty bladder: Nonot sure due to small stream at night                                         Post-void dribble: No Stream: Strong and Weak Urgency: No Frequency:during the day every couple of hours  Nocturia: No;2 times  4:00 and 5:00 Leakage: none   INTERCOURSE: not active              PREGNANCY: Vaginal deliveries 3 Tearing No C-section deliveries 0   PROLAPSE: None     OBJECTIVE:  Note: Objective measures were completed at Evaluation unless otherwise noted.     PATIENT SURVEYS:  PFIQ-7: 50 CRAIG -7 52   COGNITION: Overall cognitive status: Within functional limits for tasks assessed                          SENSATION: Light touch: Appears intact   FUNCTIONAL TESTS:  Single leg stance:             Rt: has to hold onto something              Lt: has to hold onto something   GAIT: Assistive device utilized: None   POSTURE: decreased lumbar lordosis     LUMBARAROM/PROM: Lumbar ROM decreased by 50%     LOWER EXTREMITY ROM:   Passive ROM Right eval Left eval  Hip external rotation 40 30   (Blank rows = not tested)   LOWER EXTREMITY FFU:apojuzmjo lower extremity strength is 5/5   PALPATION:  Abdominal: decreased movement of the diaphragm             Diastasis: No Distortion: No  Breathing: difficulty with opening the lower rib cage and filling up her abdomen                 External Perineal Exam: intact, skin tag on the anterior portion of the rectum, contract with adding the gluteals                             Internal Pelvic Floor: therapist could only get her index finger past the external anal sphincter due to pain and tightness   Patient confirms identification and approves PT to assess internal pelvic floor and treatment Yes All internal or external pelvic floor assessments and/or treatments are completed with proper hand hygiene and gloves hands. If needed gloves are changed with hand hygiene during patient care time. No emotional/communication barriers or cognitive limitation. Patient is motivated to learn. Patient understands and agrees with treatment goals and plan. PT explains patient will be examined in standing, sitting, and lying down to see how their muscles and joints work. When they are ready, they will be asked to remove their underwear so PT can examine their perineum. The patient is also given the option of providing their own chaperone as one is not provided in our facility. The patient also has the right and is explained the right to defer or refuse any part of the evaluation or treatment including the internal exam. With the patient's consent, PT will use one gloved finger to gently assess the muscles of the pelvic floor, seeing how well it contracts and relaxes and if there is muscle  symmetry. After, the patient will get dressed and PT and patient will discuss exam findings and plan of care. PT and patient discuss plan of care, schedule, attendance policy and HEP activities.    PELVIC MMT:   MMT eval  Vaginal    Internal Anal Sphincter    External Anal Sphincter 1/5  Puborectalis    (Blank rows = not tested)           TODAY'S TREATMENT:  12/09/24 Neuromuscular re-education: Down training: Sitting on ball with diaphragmatic breathing to relax the pelvic floor Therapeutic activities: Functional strengthening activities: Educated patient on how to sit on the commode and breath to relax the pelvic floor and push stool out then contract the pelvic floor to reduce fecal leakage Self-care: Educated patient on soluble and insoluble fiber, how it affects the intestines to work more on insoluble fiber to decrease the water in the stool.  Educated patient on bowel health for chewing her food fully, eating at same time of day and same size meal Educated patient on how diarrhea could come around the firm stool and cause leakage.      12/04/24 Manual: Soft tissue mobilization: Circular massage to the abdomen to promote peristalic motion of the intestines Manual work to the diaphragm Scar tissue mobilization: Manual work to the scars on the abdomen Myofascial release: Fascial release to the lower abdomen Neuromuscular re-education: Core facilitation: Supine transverse abdominus contraction with ball squeeze Supine hip flexion isometric with abdominal contraction Supine alternate shoulder flexion with core engaged Down training: Diaphragmatic breathing in supine Diaphragmatic breathing while sitting on a ball and was able to feel the pelvic floor relax                                                                                                                            DATE: 12/02/24  EVAL Examination completed, findings reviewed, pt educated on POC, HEP, and  female pelvic floor anatomy, reasoning with pelvic floor assessment internally with pt consent. Pt motivated to participate in PT and agreeable to attempt recommendations.        PATIENT EDUCATION:  12/02/24 Education details: Access Code: [email or web address] Person educated: Patient Education method: Explanation, Demonstration, Tactile cues, Verbal cues, and Handouts Education comprehension: verbalized understanding, returned demonstration, verbal cues required, tactile cues required, and needs further education   HOME EXERCISE PROGRAM: 12/02/24 Access Code: [email or web address] URL: [email or web address] Date: 12/04/2024 Prepared by: Channing Pereyra  Program Notes sit on tennis ball to massage the pelvic floor 1 time per day. sit more on your sitting bones to keep the muscle relaxed.   Exercises - Seated Diaphragmatic Breathing  - 3 x daily - 7 x weekly - 1 sets - 10 reps - Seated Piriformis Stretch with Trunk Bend  - 1 x daily - 7 x weekly - 1 sets - 2 reps - 30 sec hold - Hooklying Isometric Hip Flexion  - 1 x daily - 7 x weekly - 1 sets - 10 reps - Dead Bug Alternating Arm Extension  - 1 x daily - 7 x weekly - 1 sets - 10 reps  Patient Education - Abdominal Massage for Constipation   ASSESSMENT:   CLINICAL IMPRESSION: Patient is a 84 y.o. female who was seen today for physical therapy  treatment for fecal incontinence. Patient was able to feel her pelvic floor relax with the diaphragmatic breathing while sitting on the ball. Patient understands how a tight  pelvic floor has difficulty contracting to stop fecal leakage. She is understanding how the exercises strengthen the core and pelvic floor.   Patient will benefit from skilled therapy to reduce her fecal leakage so she is able to be out in public to socialize.    OBJECTIVE IMPAIRMENTS: decreased activity tolerance, decreased coordination, decreased endurance, decreased strength, increased fascial restrictions, increased muscle spasms, and  pain.    ACTIVITY LIMITATIONS: sleeping, continence, and toileting   PARTICIPATION LIMITATIONS: meal prep, cleaning, laundry, driving, shopping, community activity, and yard work   PERSONAL FACTORS: 1-2 comorbidities: Chronic diastolic heart failure; Osteoporosis are also affecting patient's functional outcome.    REHAB POTENTIAL: Excellent   CLINICAL DECISION MAKING: Evolving/moderate complexity   EVALUATION COMPLEXITY: Moderate     GOALS: Goals reviewed with patient? Yes   SHORT TERM GOALS: Target date: 12/30/24   Patient able to perform diaphragmatic breathing to relax her pelvic floor for full range of the sphincter muscles.  Baseline: Goal status: Met 12/04/24   2.  Patient understands how to sit on the commode and fully relax her pelvic floor to fully expel firm stool.  Baseline:  Goal status: Met 12/09/24   3.  Patient educated on abdominal massage to assist with stool formation.  Baseline:  Goal status: INITIAL   4.  Patient educated on bowel health with chewing her food well, timed toileting after a meal.  Baseline:  Goal status: Met 12/09/24     LONG TERM GOALS: Target date: 02/24/25   Patient independent with advanced HEP for core and pelvic floor to reduce the amount of fecal leakage.  Baseline:  Goal status: INITIAL   2.  Patient is able to go out of her house 50% more of the time due to the fecal leakage decreased >/= 50%.  Baseline:  Goal status: INITIAL   3.  Rectal strength increased >/= 3/5 so patient is able to avoid stool leakage as she is walking to the bathroom.  Baseline:  Goal status: INITIAL   4.  Patient reports her right lower abdominal pain has decreased </= 2/10 and not as frequent due to improved consistency of her stool to Type 4 more of the time.  Baseline:  Goal status: INITIAL   5.  CRAIG -7 score is l</= 20 compared to 52 Baseline:  Goal status: INITIAL     PLAN:   PT FREQUENCY: 1-2x/week   PT DURATION: 12 weeks   PLANNED  INTERVENTIONS: 97110-Therapeutic exercises, 97530- Therapeutic activity, 97112- Neuromuscular re-education, 97535- Self Care, 02859- Manual therapy, G0283- Electrical stimulation (unattended), 20560 (1-2 muscles), 20561 (3+ muscles)- Dry Needling, Patient/Family education, Cryotherapy, Moist heat, and Biofeedback   PLAN FOR NEXT SESSION: diaphragmatic breathing, see how abdominal massage, rib mobility, hip stretches, manual work around the rectum and anococcygeal ligament, see if eating insoluble fiber has helped   Channing Pereyra, PT 12/09/2024 1:57 PM  [place] [address] [place], [place], [address] Phone: [phone]   Fax:  [phone]  Name: Kassadee Carawan MRN: [id number] Date of Birth: 1941-07-20

## 2024-12-09 NOTE — Patient Instructions (Addendum)
" ° °  [facility] [address] [place], [place] [address] Phone # [phone] Fax [phone]  How To Poop Better:  What are Good Poops? There is no one exact normal, but they should be REGULAR.  This varies from person to person and ranges from up to 3x/day or as little as 3-4/week.  This should stay consistent for you.  They should be formed and ideally one solid mass that doesn't fall apart or dissolve in the water and is brown in color.  Lifestyle Tips:  Fiber: Eat 25-31 grams per day Do not hold it.  If you need to go, GO! Try to go every day around the same time Walk and move more Probiotics for more healthy gut bacteria Water and fluids: half of your healthy body weight in ounces  Toileting Tips:  Posture: knees above hips, back flat, look straight ahead, RELAX Relax all the muscles from your face down to your toes Breathe: slow deep breaths into your belly and pelvic floor is RELAXED Blow: Tighten belly and blow like blowing up a balloon, make SH sound, make a vowel sound with a deep voice Do NOT sit more than 10 minutes After you are finished, tighten the muscles to reset pelvic floor back to normal       "

## 2024-12-16 ENCOUNTER — Encounter: Payer: Self-pay | Admitting: Physical Therapy

## 2024-12-16 DIAGNOSIS — M6281 Muscle weakness (generalized): Secondary | ICD-10-CM

## 2024-12-16 DIAGNOSIS — R1031 Right lower quadrant pain: Secondary | ICD-10-CM

## 2024-12-16 DIAGNOSIS — R278 Other lack of coordination: Secondary | ICD-10-CM

## 2024-12-16 DIAGNOSIS — R159 Full incontinence of feces: Secondary | ICD-10-CM | POA: Diagnosis not present

## 2024-12-16 DIAGNOSIS — R252 Cramp and spasm: Secondary | ICD-10-CM

## 2024-12-16 NOTE — Therapy (Signed)
 [place] [facility] at [facility] [address], [address] [place], [place], [address] Phone: [phone]   Fax:  [phone]  Physical Therapy Pelvic Evaluation  Patient Details  Name: Dana Palmer MRN: [id number] Date of Birth: 07/20/1941 No data recorded  Encounter Date: 12/16/2024   PT End of Session - 12/16/24 1308     Visit Number 4    Date for Recertification  02/24/25    Authorization Type [employer] medicare    Authorization Time Period 1/6-3/3    Authorization - Visit Number 4    Authorization - Number of Visits 16    Progress Note Due on Visit 10    PT Start Time 1300    PT Stop Time 1345    PT Time Calculation (min) 45 min    Activity Tolerance Patient tolerated treatment well    Behavior During Therapy [facility] for tasks assessed/performed          Past Medical History:  Diagnosis Date    Remote Anterior Stemi 05/22/2015   CAD (coronary artery disease)    a. ant-lat STEMI 6/16 >> LHC:  ostial to mid LAD 99% with complex thrombus, mid to dist LAD 99%, D2 80%, prox RCA 50%, mid RCA 50%, EF 35% with ant-apical AK >>  b. s/p CABG (L-LAD, S-Dx)   Chronic diastolic heart failure (HCC) 02/12/2017   Elevated LFTs 08/26/2015   Essential hypertension    Gallstone    HLD (hyperlipidemia)    Hyperlipidemia 06/07/2015   Ischemic cardiomyopathy    a. echo 6/16:  EF 35%, mild LVH, periapical HK, Gr 2 DD;  b.  Echo 7/16:  Mild LVH, mild focal basal septal hypertrophy, EF 60-65%, no RWMA, trivial AI, MAC, mild increased PASP, trivial effusion   Osteoporosis    Pneumonia    Psoriasis    UTI (lower urinary tract infection)     Past Surgical History:  Procedure Laterality Date   CARDIAC CATHETERIZATION N/A 05/22/2015   Procedure: Left Heart Cath and Coronary Angiography;  Surgeon: Victory LELON Sharps, MD;  Location: [facility] INVASIVE CV LAB;  Service: Cardiovascular;  Laterality: N/A;   CARPAL TUNNEL RELEASE     CORONARY ARTERY BYPASS GRAFT     CORONARY  ARTERY BYPASS GRAFT N/A 05/22/2015   Procedure: CORONARY ARTERY BYPASS GRAFTING (CABG);  Surgeon: Elspeth JAYSON Millers, MD;  Location: [facility] OR;  Service: Open Heart Surgery;  Laterality: N/A;   TEE WITHOUT CARDIOVERSION  05/22/2015   Procedure: TRANSESOPHAGEAL ECHOCARDIOGRAM (TEE);  Surgeon: Elspeth JAYSON Millers, MD;  Location: [facility] 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 06/07/2015   Essential hypertension 06/07/2015   Hyperlipidemia 06/07/2015    Remote Anterior Stemi 05/22/2015   Psoriasis 05/22/2015   PCP: Leonarda Roxan JAYSON, NP   REFERRING PROVIDER: Mollie Nestor HERO, PA-C    REFERRING DIAG:  R15.9 (ICD-10-CM) - Incontinence of feces, unspecified fecal incontinence type  R19.4 (ICD-10-CM) - Change in bowel habits      THERAPY DIAG:  Muscle weakness (generalized) - Plan: PT plan of care cert/re-cert   Other lack of coordination -  Plan: PT plan of care cert/re-cert   Cramp and spasm - Plan: PT plan of care cert/re-cert   Right lower quadrant abdominal pain - Plan: PT plan of care cert/re-cert   Rationale for Evaluation and Treatment: Rehabilitation   ONSET DATE: 04/2015   SUBJECTIVE:                                                                                                                                                                                            SUBJECTIVE STATEMENT: I have done the exercises 2 nights in a row. Patient has not had explosive diarrhea and still has some leakage.   Fluid intake: Coffee all day sips, water, juices   PERTINENT HISTORY:  Medications for current  condition: none Surgeries: see above Other: Chronic diastolic heart failure; Osteoporosis Sexual abuse: No   PAIN:  Are you having pain? Yes NPRS scale: 7/10 Pain location: right lower quadrant   Pain type: sharp and stabbing Pain description: intermittent    Aggravating factors: comes on randomly, comes after a bout of diarrhea   Relieving factors: laying horizontal and will pass on its own   PRECAUTIONS: None   RED FLAGS: None       WEIGHT BEARING RESTRICTIONS: No   FALLS:  Has patient fallen in last 6 months? No   OCCUPATION: retired engineer, civil (consulting)   ACTIVITY LEVEL : gardener   PLOF: Independent   PATIENT GOALS: strengthen the pelvic floor     BOWEL MOVEMENT: Pain with bowel movement: No Type of bowel movement:Type (Bristol Stool Scale) Type 4, 6, 7, Frequency 12 times in the morning, and Strain no Fully empty rectum: Yes:   Leakage: Yes:                                                    Caused by: change of position Bowel urgency: 3 seconds of warning Pads: Yes: 2 pads per day and night Fiber supplement/laxative Metamucil    URINATION: Pain with urination: No Fully empty bladder: Nonot sure due to small stream at night                                         Post-void dribble: No Stream: Strong and Weak Urgency: No Frequency:during the day every couple of hours  Nocturia: No;2 times  4:00 and 5:00 Leakage: none   INTERCOURSE: not active              PREGNANCY: Vaginal deliveries 3 Tearing No C-section deliveries 0   PROLAPSE: None     OBJECTIVE:  Note: Objective measures were completed at Evaluation unless otherwise noted.     PATIENT SURVEYS:  PFIQ-7: 50 CRAIG -7 52   COGNITION: Overall cognitive status: Within functional limits for tasks assessed                          SENSATION: Light touch: Appears intact   FUNCTIONAL TESTS:  Single leg stance:             Rt: has to hold onto  something             Lt: has to hold onto something   GAIT: Assistive device utilized: None   POSTURE: decreased lumbar lordosis     LUMBARAROM/PROM: Lumbar ROM decreased by 50%     LOWER EXTREMITY ROM:   Passive ROM Right eval Left eval  Hip external rotation 40 30   (Blank rows = not tested)   LOWER EXTREMITY FFU:apojuzmjo lower extremity strength is 5/5   PALPATION:  Abdominal: decreased movement of the diaphragm             Diastasis: No Distortion: No  Breathing: difficulty with opening the lower rib cage and filling up her abdomen                 External Perineal Exam: intact, skin tag on the anterior portion of the rectum, contract with adding the gluteals                             Internal Pelvic Floor: therapist could only get her index finger past the external anal sphincter due to pain and tightness   Patient confirms identification and approves PT to assess internal pelvic floor and treatment Yes All internal or external pelvic floor assessments and/or treatments are completed with proper hand hygiene and gloves hands. If needed gloves are changed with hand hygiene during patient care time. No emotional/communication barriers or cognitive limitation. Patient is motivated to learn. Patient understands and agrees with treatment goals and plan. PT explains patient will be examined in standing, sitting, and lying down to see how their muscles and joints work. When they are ready, they will be asked to remove their underwear so PT can examine their perineum. The patient is also given the option of providing their own chaperone as one is not provided in our facility. The patient also has the right and is explained the right to defer or refuse any part of the evaluation or treatment including the internal exam. With the patient's consent, PT will use one gloved finger to gently assess the muscles of the pelvic floor, seeing how well it contracts and relaxes and if there is  muscle symmetry. After, the patient will get dressed and PT and patient will discuss exam findings and plan of care. PT and patient discuss plan of care, schedule, attendance policy and HEP activities.    PELVIC MMT:   MMT eval  Vaginal    Internal Anal Sphincter    External Anal Sphincter 1/5  Puborectalis    (Blank rows = not tested)           TODAY'S TREATMENT:  12/16/24 Manual: Soft tissue mobilization: Circular massage to the abdomen to promote peristalic motion of the intestines then reviewed with patient and she returned demonstration Neuromuscular re-education: Core facilitation: Supine hip flexion isometric with abdominal contraction Supine alternate shoulder flexion with core engaged Supine alternate shoulder and hip flexion 20 x  Supine bridge with pressing yoga block between hands 20 x  Leaning on the counter with hip extension with knee flexed 10 x each side Self-care: Discussed with patient on not sipping her coffee all day and instead drink the 1/2 cup at one time.     12/09/24 Neuromuscular re-education: Down training: Sitting on ball with diaphragmatic breathing to relax the pelvic floor Therapeutic activities: Functional strengthening activities: Educated patient on how to sit on the commode and breath to relax the pelvic floor and push stool out then contract the pelvic floor to reduce fecal leakage Self-care: Educated patient on soluble and insoluble fiber, how it affects the intestines to work more on insoluble fiber to decrease the water in the stool.  Educated patient on bowel health for chewing her food fully, eating at same time of day and same size meal Educated patient on how diarrhea could come around the firm stool and cause leakage.      12/04/24 Manual: Soft tissue mobilization: Circular massage to the abdomen to promote peristalic motion of the intestines Manual work to the diaphragm Scar tissue mobilization: Manual work to the scars on  the abdomen Myofascial release: Fascial release to the lower abdomen Neuromuscular re-education: Core facilitation: Supine transverse abdominus contraction with ball squeeze Supine hip flexion isometric with abdominal contraction Supine alternate shoulder flexion with core engaged Down training: Diaphragmatic breathing in supine Diaphragmatic breathing while sitting on a ball and was able to feel the pelvic floor relax                                                                                                                            DATE: 12/02/24  EVAL Examination completed, findings reviewed, pt educated on POC, HEP, and female pelvic floor anatomy, reasoning with pelvic floor assessment internally with pt consent. Pt motivated to participate in PT and agreeable to attempt recommendations.        PATIENT EDUCATION:  12/16/24 Education details: Access Code: [email or web address] Person educated: Patient Education method: Explanation, Demonstration, Tactile cues, Verbal cues, and Handouts Education comprehension: verbalized understanding, returned demonstration, verbal cues required, tactile cues required, and needs further education   HOME EXERCISE PROGRAM: 12/16/24 Access Code: [email or web address] URL: [email or web address] Date: 12/16/2024 Prepared by: Channing Pereyra  Program Notes sit on tennis ball to massage the pelvic floor 1 time per day. sit more on your sitting bones to keep the muscle relaxed.   Exercises - Seated Diaphragmatic Breathing  - 3 x daily - 7 x weekly - 1 sets - 10 reps - Seated Piriformis Stretch with Trunk Bend  - 1 x daily - 7 x weekly - 1 sets - 2 reps -  30 sec hold - Dead Bug  - 1 x daily - 7 x weekly - 2 sets - 10 reps - Bridge with Arms Raised  - 1 x daily - 7 x weekly - 2 sets - 10 reps - Hip Extension with Single Leg Support Prone on Table Edge  - 1 x daily - 7 x weekly - 1 sets - 10 reps  Patient Education - Abdominal Massage for Constipation    ASSESSMENT:   CLINICAL IMPRESSION: Patient is a 84 y.o. female who was seen today for physical therapy  treatment for fecal incontinence. Patient has not had explosive diarrhea since last visit with changing to more soluble fiber. She has had some fecal leakage. She is able to do the abdominal massage correctly.   Patient will benefit from skilled therapy to reduce her fecal leakage so she is able to be out in public to socialize.    OBJECTIVE IMPAIRMENTS: decreased activity tolerance, decreased coordination, decreased endurance, decreased strength, increased fascial restrictions, increased muscle spasms, and pain.    ACTIVITY LIMITATIONS: sleeping, continence, and toileting   PARTICIPATION LIMITATIONS: meal prep, cleaning, laundry, driving, shopping, community activity, and yard work   PERSONAL FACTORS: 1-2 comorbidities: Chronic diastolic heart failure; Osteoporosis are also affecting patient's functional outcome.    REHAB POTENTIAL: Excellent   CLINICAL DECISION MAKING: Evolving/moderate complexity   EVALUATION COMPLEXITY: Moderate     GOALS: Goals reviewed with patient? Yes   SHORT TERM GOALS: Target date: 12/30/24   Patient able to perform diaphragmatic breathing to relax her pelvic floor for full range of the sphincter muscles.  Baseline: Goal status: Met 12/04/24   2.  Patient understands how to sit on the commode and fully relax her pelvic floor to fully expel firm stool.  Baseline:  Goal status: Met 12/09/24   3.  Patient educated on abdominal massage to assist with stool formation.  Baseline:  Goal status: Met 12/16/24   4.  Patient educated on bowel health with chewing her food well, timed toileting after a meal.  Baseline:  Goal status: Met 12/09/24     LONG TERM GOALS: Target date: 02/24/25   Patient independent with advanced HEP for core and pelvic floor to reduce the amount of fecal leakage.  Baseline:  Goal status: INITIAL   2.  Patient is able to go out of  her house 50% more of the time due to the fecal leakage decreased >/= 50%.  Baseline:  Goal status: INITIAL   3.  Rectal strength increased >/= 3/5 so patient is able to avoid stool leakage as she is walking to the bathroom.  Baseline:  Goal status: INITIAL   4.  Patient reports her right lower abdominal pain has decreased </= 2/10 and not as frequent due to improved consistency of her stool to Type 4 more of the time.  Baseline:  Goal status: INITIAL   5.  CRAIG -7 score is l</= 20 compared to 52 Baseline:  Goal status: INITIAL     PLAN:   PT FREQUENCY: 1-2x/week   PT DURATION: 12 weeks   PLANNED INTERVENTIONS: 97110-Therapeutic exercises, 97530- Therapeutic activity, 97112- Neuromuscular re-education, 97535- Self Care, [id number]- Manual therapy, [id number]- Electrical stimulation (unattended), 20560 (1-2 muscles), 20561 (3+ muscles)- Dry Needling, Patient/Family education, Cryotherapy, Moist heat, and Biofeedback   PLAN FOR NEXT SESSION: hip stretches, manual work around the rectum and anococcygeal ligament, core and hip strength  Channing Pereyra, PT 12/16/24 1:58 PM  [facility] at  [facility] [address], [address] [place], [place], [address] Phone: [phone]   Fax:  [phone]  Name: Dana Palmer MRN: [id number] Date of Birth: 08-01-1941

## 2024-12-24 NOTE — Progress Notes (Unsigned)
 "  Chief Complaint: Primary GI MD: Dr. Abran  HPI:  *** is a  ***  who was referred to me by Ngetich, Roxan BROCKS, NP for a complaint of *** .     Discussed the use of AI scribe software for clinical note transcription with the patient, who gave verbal consent to proceed.  History of Present Illness      PREVIOUS GI WORKUP     Past Medical History:  Diagnosis Date    Remote Anterior Stemi 05/22/2015   CAD (coronary artery disease)    a. ant-lat STEMI 6/16 >> LHC:  ostial to mid LAD 99% with complex thrombus, mid to dist LAD 99%, D2 80%, prox RCA 50%, mid RCA 50%, EF 35% with ant-apical AK >>  b. s/p CABG (L-LAD, S-Dx)   Chronic diastolic heart failure (HCC) 02/12/2017   Elevated LFTs 08/26/2015   Essential hypertension    Gallstone    HLD (hyperlipidemia)    Hyperlipidemia 06/07/2015   Ischemic cardiomyopathy    a. echo 6/16:  EF 35%, mild LVH, periapical HK, Gr 2 DD;  b.  Echo 7/16:  Mild LVH, mild focal basal septal hypertrophy, EF 60-65%, no RWMA, trivial AI, MAC, mild increased PASP, trivial effusion   Osteoporosis    Pneumonia    Psoriasis    UTI (lower urinary tract infection)     Past Surgical History:  Procedure Laterality Date   CARDIAC CATHETERIZATION N/A 05/22/2015   Procedure: Left Heart Cath and Coronary Angiography;  Surgeon: Victory LELON Sharps, MD;  Location: [facility] INVASIVE CV LAB;  Service: Cardiovascular;  Laterality: N/A;   CARPAL TUNNEL RELEASE     CORONARY ARTERY BYPASS GRAFT     CORONARY ARTERY BYPASS GRAFT N/A 05/22/2015   Procedure: CORONARY ARTERY BYPASS GRAFTING (CABG);  Surgeon: Elspeth BROCKS Millers, MD;  Location: [facility] OR;  Service: Open Heart Surgery;  Laterality: N/A;   TEE WITHOUT CARDIOVERSION  05/22/2015   Procedure: TRANSESOPHAGEAL ECHOCARDIOGRAM (TEE);  Surgeon: Elspeth BROCKS Millers, MD;  Location: [facility] OR;  Service: Open Heart Surgery;;   TONSILLECTOMY     TUBAL LIGATION  1971   WRIST FRACTURE SURGERY Bilateral 11/26/2008   Open reduction    Current  Outpatient Medications  Medication Sig Dispense Refill   albuterol  (VENTOLIN  HFA) 108 (90 Base) MCG/ACT inhaler Inhale 2 puffs into the lungs as needed for wheezing or shortness of breath. (Patient not taking: Reported on 11/13/2024)     Alirocumab  (PRALUENT ) 75 MG/ML SOAJ INJECT INTO THE SKIN EVERY 14 DAYS 6 mL 3   amLODipine  (NORVASC ) 5 MG tablet TAKE 1 TABLET BY MOUTH EVERY DAY 90 tablet 3   clobetasol  (OLUX ) 0.05 % topical foam Apply topically 2 (two) times daily.     Multiple Vitamins-Minerals (CENTRUM SILVER 50+WOMEN) TABS Take by mouth daily. (Patient not taking: Reported on 11/13/2024)     No current facility-administered medications for this visit.    Allergies as of 12/25/2024 - Review Complete 12/16/2024  Allergen Reaction Noted   Avelox [moxifloxacin] Shortness Of Breath and Other (See Comments) 08/24/2012   Humira [adalimumab] Other (See Comments) 04/24/2016   Lipitor [atorvastatin ] Other (See Comments) 08/12/2015   Ultram  [tramadol  hcl] Nausea Only 08/31/2016   Codeine Nausea And Vomiting and Rash 08/24/2012   Methotrexate and trimetrexate Nausea And Vomiting 05/23/2015   Sulfa antibiotics Nausea Only and Rash 08/24/2012   Statins Other (See Comments) 03/27/2017   Monosodium glutamate Other (See Comments) 04/24/2016   Sorbitol Diarrhea 04/24/2016  Family History  Problem Relation Age of Onset   Heart disease Father    Urolithiasis Father    Pancreatic disease Mother    Heart failure Mother    Liver cancer Mother    Cancer Paternal Grandmother    Heart attack Paternal Grandfather    Heart block Sister    Rheum arthritis Sister    Hypertension Sister    Stroke Sister    Prostate cancer Son    Arthritis Son        Psoriatic    Social History   Socioeconomic History   Marital status: Widowed    Spouse name: Not on file   Number of children: 3   Years of education: Assoc   Highest education level: Not on file  Occupational History   Occupation:  retired engineer, civil (consulting)  Tobacco Use   Smoking status: Former    Current packs/day: 0.00    Average packs/day: 0.5 packs/day for 50.0 years (25.0 ttl pk-yrs)    Types: Cigarettes    Start date: 05/22/1951    Quit date: 05/21/2001    Years since quitting: 23.6   Smokeless tobacco: Never  Vaping Use   Vaping status: Never Used  Substance and Sexual Activity   Alcohol use: No    Alcohol/week: 0.0 standard drinks of alcohol    Comment: 4x/yr   Drug use: No   Sexual activity: Not on file  Other Topics Concern   Not on file  Social History Narrative   Lives with her daughter, takes care of her own ADLs and iADLs   Right-handed   Caffeine: 3 cups per day   Social Drivers of Health   Tobacco Use: Medium Risk (12/16/2024)   Patient History    Smoking Tobacco Use: Former    Smokeless Tobacco Use: Never    Passive Exposure: Not on Actuary Strain: Not on file  Food Insecurity: Not on file  Transportation Needs: Not on file  Physical Activity: Not on file  Stress: Not on file  Social Connections: Not on file  Intimate Partner Violence: Not on file  Depression (PHQ2-9): Low Risk (09/09/2024)   Depression (PHQ2-9)    PHQ-2 Score: 0  Alcohol Screen: Not on file  Housing: Not on file  Utilities: Not on file  Health Literacy: Not on file    Review of Systems:    Constitutional: No weight loss, fever, chills, weakness or fatigue HEENT: Eyes: No change in vision               Ears, Nose, Throat:  No change in hearing or congestion Skin: No rash or itching Cardiovascular: No chest pain, chest pressure or palpitations   Respiratory: No SOB or cough Gastrointestinal: See HPI and otherwise negative Genitourinary: No dysuria or change in urinary frequency Neurological: No headache, dizziness or syncope Musculoskeletal: No new muscle or joint pain Hematologic: No bleeding or bruising Psychiatric: No history of depression or anxiety    Physical Exam:  Vital signs: There were  no vitals taken for this visit.  Constitutional: NAD, alert and cooperative Head:  Normocephalic and atraumatic. Eyes:   PEERL, EOMI. No icterus. Conjunctiva pink. Respiratory: Respirations even and unlabored. Lungs clear to auscultation bilaterally.   No wheezes, crackles, or rhonchi.  Cardiovascular:  Regular rate and rhythm. No peripheral edema, cyanosis or pallor.  Gastrointestinal:  Soft, nondistended, nontender. No rebound or guarding. Normal bowel sounds. No appreciable masses or hepatomegaly. Rectal:  Declines Msk:  Symmetrical without gross deformities.  Without edema, no deformity or joint abnormality.  Neurologic:  Alert and  oriented x4;  grossly normal neurologically.  Skin:   Dry and intact without significant lesions or rashes. Psychiatric: Oriented to person, place and time. Demonstrates good judgement and reason without abnormal affect or behaviors.  Physical Exam    RELEVANT LABS AND IMAGING: CBC    Component Value Date/Time   WBC 5.9 09/10/2024 0916   RBC 4.61 09/10/2024 0916   HGB 12.6 09/10/2024 0916   HGB 12.3 05/10/2020 0800   HCT 39.1 09/10/2024 0916   HCT 38.7 05/10/2020 0800   PLT 308 09/10/2024 0916   PLT 285 05/10/2020 0800   MCV 84.8 09/10/2024 0916   MCV 83 05/10/2020 0800   MCH 27.3 09/10/2024 0916   MCHC 32.2 09/10/2024 0916   RDW 12.4 09/10/2024 0916   RDW 12.6 05/10/2020 0800   LYMPHSABS 2.0 02/01/2023 1925   MONOABS 0.6 02/01/2023 1925   EOSABS 71 09/10/2024 0916   BASOSABS 12 09/10/2024 0916    CMP     Component Value Date/Time   NA 144 11/13/2024 1034   NA 143 10/12/2021 1003   K 4.1 11/13/2024 1034   CL 104 11/13/2024 1034   CO2 32 11/13/2024 1034   GLUCOSE 101 (H) 11/13/2024 1034   BUN 15 11/13/2024 1034   BUN 15 10/12/2021 1003   CREATININE 0.73 11/13/2024 1034   CREATININE 0.71 09/10/2024 0916   CALCIUM  9.2 11/13/2024 1034   PROT 7.2 09/10/2024 0916   PROT 6.9 11/13/2022 1100   ALBUMIN  4.1 11/13/2022 1100   AST 16  09/10/2024 0916   ALT 11 09/10/2024 0916   ALKPHOS 71 11/13/2022 1100   BILITOT 0.5 09/10/2024 0916   BILITOT 0.2 11/13/2022 1100   GFRNONAA >60 02/01/2023 1925   GFRNONAA 74 12/02/2015 1443   GFRAA 84 05/17/2020 0845   GFRAA 86 12/02/2015 1443     Assessment/Plan:   Diarrhea Fecal incontinence Normal CBC, CMP, TSH.  Infectious studies.. Ongoing intermittently since 2016 after being put on ASA for CAD. Colonoscopy greater than 10 years ago. Worsened after COVID and subsequent Paxlovid  treatment late summer 2025.   Some improvement since discontinuing aspirin .  4-6 loose BMs per day with some fecal incontinence and occasional explosive episodes.  This is very bothersome for her and concerns her as she has a family history of liver cancer, pancreatic cancer, stomach cancer.  She appears significantly younger than stated age and could be an adequate candidate for colonoscopy. Declines rectal exam. History of 3 childbirths. 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

## 2024-12-25 ENCOUNTER — Ambulatory Visit: Admitting: Gastroenterology

## 2024-12-25 ENCOUNTER — Encounter: Payer: Self-pay | Admitting: Gastroenterology

## 2024-12-25 VITALS — BP 114/60 | HR 88 | Ht 60.0 in | Wt 129.4 lb

## 2024-12-25 DIAGNOSIS — R159 Full incontinence of feces: Secondary | ICD-10-CM

## 2024-12-25 DIAGNOSIS — R194 Change in bowel habit: Secondary | ICD-10-CM

## 2024-12-25 DIAGNOSIS — R197 Diarrhea, unspecified: Secondary | ICD-10-CM | POA: Diagnosis not present

## 2024-12-25 DIAGNOSIS — I10 Essential (primary) hypertension: Secondary | ICD-10-CM

## 2024-12-25 NOTE — Patient Instructions (Addendum)
 A high fiber diet with plenty of fluids (up to 8 glasses of water daily) is suggested to relieve these symptoms.  benefiber, 1 tablespoon once or twice daily can be used to keep bowels regular if needed.   We will contact you to schedule your 3 month follow up with Nestor Blower, PA-C, as soon as her April schedule is opened.

## 2024-12-25 NOTE — Progress Notes (Signed)
 Noted.

## 2024-12-29 ENCOUNTER — Ambulatory Visit: Payer: Self-pay | Admitting: Physical Therapy

## 2025-01-05 ENCOUNTER — Ambulatory Visit: Payer: Self-pay | Admitting: Physical Therapy

## 2025-01-19 ENCOUNTER — Ambulatory Visit: Admitting: Cardiovascular Disease

## 2025-03-10 ENCOUNTER — Ambulatory Visit: Payer: Self-pay | Admitting: Family
